# Patient Record
Sex: Male | Born: 1955 | Race: Black or African American | Hispanic: No | Smoking: Former smoker
Health system: Southern US, Community
[De-identification: ages and names within clinical notes are randomized; demographics above are authoritative.]

## PROBLEM LIST (undated history)

## (undated) DIAGNOSIS — D75839 Thrombocytosis, unspecified: Secondary | ICD-10-CM

## (undated) DIAGNOSIS — J449 Chronic obstructive pulmonary disease, unspecified: Secondary | ICD-10-CM

## (undated) DIAGNOSIS — F1911 Other psychoactive substance abuse, in remission: Secondary | ICD-10-CM

## (undated) DIAGNOSIS — I2510 Atherosclerotic heart disease of native coronary artery without angina pectoris: Secondary | ICD-10-CM

## (undated) DIAGNOSIS — K635 Polyp of colon: Secondary | ICD-10-CM

## (undated) DIAGNOSIS — M549 Dorsalgia, unspecified: Secondary | ICD-10-CM

## (undated) DIAGNOSIS — I739 Peripheral vascular disease, unspecified: Secondary | ICD-10-CM

## (undated) DIAGNOSIS — D473 Essential (hemorrhagic) thrombocythemia: Secondary | ICD-10-CM

## (undated) DIAGNOSIS — R569 Unspecified convulsions: Secondary | ICD-10-CM

## (undated) HISTORY — DX: Other psychoactive substance abuse, in remission: F19.11

## (undated) HISTORY — PX: CORONARY ANGIOPLASTY WITH STENT PLACEMENT: SHX49

## (undated) HISTORY — DX: Chronic obstructive pulmonary disease, unspecified: J44.9

## (undated) HISTORY — DX: Thrombocytosis, unspecified: D75.839

## (undated) HISTORY — DX: Essential (hemorrhagic) thrombocythemia: D47.3

## (undated) HISTORY — PX: HAND SURGERY: SHX662

## (undated) HISTORY — DX: Peripheral vascular disease, unspecified: I73.9

## (undated) HISTORY — DX: Polyp of colon: K63.5

---

## 2004-03-01 ENCOUNTER — Emergency Department (HOSPITAL_COMMUNITY): Admission: EM | Admit: 2004-03-01 | Discharge: 2004-03-01 | Payer: Self-pay | Admitting: Emergency Medicine

## 2007-03-17 ENCOUNTER — Emergency Department (HOSPITAL_COMMUNITY): Admission: EM | Admit: 2007-03-17 | Discharge: 2007-03-17 | Payer: Self-pay | Admitting: Emergency Medicine

## 2007-07-25 ENCOUNTER — Emergency Department (HOSPITAL_COMMUNITY): Admission: EM | Admit: 2007-07-25 | Discharge: 2007-07-25 | Payer: Self-pay | Admitting: Emergency Medicine

## 2008-10-08 ENCOUNTER — Emergency Department (HOSPITAL_COMMUNITY): Admission: EM | Admit: 2008-10-08 | Discharge: 2008-10-08 | Payer: Self-pay | Admitting: Emergency Medicine

## 2009-06-02 ENCOUNTER — Inpatient Hospital Stay (HOSPITAL_COMMUNITY): Admission: EM | Admit: 2009-06-02 | Discharge: 2009-06-10 | Payer: Self-pay | Admitting: Emergency Medicine

## 2010-12-26 LAB — CBC
HCT: 35.3 % — ABNORMAL LOW (ref 39.0–52.0)
HCT: 41 % (ref 39.0–52.0)
Hemoglobin: 13.5 g/dL (ref 13.0–17.0)
MCV: 90.8 fL (ref 78.0–100.0)
Platelets: 228 10*3/uL (ref 150–400)
Platelets: 238 10*3/uL (ref 150–400)
RBC: 4.51 MIL/uL (ref 4.22–5.81)
RDW: 14.4 % (ref 11.5–15.5)
WBC: 7.1 10*3/uL (ref 4.0–10.5)
WBC: 8.7 10*3/uL (ref 4.0–10.5)

## 2010-12-26 LAB — MAGNESIUM
Magnesium: 1.8 mg/dL (ref 1.5–2.5)
Magnesium: 2 mg/dL (ref 1.5–2.5)

## 2010-12-26 LAB — BASIC METABOLIC PANEL
BUN: 4 mg/dL — ABNORMAL LOW (ref 6–23)
BUN: 8 mg/dL (ref 6–23)
CO2: 24 mEq/L (ref 19–32)
Chloride: 104 mEq/L (ref 96–112)
Chloride: 100 mEq/L (ref 96–112)
GFR calc Af Amer: >60 mL/min (ref >60)
GFR calc non Af Amer: >60 mL/min (ref >60)
GFR calc non Af Amer: >60 mL/min (ref >60)
Glucose, Bld: 107 mg/dL — ABNORMAL HIGH (ref 70–99)
Potassium: 4.1 mEq/L (ref 3.5–5.1)
Potassium: 3.8 mEq/L (ref 3.5–5.1)
Potassium: 4.1 mEq/L (ref 3.5–5.1)
Sodium: 135 mEq/L (ref 135–145)
Sodium: 135 mEq/L (ref 135–145)
Sodium: 136 mEq/L (ref 135–145)

## 2010-12-26 LAB — GLUCOSE, CAPILLARY: Glucose-Capillary: 153 mg/dL — ABNORMAL HIGH (ref 70–99)

## 2010-12-26 LAB — URINALYSIS, ROUTINE W REFLEX MICROSCOPIC
Ketones, ur: NEGATIVE mg/dL
Nitrite: NEGATIVE
Protein, ur: NEGATIVE mg/dL

## 2010-12-26 LAB — COMPREHENSIVE METABOLIC PANEL
ALT: 46 U/L (ref 0–53)
ALT: 65 U/L — ABNORMAL HIGH (ref 0–53)
AST: 139 U/L — ABNORMAL HIGH (ref 0–37)
Albumin: 3.4 g/dL — ABNORMAL LOW (ref 3.5–5.2)
Alkaline Phosphatase: 77 U/L (ref 39–117)
CO2: 27 mEq/L (ref 19–32)
Chloride: 98 mEq/L (ref 96–112)
Creatinine, Ser: 1.09 mg/dL (ref 0.4–1.5)
GFR calc Af Amer: >60 mL/min (ref >60)
GFR calc Af Amer: >60 mL/min (ref >60)
GFR calc non Af Amer: >60 mL/min (ref >60)
Glucose, Bld: 146 mg/dL — ABNORMAL HIGH (ref 70–99)
Potassium: 3.4 mEq/L — ABNORMAL LOW (ref 3.5–5.1)
Sodium: 132 mEq/L — ABNORMAL LOW (ref 135–145)
Total Bilirubin: 1 mg/dL (ref 0.3–1.2)
Total Protein: 7.4 g/dL (ref 6.0–8.3)

## 2010-12-26 LAB — RPR: RPR Ser Ql: NONREACTIVE

## 2010-12-26 LAB — HIV ANTIBODY (ROUTINE TESTING W REFLEX): HIV: NONREACTIVE

## 2010-12-26 LAB — PHENYTOIN LEVEL, TOTAL: Phenytoin Lvl: 21.5 ug/mL — ABNORMAL HIGH (ref 10.0–20.0)

## 2010-12-26 LAB — DIFFERENTIAL
Basophils Absolute: 0.1 10*3/uL (ref 0.0–0.1)
Eosinophils Absolute: 0 10*3/uL (ref 0.0–0.7)
Eosinophils Relative: 0 % (ref 0–5)
Lymphocytes Relative: 9 % — ABNORMAL LOW (ref 12–46)

## 2010-12-26 LAB — TSH: TSH: 4.32 u[IU]/mL (ref 0.350–4.500)

## 2010-12-26 LAB — LIPID PANEL
LDL Cholesterol: 112 mg/dL — ABNORMAL HIGH (ref 0–99)
Total CHOL/HDL Ratio: 1.9 RATIO
VLDL: 10 mg/dL (ref 0–40)

## 2010-12-26 LAB — RAPID URINE DRUG SCREEN, HOSP PERFORMED
Amphetamines: NOT DETECTED
Barbiturates: NOT DETECTED

## 2011-01-05 LAB — PHENYTOIN LEVEL, TOTAL: Phenytoin Lvl: <2.5 ug/mL — ABNORMAL LOW (ref 10.0–20.0)

## 2011-06-30 LAB — PHENYTOIN LEVEL, TOTAL: Phenytoin Lvl: <2.5 — ABNORMAL LOW

## 2011-07-08 LAB — DIFFERENTIAL
Basophils Absolute: 0
Lymphocytes Relative: 8 — ABNORMAL LOW
Lymphs Abs: 0.6 — ABNORMAL LOW
Neutro Abs: 7.4
Neutrophils Relative %: 88 — ABNORMAL HIGH

## 2011-07-08 LAB — CBC
Platelets: 292
RDW: 16.7 — ABNORMAL HIGH
WBC: 8.3

## 2011-07-08 LAB — BASIC METABOLIC PANEL
BUN: 10
Calcium: 10.3
GFR calc non Af Amer: 48 — ABNORMAL LOW
Glucose, Bld: 101 — ABNORMAL HIGH

## 2011-07-08 LAB — ETHANOL: Alcohol, Ethyl (B): <5

## 2011-07-08 LAB — PHENYTOIN LEVEL, TOTAL: Phenytoin Lvl: <2.5 — ABNORMAL LOW

## 2012-04-22 ENCOUNTER — Emergency Department (HOSPITAL_COMMUNITY): Payer: Medicaid Other

## 2012-04-22 ENCOUNTER — Encounter (HOSPITAL_COMMUNITY): Payer: Self-pay | Admitting: Emergency Medicine

## 2012-04-22 ENCOUNTER — Emergency Department (HOSPITAL_COMMUNITY)
Admission: EM | Admit: 2012-04-22 | Discharge: 2012-04-22 | Disposition: A | Discharge status: Home / Self Care | Payer: Medicaid Other | Attending: Emergency Medicine | Admitting: Emergency Medicine

## 2012-04-22 DIAGNOSIS — S62309A Unspecified fracture of unspecified metacarpal bone, initial encounter for closed fracture: Secondary | ICD-10-CM

## 2012-04-22 DIAGNOSIS — S62319A Displaced fracture of base of unspecified metacarpal bone, initial encounter for closed fracture: Secondary | ICD-10-CM | POA: Insufficient documentation

## 2012-04-22 DIAGNOSIS — W1789XA Other fall from one level to another, initial encounter: Secondary | ICD-10-CM | POA: Insufficient documentation

## 2012-04-22 DIAGNOSIS — S62102A Fracture of unspecified carpal bone, left wrist, initial encounter for closed fracture: Secondary | ICD-10-CM

## 2012-04-22 DIAGNOSIS — S52509A Unspecified fracture of the lower end of unspecified radius, initial encounter for closed fracture: Secondary | ICD-10-CM | POA: Insufficient documentation

## 2012-04-22 DIAGNOSIS — S52609A Unspecified fracture of lower end of unspecified ulna, initial encounter for closed fracture: Secondary | ICD-10-CM | POA: Insufficient documentation

## 2012-04-22 HISTORY — DX: Dorsalgia, unspecified: M54.9

## 2012-04-22 MED ORDER — HYDROMORPHONE HCL 4 MG PO TABS: 4.0000 mg | ORAL_TABLET | ORAL | Status: AC | PRN

## 2012-04-22 MED ORDER — HYDROMORPHONE HCL PF 1 MG/ML IJ SOLN
1.0000 mg | Freq: Once | INTRAMUSCULAR | Status: AC
  Administered 2012-04-22: 1 mg via INTRAMUSCULAR
  Filled 2012-04-22: qty 1

## 2012-04-22 NOTE — Progress Notes (Signed)
Orthopedic Tech Progress Note Patient Details:  Donald Leblanc 1956/07/04 [id number]  Ortho Devices Type of Ortho Device: Arm foam sling;Sugartong splint;Ace wrap Ortho Device/Splint Location: (L) UE Ortho Device/Splint Interventions: Application   [name] 04/22/2012, 6:28 PM

## 2012-04-22 NOTE — ED Notes (Addendum)
C/o L wrist pain, swelling, and deformity since falling off porch 20 min ago. CMS intact.

## 2012-04-22 NOTE — ED Provider Notes (Signed)
History  This chart was scribed for [employer]. Rubin Payor, MD by Erskine Emery. This patient was seen in room TR06C/TR06C and the patient's care was started at 17:21.   CSN: [id number]  Arrival date & time 04/22/12  1646   First MD Initiated Contact with Patient 04/22/12 1721      Chief Complaint  Patient presents with  . Wrist Pain    (Consider location/radiation/quality/duration/timing/severity/associated sxs/prior treatment) HPI Donald Leblanc is a 56 y.o. male who presents to the Emergency Department complaining of left wrist pain, swelling, and deformity since he fell off his porch 20 minutes ago. Pt reports he hit his leg and wrist upon falling but did not hit his head. Pt denies any h/o smoking, drinking, or other injuries.  CMS is intact.   Past Medical History  Diagnosis Date  . Back pain     History reviewed. No pertinent past surgical history.  No family history on file.  History  Substance Use Topics  . Smoking status: Never Smoker   . Smokeless tobacco: Not on file  . Alcohol Use: No      Review of Systems  Constitutional: Negative for fever and chills.  Respiratory: Negative for shortness of breath.   Gastrointestinal: Negative for nausea and vomiting.  Musculoskeletal: Positive for joint swelling.       Left wrist pain, swelling, and deformity.  Neurological: Negative for weakness.    Allergies  Aspirin  Home Medications   Current Outpatient Rx  Name Route Sig Dispense Refill  . HYDROCODONE-ACETAMINOPHEN 10-325 MG PO TABS Oral Take 1 tablet by mouth every 6 (six) hours as needed. For pain    . PHENYTOIN SODIUM EXTENDED 100 MG PO CAPS Oral Take 100 mg by mouth 3 (three) times daily.    Marland Kitchen SIMVASTATIN PO Oral Take 1 tablet by mouth at bedtime.    Marland Kitchen HYDROMORPHONE HCL 4 MG PO TABS Oral Take 1 tablet (4 mg total) by mouth every 4 (four) hours as needed for pain. 20 tablet 0    BP 116/62  Pulse 87  Temp 98 F (36.7 C) (Oral)  Resp 20  SpO2  95%  Physical Exam  Nursing note and vitals reviewed. Constitutional: He is oriented to person, place, and time. He appears well-developed and well-nourished. No distress.  HENT:  Head: Normocephalic and atraumatic.  Eyes: EOM are normal.  Neck: Neck supple. No tracheal deviation present.  Cardiovascular: Normal rate and regular rhythm.   Pulmonary/Chest: Effort normal. No respiratory distress.  Musculoskeletal: Normal range of motion.       Deformity on distal radial wrist. No shoulder tenderness. Strong radial pulse.   Neurological: He is alert and oriented to person, place, and time.  Skin: Skin is warm and dry.       Abrasion to the left elbow.   Psychiatric: He has a normal mood and affect. His behavior is normal.    ED Course  Procedures (including critical care time) DIAGNOSTIC STUDIES: Oxygen Saturation is 95% on room air, adequate by my interpretation.    COORDINATION OF CARE: 17:37--I evaluated the patient and informed him of the fractures present on his x-rays. We discussed a treatment plan including discharge and possible surgery to which the pt agreed.   17:50--I consulted with Dr. Dairl Ponder, orthopaedic surgeon at the [facility], about the pt's fractures and potential surgery.   17:55-I rechecked the pt and told him I would prescribe him something for his pain before discharge. I instructed the pt to  follow up with Dr. Mina Marble at the [facility] in [place].   19:04--I rechecked the pt again and made sure he understood that he needs to follow up with Dr. Mina Marble for surgery.   Labs Reviewed - No data to display Dg Wrist Complete Left  04/22/2012  *RADIOLOGY REPORT*  Clinical Data: History of injury with wrist pain.  LEFT WRIST - COMPLETE 3+ VIEW  Comparison: None.  Findings: There is fracture of the distal metaphysis and epiphysis of the radius with extension to involve the articular surface of the distal radius at the radiocarpal joint.  There is proximal  and dorsal and radial displacement of the major distal fracture fragment.  There is dorsal angulation of the major distal fracture fragment.  There is also a comminuted fracture of the ulnar styloid with slight distal and radial displacement of the small fracture fragments.  There is also fracture of the proximal diaphysis of the third metacarpal at junction with metaphysis.  There is apposition at the fracture site with near anatomic alignment.  No scaphoid fracture is evident.  No dislocation is seen.  IMPRESSION: Fracture of the distal radius.  There is involvement of the articular surface of the radiocarpal joint.  Fracture of the ulnar styloid.  Fracture of the diaphysis of the third metacarpal.  Original Report Authenticated By: Crawford Givens, M.D.     1. Wrist fracture, left   2. Metacarpal bone fracture       MDM   Patient with a fall and left distal radius fracture with intra-articular old involvement. Also ulnar styloid fracture. Also third metacarpal fracture. Patient was immobilized in the ER and discussion with Dr. Mina Marble. Patient will see him in the office on Tuesday and will likely require surgery. Patient feels better after pain medicines and discharged home.   I personally performed the services described in this documentation, which was scribed in my presence. The recorded information has been reviewed and considered.           Juliet Rude. Rubin Payor, MD 04/22/12 [phone]

## 2012-04-22 NOTE — ED Notes (Signed)
Unable to locate pt in triage, wait

## 2012-04-28 ENCOUNTER — Encounter (HOSPITAL_COMMUNITY): Payer: Self-pay | Admitting: Emergency Medicine

## 2012-04-28 ENCOUNTER — Emergency Department (HOSPITAL_COMMUNITY)
Admission: EM | Admit: 2012-04-28 | Discharge: 2012-04-28 | Disposition: A | Discharge status: Home / Self Care | Payer: Medicaid Other | Attending: Emergency Medicine | Admitting: Emergency Medicine

## 2012-04-28 DIAGNOSIS — M25539 Pain in unspecified wrist: Secondary | ICD-10-CM

## 2012-04-28 DIAGNOSIS — Z886 Allergy status to analgesic agent status: Secondary | ICD-10-CM | POA: Insufficient documentation

## 2012-04-28 DIAGNOSIS — Z9889 Other specified postprocedural states: Secondary | ICD-10-CM

## 2012-04-28 DIAGNOSIS — Z4789 Encounter for other orthopedic aftercare: Secondary | ICD-10-CM | POA: Insufficient documentation

## 2012-04-28 MED ORDER — HYDROMORPHONE HCL PF 2 MG/ML IJ SOLN
2.0000 mg | Freq: Once | INTRAMUSCULAR | Status: AC
  Administered 2012-04-28: 2 mg via INTRAMUSCULAR
  Filled 2012-04-28: qty 1

## 2012-04-28 MED ORDER — HYDROMORPHONE HCL 4 MG PO TABS: 4.0000 mg | ORAL_TABLET | ORAL | Status: AC | PRN

## 2012-04-28 NOTE — ED Notes (Signed)
AOZ:HY86<VH> Expected date:04/28/12<BR> Expected time: 3:48 AM<BR> Means of arrival:Ambulance<BR> Comments:<BR> S/P wrist surgery uncontrolled pain

## 2012-04-28 NOTE — ED Notes (Signed)
Pt fingers w/ moderate swelling, warm to touch, capillary refilled ~ 3 seconds. Pt states has kept arm elevated since discharge from hospital yesterday afternoon, got home post-op ~ 1600.

## 2012-04-28 NOTE — ED Provider Notes (Signed)
History     CSN: [id number]  Arrival date & time 04/28/12  0401   First MD Initiated Contact with Patient 04/28/12 [phone]      Chief Complaint  Patient presents with  . Wrist Pain    ORIF left wrist 04/27/12    (Consider location/radiation/quality/duration/timing/severity/associated sxs/prior treatment) HPI Comments: The patient fell off of his front porch yesterday evening and broke wrist.  He was seen at [facility] where he had an orif of the fracture.  He is taking his percocet but is not getting any relief.  Called 911.    Patient is a 56 y.o. male presenting with wrist pain. The history is provided by the patient.  Wrist Pain This is a new problem. The problem occurs constantly. The problem has been gradually worsening. Nothing aggravates the symptoms. Nothing relieves the symptoms. Treatments tried: pain meds  The treatment provided no relief.    Past Medical History  Diagnosis Date  . Back pain     History reviewed. No pertinent past surgical history.  No family history on file.  History  Substance Use Topics  . Smoking status: Never Smoker   . Smokeless tobacco: Not on file  . Alcohol Use: No      Review of Systems  All other systems reviewed and are negative.    Allergies  Aspirin  Home Medications   Current Outpatient Rx  Name Route Sig Dispense Refill  . HYDROMORPHONE HCL 4 MG PO TABS Oral Take 1 tablet (4 mg total) by mouth every 4 (four) hours as needed for pain. 20 tablet 0  . OXYCODONE-ACETAMINOPHEN 5-325 MG PO TABS Oral Take 1 tablet by mouth every 6 (six) hours as needed. Post op pain    . PHENYTOIN SODIUM EXTENDED 100 MG PO CAPS Oral Take 100 mg by mouth 3 (three) times daily.    Marland Kitchen SIMVASTATIN PO Oral Take 1 tablet by mouth at bedtime.      BP 178/94  Pulse 103  Resp 16  SpO2 99%  Physical Exam  Nursing note and vitals reviewed. Constitutional: He is oriented to person, place, and time. He appears well-developed and well-nourished. No distress.   HENT:  Head: Normocephalic and atraumatic.  Neck: Normal range of motion. Neck supple.  Musculoskeletal:       There is a volar splint with web roll and ace on the left hand.  The splint is not circumferential and does not appear to be too tight.  Cap refill is brisk in the fingers and motor and sensation are intact.  Neurological: He is alert and oriented to person, place, and time.  Skin: Skin is warm and dry. He is not diaphoretic.    ED Course  Procedures (including critical care time)  Labs Reviewed - No data to display No results found.   No diagnosis found.    MDM  There are no signs of compartment syndrome.  The splint is not circumferential and does not appear to be too tight.  He will be given dilaudid im for pain control and discharged to home.        Geoffery Lyons, MD 04/28/12 [phone]

## 2012-04-28 NOTE — ED Notes (Signed)
Pt post op from 04/27/12, taking po pain meds at home yesterday evening. Took one oxycodone at 1830, and two at 2245. States is unable to get sufficient pain control so called EMS

## 2012-05-05 ENCOUNTER — Ambulatory Visit: Payer: Medicaid Other | Attending: Orthopedic Surgery | Admitting: Occupational Therapy

## 2012-05-05 DIAGNOSIS — IMO0001 Reserved for inherently not codable concepts without codable children: Secondary | ICD-10-CM | POA: Insufficient documentation

## 2012-05-05 DIAGNOSIS — M25539 Pain in unspecified wrist: Secondary | ICD-10-CM | POA: Insufficient documentation

## 2012-05-05 DIAGNOSIS — M25549 Pain in joints of unspecified hand: Secondary | ICD-10-CM | POA: Insufficient documentation

## 2012-05-12 ENCOUNTER — Encounter: Payer: Medicaid Other | Admitting: Occupational Therapy

## 2013-07-14 ENCOUNTER — Encounter (HOSPITAL_COMMUNITY): Payer: Self-pay | Admitting: Emergency Medicine

## 2013-07-14 ENCOUNTER — Emergency Department (HOSPITAL_COMMUNITY)
Admission: EM | Admit: 2013-07-14 | Discharge: 2013-07-14 | Disposition: A | Discharge status: Home / Self Care | Payer: Medicaid Other | Attending: Emergency Medicine | Admitting: Emergency Medicine

## 2013-07-14 DIAGNOSIS — Y838 Other surgical procedures as the cause of abnormal reaction of the patient, or of later complication, without mention of misadventure at the time of the procedure: Secondary | ICD-10-CM | POA: Insufficient documentation

## 2013-07-14 DIAGNOSIS — T148XXA Other injury of unspecified body region, initial encounter: Secondary | ICD-10-CM

## 2013-07-14 DIAGNOSIS — Z79899 Other long term (current) drug therapy: Secondary | ICD-10-CM | POA: Insufficient documentation

## 2013-07-14 DIAGNOSIS — I2510 Atherosclerotic heart disease of native coronary artery without angina pectoris: Secondary | ICD-10-CM | POA: Insufficient documentation

## 2013-07-14 DIAGNOSIS — Z9861 Coronary angioplasty status: Secondary | ICD-10-CM | POA: Insufficient documentation

## 2013-07-14 DIAGNOSIS — T792XXA Traumatic secondary and recurrent hemorrhage and seroma, initial encounter: Secondary | ICD-10-CM

## 2013-07-14 DIAGNOSIS — Z8739 Personal history of other diseases of the musculoskeletal system and connective tissue: Secondary | ICD-10-CM | POA: Insufficient documentation

## 2013-07-14 DIAGNOSIS — G40909 Epilepsy, unspecified, not intractable, without status epilepticus: Secondary | ICD-10-CM | POA: Insufficient documentation

## 2013-07-14 DIAGNOSIS — IMO0002 Reserved for concepts with insufficient information to code with codable children: Secondary | ICD-10-CM | POA: Insufficient documentation

## 2013-07-14 HISTORY — DX: Atherosclerotic heart disease of native coronary artery without angina pectoris: I25.10

## 2013-07-14 HISTORY — DX: Unspecified convulsions: R56.9

## 2013-07-14 NOTE — ED Notes (Signed)
Pt alert and mentating appropriately upon d/c. Applied gauze to groin area per MD order. Pt given d/c teaching and follow up care instructions. Pt verbalizes understanding and has no further questions upon d/c. Pt ambulatory upon d/c with NAD noted upon d/c. Pt leaving with wife and paperwork.

## 2013-07-14 NOTE — ED Notes (Signed)
Pt states had cath last Thursday and noticed bleeding from site this morning. Pt denies dizziness and lightheadedness. Pt denies fever and n/v/d. Pt denies chest pain and SOB. Pt sore to touch at site. Bleeding noted.

## 2013-07-14 NOTE — ED Notes (Signed)
Dr Loretha Stapler at bedside with ultrasound

## 2013-07-14 NOTE — ED Provider Notes (Signed)
CSN: [id number]     Arrival date & time 07/14/13  4098 History   First MD Initiated Contact with Patient 07/14/13 [phone]     Chief Complaint  Patient presents with  . bleeding from cath site    (Consider location/radiation/quality/duration/timing/severity/associated sxs/prior Treatment) HPI Comments: 57 yo male with recent right lower extremity arterial stenting from a left femoral artery approach presenting with a small amount of bleeding from his left groin which started this morning.  Surgery was 8 days ago.  Denies pain at site, purulent discharge, leg pain in either leg, fevers, or other systemic symptoms.  Nothing has made bleeding better.  Walking seems to make it worse.     Past Medical History  Diagnosis Date  . Back pain   . Seizures   . Coronary artery disease    Past Surgical History  Procedure Laterality Date  . Coronary angioplasty with stent placement     History reviewed. No pertinent family history. History  Substance Use Topics  . Smoking status: Never Smoker   . Smokeless tobacco: Not on file  . Alcohol Use: No    Review of Systems  Constitutional: Negative for fever.  HENT: Negative for congestion.   Respiratory: Negative for cough and shortness of breath.   Cardiovascular: Negative for chest pain.  Gastrointestinal: Negative for nausea, vomiting, abdominal pain and diarrhea.  All other systems reviewed and are negative.    Allergies  Aspirin  Home Medications   Current Outpatient Rx  Name  Route  Sig  Dispense  Refill  . oxyCODONE-acetaminophen (PERCOCET/ROXICET) 5-325 MG per tablet   Oral   Take 1 tablet by mouth every 6 (six) hours as needed. Post op pain         . phenytoin (DILANTIN) 100 MG ER capsule   Oral   Take 100 mg by mouth 3 (three) times daily.         Marland Kitchen SIMVASTATIN PO   Oral   Take 1 tablet by mouth at bedtime.          BP 137/70  Pulse 71  Temp(Src) 97.7 F (36.5 C) (Oral)  Resp 16  SpO2 98% Physical Exam   Nursing note and vitals reviewed. Constitutional: He is oriented to person, place, and time. He appears well-developed and well-nourished. No distress.  HENT:  Head: Normocephalic and atraumatic.  Eyes: Conjunctivae are normal. No scleral icterus.  Neck: Neck supple.  Cardiovascular: Normal rate and intact distal pulses.   Pulmonary/Chest: Effort normal. No stridor. No respiratory distress.  Abdominal: Normal appearance. He exhibits no distension.  Musculoskeletal:  Punctate wound in left inguinal crease from which red blood can be expressed.  Superior to this area is a small, mildly tender mass.  No fluctuance or surrounding erythema.  No induration.  Femoral pulses are difficult to palpate bilaterally.  2+ DP pulses bilaterally.    Neurological: He is alert and oriented to person, place, and time.  Skin: Skin is warm and dry. No rash noted.  Psychiatric: He has a normal mood and affect. His behavior is normal.    ED Course  Procedures (including critical care time)  Labs Review Labs Reviewed - No data to display Imaging Review No results found.  EKG Interpretation   None       MDM   1. Bleeding from wound, initial encounter   2. Hematoma    57 yo male with recent left femoral groin stick 1 week ago, now with small amount of bleeding  from puncture site.  He did a lot of walking yesterday, which maybe prompted bleeding.  He has what appears to be a small hematoma superior to the puncture wound.  He has no signs of infection, local or systemic.  Limited bedside ultrasound confirmed good blood flow through femoral artery.  I discussed case with Dr. Eben Burow, his Vascular Surgeron, via telephone.  He and I do not see an indication for further emergent workup, but pt will follow up for an ultrasound in a few days.  He was given return precautions including worsening bleeding, leg pain, signs of infection, etc.      Candyce Churn, MD 07/14/13 1606

## 2014-09-16 ENCOUNTER — Emergency Department (HOSPITAL_COMMUNITY)
Admission: EM | Admit: 2014-09-16 | Discharge: 2014-09-16 | Disposition: A | Discharge status: Home / Self Care | Payer: Medicaid Other | Attending: Emergency Medicine | Admitting: Emergency Medicine

## 2014-09-16 ENCOUNTER — Encounter (HOSPITAL_COMMUNITY): Payer: Self-pay | Admitting: Nurse Practitioner

## 2014-09-16 DIAGNOSIS — G40909 Epilepsy, unspecified, not intractable, without status epilepticus: Secondary | ICD-10-CM | POA: Insufficient documentation

## 2014-09-16 DIAGNOSIS — Y9389 Activity, other specified: Secondary | ICD-10-CM | POA: Diagnosis not present

## 2014-09-16 DIAGNOSIS — I2510 Atherosclerotic heart disease of native coronary artery without angina pectoris: Secondary | ICD-10-CM | POA: Insufficient documentation

## 2014-09-16 DIAGNOSIS — G8929 Other chronic pain: Secondary | ICD-10-CM | POA: Diagnosis not present

## 2014-09-16 DIAGNOSIS — S39012A Strain of muscle, fascia and tendon of lower back, initial encounter: Secondary | ICD-10-CM | POA: Diagnosis not present

## 2014-09-16 DIAGNOSIS — Z79899 Other long term (current) drug therapy: Secondary | ICD-10-CM | POA: Diagnosis not present

## 2014-09-16 DIAGNOSIS — Y9289 Other specified places as the place of occurrence of the external cause: Secondary | ICD-10-CM | POA: Insufficient documentation

## 2014-09-16 DIAGNOSIS — Y998 Other external cause status: Secondary | ICD-10-CM | POA: Insufficient documentation

## 2014-09-16 DIAGNOSIS — S3992XA Unspecified injury of lower back, initial encounter: Secondary | ICD-10-CM | POA: Diagnosis present

## 2014-09-16 DIAGNOSIS — Z9861 Coronary angioplasty status: Secondary | ICD-10-CM | POA: Diagnosis not present

## 2014-09-16 DIAGNOSIS — Z8739 Personal history of other diseases of the musculoskeletal system and connective tissue: Secondary | ICD-10-CM | POA: Insufficient documentation

## 2014-09-16 MED ORDER — METHOCARBAMOL 500 MG PO TABS: 500.0000 mg | ORAL_TABLET | Freq: Two times a day (BID) | ORAL | Status: DC
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
## 2014-09-16 NOTE — Discharge Instructions (Signed)
You have been diagnosed with having low back strain from recent injury.  Please follow instruction below for further care.    Low Back Strain with Rehab A strain is an injury in which a tendon or muscle is torn. The muscles and tendons of the lower back are vulnerable to strains. However, these muscles and tendons are very strong and require a great force to be injured. Strains are classified into three categories. Grade 1 strains cause pain, but the tendon is not lengthened. Grade 2 strains include a lengthened ligament, due to the ligament being stretched or partially ruptured. With grade 2 strains there is still function, although the function may be decreased. Grade 3 strains involve a complete tear of the tendon or muscle, and function is usually impaired. SYMPTOMS   Pain in the lower back.  Pain that affects one side more than the other.  Pain that gets worse with movement and may be felt in the hip, buttocks, or back of the thigh.  Muscle spasms of the muscles in the back.  Swelling along the muscles of the back.  Loss of strength of the back muscles.  Crackling sound (crepitation) when the muscles are touched. CAUSES  Lower back strains occur when a force is placed on the muscles or tendons that is greater than they can handle. Common causes of injury include:  Prolonged overuse of the muscle-tendon units in the lower back, usually from incorrect posture.  A single violent injury or force applied to the back. RISK INCREASES WITH:  Sports that involve twisting forces on the spine or a lot of bending at the waist (football, rugby, weightlifting, bowling, golf, tennis, speed skating, racquetball, swimming, running, gymnastics, diving).  Poor strength and flexibility.  Failure to warm up properly before activity.  Family history of lower back pain or disk disorders.  Previous back injury or surgery (especially fusion).  Poor posture with lifting, especially heavy  objects.  Prolonged sitting, especially with poor posture. PREVENTION   Learn and use proper posture when sitting or lifting (maintain proper posture when sitting, lift using the knees and legs, not at the waist).  Warm up and stretch properly before activity.  Allow for adequate recovery between workouts.  Maintain physical fitness:  Strength, flexibility, and endurance.  Cardiovascular fitness. PROGNOSIS  If treated properly, lower back strains usually heal within 6 weeks. RELATED COMPLICATIONS   Recurring symptoms, resulting in a chronic problem.  Chronic inflammation, scarring, and partial muscle-tendon tear.  Delayed healing or resolution of symptoms.  Prolonged disability. TREATMENT  Treatment first involves the use of ice and medicine, to reduce pain and inflammation. The use of strengthening and stretching exercises may help reduce pain with activity. These exercises may be performed at home or with a therapist. Severe injuries may require referral to a therapist for further evaluation and treatment, such as ultrasound. Your caregiver may advise that you wear a back brace or corset, to help reduce pain and discomfort. Often, prolonged bed rest results in greater harm then benefit. Corticosteroid injections may be recommended. However, these should be reserved for the most serious cases. It is important to avoid using your back when lifting objects. At night, sleep on your back on a firm mattress with a pillow placed under your knees. If non-surgical treatment is unsuccessful, surgery may be needed.  MEDICATION   If pain medicine is needed, nonsteroidal anti-inflammatory medicines (aspirin and ibuprofen), or other minor pain relievers (acetaminophen), are often advised.  Do not take pain  medicine for 7 days before surgery.  Prescription pain relievers may be given, if your caregiver thinks they are needed. Use only as directed and only as much as you need.  Ointments  applied to the skin may be helpful.  Corticosteroid injections may be given by your caregiver. These injections should be reserved for the most serious cases, because they may only be given a certain number of times. HEAT AND COLD  Cold treatment (icing) should be applied for 10 to 15 minutes every 2 to 3 hours for inflammation and pain, and immediately after activity that aggravates your symptoms. Use ice packs or an ice massage.  Heat treatment may be used before performing stretching and strengthening activities prescribed by your caregiver, physical therapist, or athletic trainer. Use a heat pack or a warm water soak. SEEK MEDICAL CARE IF:   Symptoms get worse or do not improve in 2 to 4 weeks, despite treatment.  You develop numbness, weakness, or loss of bowel or bladder function.  New, unexplained symptoms develop. (Drugs used in treatment may produce side effects.) EXERCISES  RANGE OF MOTION (ROM) AND STRETCHING EXERCISES - Low Back Strain Most people with lower back pain will find that their symptoms get worse with excessive bending forward (flexion) or arching at the lower back (extension). The exercises which will help resolve your symptoms will focus on the opposite motion.  Your physician, physical therapist or athletic trainer will help you determine which exercises will be most helpful to resolve your lower back pain. Do not complete any exercises without first consulting with your caregiver. Discontinue any exercises which make your symptoms worse until you speak to your caregiver.  If you have pain, numbness or tingling which travels down into your buttocks, leg or foot, the goal of the therapy is for these symptoms to move closer to your back and eventually resolve. Sometimes, these leg symptoms will get better, but your lower back pain may worsen. This is typically an indication of progress in your rehabilitation. Be very alert to any changes in your symptoms and the activities  in which you participated in the 24 hours prior to the change. Sharing this information with your caregiver will allow him/her to most efficiently treat your condition.  These exercises may help you when beginning to rehabilitate your injury. Your symptoms may resolve with or without further involvement from your physician, physical therapist or athletic trainer. While completing these exercises, remember:  Restoring tissue flexibility helps normal motion to return to the joints. This allows healthier, less painful movement and activity.  An effective stretch should be held for at least 30 seconds.  A stretch should never be painful. You should only feel a gentle lengthening or release in the stretched tissue. FLEXION RANGE OF MOTION AND STRETCHING EXERCISES: STRETCH - Flexion, Single Knee to Chest   Lie on a firm bed or floor with both legs extended in front of you.  Keeping one leg in contact with the floor, bring your opposite knee to your chest. Hold your leg in place by either grabbing behind your thigh or at your knee.  Pull until you feel a gentle stretch in your lower back. Hold __________ seconds.  Slowly release your grasp and repeat the exercise with the opposite side. Repeat __________ times. Complete this exercise __________ times per day.  STRETCH - Flexion, Double Knee to Chest   Lie on a firm bed or floor with both legs extended in front of you.  Keeping one leg  in contact with the floor, bring your opposite knee to your chest.  Tense your stomach muscles to support your back and then lift your other knee to your chest. Hold your legs in place by either grabbing behind your thighs or at your knees.  Pull both knees toward your chest until you feel a gentle stretch in your lower back. Hold __________ seconds.  Tense your stomach muscles and slowly return one leg at a time to the floor. Repeat __________ times. Complete this exercise __________ times per day.  STRETCH -  Low Trunk Rotation  Lie on a firm bed or floor. Keeping your legs in front of you, bend your knees so they are both pointed toward the ceiling and your feet are flat on the floor.  Extend your arms out to the side. This will stabilize your upper body by keeping your shoulders in contact with the floor.  Gently and slowly drop both knees together to one side until you feel a gentle stretch in your lower back. Hold for __________ seconds.  Tense your stomach muscles to support your lower back as you bring your knees back to the starting position. Repeat the exercise to the other side. Repeat __________ times. Complete this exercise __________ times per day  EXTENSION RANGE OF MOTION AND FLEXIBILITY EXERCISES: STRETCH - Extension, Prone on Elbows   Lie on your stomach on the floor, a bed will be too soft. Place your palms about shoulder width apart and at the height of your head.  Place your elbows under your shoulders. If this is too painful, stack pillows under your chest.  Allow your body to relax so that your hips drop lower and make contact more completely with the floor.  Hold this position for __________ seconds.  Slowly return to lying flat on the floor. Repeat __________ times. Complete this exercise __________ times per day.  RANGE OF MOTION - Extension, Prone Press Ups  Lie on your stomach on the floor, a bed will be too soft. Place your palms about shoulder width apart and at the height of your head.  Keeping your back as relaxed as possible, slowly straighten your elbows while keeping your hips on the floor. You may adjust the placement of your hands to maximize your comfort. As you gain motion, your hands will come more underneath your shoulders.  Hold this position __________ seconds.  Slowly return to lying flat on the floor. Repeat __________ times. Complete this exercise __________ times per day.  RANGE OF MOTION- Quadruped, Neutral Spine   Assume a hands and knees  position on a firm surface. Keep your hands under your shoulders and your knees under your hips. You may place padding under your knees for comfort.  Drop your head and point your tail bone toward the ground below you. This will round out your lower back like an angry cat. Hold this position for __________ seconds.  Slowly lift your head and release your tail bone so that your back sags into a large arch, like an old horse.  Hold this position for __________ seconds.  Repeat this until you feel limber in your lower back.  Now, find your "sweet spot." This will be the most comfortable position somewhere between the two previous positions. This is your neutral spine. Once you have found this position, tense your stomach muscles to support your lower back.  Hold this position for __________ seconds. Repeat __________ times. Complete this exercise __________ times per day.  STRENGTHENING EXERCISES -  Low Back Strain These exercises may help you when beginning to rehabilitate your injury. These exercises should be done near your "sweet spot." This is the neutral, low-back arch, somewhere between fully rounded and fully arched, that is your least painful position. When performed in this safe range of motion, these exercises can be used for people who have either a flexion or extension based injury. These exercises may resolve your symptoms with or without further involvement from your physician, physical therapist or athletic trainer. While completing these exercises, remember:   Muscles can gain both the endurance and the strength needed for everyday activities through controlled exercises.  Complete these exercises as instructed by your physician, physical therapist or athletic trainer. Increase the resistance and repetitions only as guided.  You may experience muscle soreness or fatigue, but the pain or discomfort you are trying to eliminate should never worsen during these exercises. If this pain  does worsen, stop and make certain you are following the directions exactly. If the pain is still present after adjustments, discontinue the exercise until you can discuss the trouble with your caregiver. STRENGTHENING - Deep Abdominals, Pelvic Tilt  Lie on a firm bed or floor. Keeping your legs in front of you, bend your knees so they are both pointed toward the ceiling and your feet are flat on the floor.  Tense your lower abdominal muscles to press your lower back into the floor. This motion will rotate your pelvis so that your tail bone is scooping upwards rather than pointing at your feet or into the floor.  With a gentle tension and even breathing, hold this position for __________ seconds. Repeat __________ times. Complete this exercise __________ times per day.  STRENGTHENING - Abdominals, Crunches   Lie on a firm bed or floor. Keeping your legs in front of you, bend your knees so they are both pointed toward the ceiling and your feet are flat on the floor. Cross your arms over your chest.  Slightly tip your chin down without bending your neck.  Tense your abdominals and slowly lift your trunk high enough to just clear your shoulder blades. Lifting higher can put excessive stress on the lower back and does not further strengthen your abdominal muscles.  Control your return to the starting position. Repeat __________ times. Complete this exercise __________ times per day.  STRENGTHENING - Quadruped, Opposite UE/LE Lift   Assume a hands and knees position on a firm surface. Keep your hands under your shoulders and your knees under your hips. You may place padding under your knees for comfort.  Find your neutral spine and gently tense your abdominal muscles so that you can maintain this position. Your shoulders and hips should form a rectangle that is parallel with the floor and is not twisted.  Keeping your trunk steady, lift your right hand no higher than your shoulder and then your  left leg no higher than your hip. Make sure you are not holding your breath. Hold this position __________ seconds.  Continuing to keep your abdominal muscles tense and your back steady, slowly return to your starting position. Repeat with the opposite arm and leg. Repeat __________ times. Complete this exercise __________ times per day.  STRENGTHENING - Lower Abdominals, Double Knee Lift  Lie on a firm bed or floor. Keeping your legs in front of you, bend your knees so they are both pointed toward the ceiling and your feet are flat on the floor.  Tense your abdominal muscles to brace your  lower back and slowly lift both of your knees until they come over your hips. Be certain not to hold your breath.  Hold __________ seconds. Using your abdominal muscles, return to the starting position in a slow and controlled manner. Repeat __________ times. Complete this exercise __________ times per day.  POSTURE AND BODY MECHANICS CONSIDERATIONS - Low Back Strain Keeping correct posture when sitting, standing or completing your activities will reduce the stress put on different body tissues, allowing injured tissues a chance to heal and limiting painful experiences. The following are general guidelines for improved posture. Your physician or physical therapist will provide you with any instructions specific to your needs. While reading these guidelines, remember:  The exercises prescribed by your provider will help you have the flexibility and strength to maintain correct postures.  The correct posture provides the best environment for your joints to work. All of your joints have less wear and tear when properly supported by a spine with good posture. This means you will experience a healthier, less painful body.  Correct posture must be practiced with all of your activities, especially prolonged sitting and standing. Correct posture is as important when doing repetitive low-stress activities (typing) as it  is when doing a single heavy-load activity (lifting). RESTING POSITIONS Consider which positions are most painful for you when choosing a resting position. If you have pain with flexion-based activities (sitting, bending, stooping, squatting), choose a position that allows you to rest in a less flexed posture. You would want to avoid curling into a fetal position on your side. If your pain worsens with extension-based activities (prolonged standing, working overhead), avoid resting in an extended position such as sleeping on your stomach. Most people will find more comfort when they rest with their spine in a more neutral position, neither too rounded nor too arched. Lying on a non-sagging bed on your side with a pillow between your knees, or on your back with a pillow under your knees will often provide some relief. Keep in mind, being in any one position for a prolonged period of time, no matter how correct your posture, can still lead to stiffness. PROPER SITTING POSTURE In order to minimize stress and discomfort on your spine, you must sit with correct posture. Sitting with good posture should be effortless for a healthy body. Returning to good posture is a gradual process. Many people can work toward this most comfortably by using various supports until they have the flexibility and strength to maintain this posture on their own. When sitting with proper posture, your ears will fall over your shoulders and your shoulders will fall over your hips. You should use the back of the chair to support your upper back. Your lower back will be in a neutral position, just slightly arched. You may place a small pillow or folded towel at the base of your lower back for support.  When working at a desk, create an environment that supports good, upright posture. Without extra support, muscles tire, which leads to excessive strain on joints and other tissues. Keep these recommendations in mind: CHAIR:  A chair should  be able to slide under your desk when your back makes contact with the back of the chair. This allows you to work closely.  The chair's height should allow your eyes to be level with the upper part of your monitor and your hands to be slightly lower than your elbows. BODY POSITION  Your feet should make contact with the floor. If  this is not possible, use a foot rest.  Keep your ears over your shoulders. This will reduce stress on your neck and lower back. INCORRECT SITTING POSTURES  If you are feeling tired and unable to assume a healthy sitting posture, do not slouch or slump. This puts excessive strain on your back tissues, causing more damage and pain. Healthier options include:  Using more support, like a lumbar pillow.  Switching tasks to something that requires you to be upright or walking.  Talking a brief walk.  Lying down to rest in a neutral-spine position. PROLONGED STANDING WHILE SLIGHTLY LEANING FORWARD  When completing a task that requires you to lean forward while standing in one place for a long time, place either foot up on a stationary 2-4 inch high object to help maintain the best posture. When both feet are on the ground, the lower back tends to lose its slight inward curve. If this curve flattens (or becomes too large), then the back and your other joints will experience too much stress, tire more quickly, and can cause pain. CORRECT STANDING POSTURES Proper standing posture should be assumed with all daily activities, even if they only take a few moments, like when brushing your teeth. As in sitting, your ears should fall over your shoulders and your shoulders should fall over your hips. You should keep a slight tension in your abdominal muscles to brace your spine. Your tailbone should point down to the ground, not behind your body, resulting in an over-extended swayback posture.  INCORRECT STANDING POSTURES  Common incorrect standing postures include a forward head,  locked knees and/or an excessive swayback. WALKING Walk with an upright posture. Your ears, shoulders and hips should all line-up. PROLONGED ACTIVITY IN A FLEXED POSITION When completing a task that requires you to bend forward at your waist or lean over a low surface, try to find a way to stabilize 3 out of 4 of your limbs. You can place a hand or elbow on your thigh or rest a knee on the surface you are reaching across. This will provide you more stability so that your muscles do not fatigue as quickly. By keeping your knees relaxed, or slightly bent, you will also reduce stress across your lower back. CORRECT LIFTING TECHNIQUES DO :   Assume a wide stance. This will provide you more stability and the opportunity to get as close as possible to the object which you are lifting.  Tense your abdominals to brace your spine. Bend at the knees and hips. Keeping your back locked in a neutral-spine position, lift using your leg muscles. Lift with your legs, keeping your back straight.  Test the weight of unknown objects before attempting to lift them.  Try to keep your elbows locked down at your sides in order get the best strength from your shoulders when carrying an object.  Always ask for help when lifting heavy or awkward objects. INCORRECT LIFTING TECHNIQUES DO NOT:   Lock your knees when lifting, even if it is a small object.  Bend and twist. Pivot at your feet or move your feet when needing to change directions.  Assume that you can safely pick up even a paper clip without proper posture. Document Released: 09/07/2005 Document Revised: 11/30/2011 Document Reviewed: 12/20/2008 [facility] Patient Information 2015 [place], [place]. This information is not intended to replace advice given to you by your health care provider. Make sure you discuss any questions you have with your health care provider.

## 2014-09-16 NOTE — ED Notes (Signed)
gpd at bedside to talk to pt

## 2014-12-10 ENCOUNTER — Emergency Department (HOSPITAL_COMMUNITY)
Admission: EM | Admit: 2014-12-10 | Discharge: 2014-12-10 | Disposition: A | Discharge status: Home / Self Care | Payer: Medicaid Other | Attending: Emergency Medicine | Admitting: Emergency Medicine

## 2014-12-10 ENCOUNTER — Encounter (HOSPITAL_COMMUNITY): Payer: Self-pay | Admitting: Emergency Medicine

## 2014-12-10 ENCOUNTER — Emergency Department (HOSPITAL_COMMUNITY): Payer: Medicaid Other

## 2014-12-10 DIAGNOSIS — R05 Cough: Secondary | ICD-10-CM | POA: Diagnosis present

## 2014-12-10 DIAGNOSIS — R0981 Nasal congestion: Secondary | ICD-10-CM | POA: Diagnosis not present

## 2014-12-10 DIAGNOSIS — I2510 Atherosclerotic heart disease of native coronary artery without angina pectoris: Secondary | ICD-10-CM | POA: Insufficient documentation

## 2014-12-10 DIAGNOSIS — Z87891 Personal history of nicotine dependence: Secondary | ICD-10-CM | POA: Diagnosis not present

## 2014-12-10 DIAGNOSIS — G40909 Epilepsy, unspecified, not intractable, without status epilepticus: Secondary | ICD-10-CM | POA: Insufficient documentation

## 2014-12-10 DIAGNOSIS — Z7982 Long term (current) use of aspirin: Secondary | ICD-10-CM | POA: Diagnosis not present

## 2014-12-10 DIAGNOSIS — Z79899 Other long term (current) drug therapy: Secondary | ICD-10-CM | POA: Diagnosis not present

## 2014-12-10 DIAGNOSIS — R059 Cough, unspecified: Secondary | ICD-10-CM

## 2014-12-10 DIAGNOSIS — Z7901 Long term (current) use of anticoagulants: Secondary | ICD-10-CM | POA: Diagnosis not present

## 2014-12-10 MED ORDER — BENZONATATE 100 MG PO CAPS
200.0000 mg | ORAL_CAPSULE | Freq: Once | ORAL | Status: AC
  Administered 2014-12-10: 200 mg via ORAL
  Filled 2014-12-10: qty 2

## 2014-12-10 MED ORDER — IPRATROPIUM-ALBUTEROL 0.5-2.5 (3) MG/3ML IN SOLN
3.0000 mL | RESPIRATORY_TRACT | Status: DC
  Administered 2014-12-10: 3 mL via RESPIRATORY_TRACT
  Filled 2014-12-10: qty 3

## 2014-12-10 NOTE — ED Notes (Signed)
Pt presents to ED with c/o persistent dry cough and nasal congestion for about 2 weeks. Denies fever or chills, airway intact. Alerts and oriented x4 at this time.

## 2014-12-10 NOTE — ED Provider Notes (Signed)
CSN: [id number]     Arrival date & time 12/10/14  0249 History  This chart was scribed for Everlene Balls, MD by Eustaquio Maize, ED Scribe. This patient was seen in room A09C/A09C and the patient's care was started at 3:11 AM.    Chief Complaint  Patient presents with  . Nasal Congestion  . Cough   The history is provided by the patient and a relative. No language interpreter was used.     HPI Comments: Donald Leblanc is a 59 y.o. male with PMHx COPD who presents to the Emergency Department complaining of persistent dry cough that began 2 weeks ago. Pt's sister states that the cough is worse at night. Pt also complains of shortness of breath and mild chest pain. Pt states that the chest pain is only present during coughing. He reports that these symptoms do not feel like his usual COPD. He denies fever, diaphoresis, chills, myalgias, or any other symptoms. Pt also denies recently being sick. Pt mentions that he did not receive his flu vaccine this year. Pt has also not being using his albuterol inhaler.     Past Medical History  Diagnosis Date  . Back pain   . Seizures   . Coronary artery disease    Past Surgical History  Procedure Laterality Date  . Coronary angioplasty with stent placement     History reviewed. No pertinent family history. History  Substance Use Topics  . Smoking status: Former Smoker    Types: Cigarettes  . Smokeless tobacco: Not on file  . Alcohol Use: No    Review of Systems  10 Systems reviewed and all are negative for acute change except as noted in the HPI.    Allergies  Aspirin  Home Medications   Prior to Admission medications   Medication Sig Start Date End Date Taking? Authorizing Provider  albuterol (PROVENTIL HFA;VENTOLIN HFA) 108 (90 BASE) MCG/ACT inhaler Inhale 2 puffs into the lungs every 6 (six) hours as needed for wheezing or shortness of breath.   Yes Historical Provider, MD  amLODipine (NORVASC) 10 MG tablet Take 10 mg by mouth  daily.   Yes Historical Provider, MD  aspirin EC 81 MG tablet Take 81 mg by mouth daily.   Yes Historical Provider, MD  atorvastatin (LIPITOR) 80 MG tablet Take 80 mg by mouth daily.   Yes Historical Provider, MD  clopidogrel (PLAVIX) 75 MG tablet Take 75 mg by mouth daily.   Yes Historical Provider, MD  HYDROcodone-acetaminophen (NORCO) 10-325 MG per tablet Take 1 tablet by mouth every 6 (six) hours as needed for moderate pain.   Yes Historical Provider, MD  isosorbide mononitrate (IMDUR) 60 MG 24 hr tablet Take 60 mg by mouth daily.   Yes Historical Provider, MD  metoprolol (LOPRESSOR) 50 MG tablet Take 50 mg by mouth 2 (two) times daily.   Yes Historical Provider, MD  potassium chloride SA (K-DUR,KLOR-CON) 20 MEQ tablet Take 20 mEq by mouth daily.   Yes Historical Provider, MD  methocarbamol (ROBAXIN) 500 MG tablet Take 1 tablet (500 mg total) by mouth 2 (two) times daily. Patient not taking: Reported on 12/10/2014 09/16/14   Domenic Moras, PA-C  oxyCODONE-acetaminophen (PERCOCET/ROXICET) 5-325 MG per tablet Take 1 tablet by mouth every 6 (six) hours as needed. Post op pain    Historical Provider, MD  phenytoin (DILANTIN) 100 MG ER capsule Take 100 mg by mouth 3 (three) times daily.    Historical Provider, MD  SIMVASTATIN PO Take 1 tablet by  mouth at bedtime.    Historical Provider, MD   Triage Vitals: Pulse 88  Temp(Src) 97.8 F (36.6 C) (Oral)  Resp 18  Ht 5\' 10"  (1.778 m)  Wt 175 lb (79.379 kg)  BMI 25.11 kg/m2  SpO2 96%   Physical Exam  Constitutional: He is oriented to person, place, and time. Vital signs are normal. He appears well-developed and well-nourished.  Non-toxic appearance. He does not appear ill. No distress.  HENT:  Head: Normocephalic and atraumatic.  Nose: Nose normal.  Mouth/Throat: Oropharynx is clear and moist. No oropharyngeal exudate.  Eyes: Conjunctivae and EOM are normal. Pupils are equal, round, and reactive to light. No scleral icterus.  2 mm pupils  bilaterally.   Neck: Normal range of motion. Neck supple. No tracheal deviation, no edema, no erythema and normal range of motion present. No thyroid mass and no thyromegaly present.  Cardiovascular: Normal rate, regular rhythm, S1 normal, S2 normal, normal heart sounds, intact distal pulses and normal pulses.  Exam reveals no gallop and no friction rub.   No murmur heard. Pulses:      Radial pulses are 2+ on the right side, and 2+ on the left side.       Dorsalis pedis pulses are 2+ on the right side, and 2+ on the left side.  Pulmonary/Chest: Effort normal and breath sounds normal. No respiratory distress. He has no wheezes. He has no rhonchi. He has no rales.  Frequent cough.   Abdominal: Soft. Normal appearance and bowel sounds are normal. He exhibits no distension, no ascites and no mass. There is no hepatosplenomegaly. There is no tenderness. There is no rebound, no guarding and no CVA tenderness.  Musculoskeletal: Normal range of motion. He exhibits no edema or tenderness.  Lymphadenopathy:    He has no cervical adenopathy.  Neurological: He is alert and oriented to person, place, and time. He has normal strength. No cranial nerve deficit or sensory deficit.  Skin: Skin is warm, dry and intact. No petechiae and no rash noted. He is not diaphoretic. No erythema. No pallor.  Psychiatric: He has a normal mood and affect. His behavior is normal. Judgment normal.  Nursing note and vitals reviewed.   ED Course  Procedures (including critical care time)  DIAGNOSTIC STUDIES: Oxygen Saturation is 96% on RA, normal by my interpretation.    COORDINATION OF CARE: 3:14 AM-Discussed treatment plan which includes CXR and breathing treatment with pt at bedside and pt agreed to plan.   Labs Review Labs Reviewed - No data to display  Imaging Review Dg Chest 2 View  12/10/2014   CLINICAL DATA:  Nonproductive cough for 2 weeks, worse at night. Dyspnea. Mild chest.  EXAM: CHEST  2 VIEW   COMPARISON:  06/02/2009  FINDINGS: There is stable scarring and pleural thickening in the right apex. The lungs are otherwise clear. Heart size is normal. Pulmonary vasculature is normal. There are no pleural effusions. Hilar and mediastinal contours are unremarkable and unchanged.  IMPRESSION: No active cardiopulmonary disease.   Electronically Signed   By: Andreas Newport M.D.   On: 12/10/2014 03:47     EKG Interpretation None      MDM   Final diagnoses:  Cough   patient presents emergency department for chronic cough going on for 2 weeks. Chest x-ray does not show pneumonia. Patient has history of COPD and his cough is worse at night. This is likely bronchospasm. He was told to use his inhaler at night to see if  that helps. He was given a breathing treatment in the emergency department as well as Tessalon Perles. His vital signs within his normal limits he is safe for discharge and primary care follow-up within 3 days.   I personally performed the services described in this documentation, which was scribed in my presence. The recorded information has been reviewed and is accurate.     Everlene Balls, MD 12/10/14 [phone]

## 2014-12-10 NOTE — Discharge Instructions (Signed)
Cough, Adult Donald Leblanc, your chest xray does not show pneumonia.  Follow up with a primary care physician within 3 days for continued management of your cough.  Try using your albuterol inhaler at night to help with the coughing.  If symptoms worsen come back to the ED immediately.  Thank you.  A cough is a reflex. It helps you clear your throat and airways. A cough can help heal your body. A cough can last 2 or 3 weeks (acute) or may last more than 8 weeks (chronic). Some common causes of a cough can include an infection, allergy, or a cold. HOME CARE  Only take medicine as told by your doctor.  If given, take your medicines (antibiotics) as told. Finish them even if you start to feel better.  Use a cold steam vaporizer or humidifier in your home. This can help loosen thick spit (secretions).  Sleep so you are almost sitting up (semi-upright). Use pillows to do this. This helps reduce coughing.  Rest as needed.  Stop smoking if you smoke. GET HELP RIGHT AWAY IF:  You have yellowish-white fluid (pus) in your thick spit.  Your cough gets worse.  Your medicine does not reduce coughing, and you are losing sleep.  You cough up blood.  You have trouble breathing.  Your pain gets worse and medicine does not help.  You have a fever. MAKE SURE YOU:   Understand these instructions.  Will watch your condition.  Will get help right away if you are not doing well or get worse. Document Released: 05/21/2011 Document Revised: 01/22/2014 Document Reviewed: 05/21/2011 [facility] Patient Information 2015 [place], [place]. This information is not intended to replace advice given to you by your health care provider. Make sure you discuss any questions you have with your health care provider.

## 2015-10-11 ENCOUNTER — Encounter (HOSPITAL_COMMUNITY): Payer: Self-pay | Admitting: Emergency Medicine

## 2015-10-11 ENCOUNTER — Emergency Department (HOSPITAL_COMMUNITY)
Admission: EM | Admit: 2015-10-11 | Discharge: 2015-10-11 | Disposition: A | Discharge status: Home / Self Care | Payer: Medicaid Other | Attending: Emergency Medicine | Admitting: Emergency Medicine

## 2015-10-11 ENCOUNTER — Emergency Department (HOSPITAL_COMMUNITY): Payer: Medicaid Other

## 2015-10-11 DIAGNOSIS — Z79899 Other long term (current) drug therapy: Secondary | ICD-10-CM | POA: Diagnosis not present

## 2015-10-11 DIAGNOSIS — J209 Acute bronchitis, unspecified: Secondary | ICD-10-CM | POA: Diagnosis not present

## 2015-10-11 DIAGNOSIS — J40 Bronchitis, not specified as acute or chronic: Secondary | ICD-10-CM

## 2015-10-11 DIAGNOSIS — Z7982 Long term (current) use of aspirin: Secondary | ICD-10-CM | POA: Insufficient documentation

## 2015-10-11 DIAGNOSIS — R05 Cough: Secondary | ICD-10-CM | POA: Diagnosis present

## 2015-10-11 DIAGNOSIS — I2510 Atherosclerotic heart disease of native coronary artery without angina pectoris: Secondary | ICD-10-CM | POA: Diagnosis not present

## 2015-10-11 DIAGNOSIS — Z87891 Personal history of nicotine dependence: Secondary | ICD-10-CM | POA: Diagnosis not present

## 2015-10-11 MED ORDER — AZITHROMYCIN 250 MG PO TABS: 250.0000 mg | ORAL_TABLET | Freq: Every day | ORAL | Status: DC

## 2015-10-11 MED ORDER — BENZONATATE 100 MG PO CAPS: 100.0000 mg | ORAL_CAPSULE | Freq: Three times a day (TID) | ORAL | Status: DC | PRN

## 2015-10-11 MED ORDER — BENZONATATE 100 MG PO CAPS
100.0000 mg | ORAL_CAPSULE | Freq: Once | ORAL | Status: AC
  Administered 2015-10-11: 100 mg via ORAL
  Filled 2015-10-11: qty 1

## 2015-10-11 MED ORDER — ALBUTEROL SULFATE HFA 108 (90 BASE) MCG/ACT IN AERS: 2.0000 | INHALATION_SPRAY | RESPIRATORY_TRACT | Status: DC | PRN

## 2015-10-11 NOTE — Discharge Instructions (Signed)

## 2015-10-11 NOTE — ED Notes (Signed)
Pt c/o productive cough onset x 3 days. Pt reports yellow colored phlegm.

## 2015-10-11 NOTE — ED Notes (Signed)
Patient transported to X-ray 

## 2015-10-11 NOTE — ED Provider Notes (Signed)
CSN: [id number]     Arrival date & time 10/11/15  [id number] History   First MD Initiated Contact with Patient 10/11/15 [phone]     Chief Complaint  Patient presents with  . URI  . Cough     (Consider location/radiation/quality/duration/timing/severity/associated sxs/prior Treatment) Patient is a 60 y.o. male presenting with URI and cough. The history is provided by the patient.  URI Presenting symptoms: cough   Presenting symptoms: no congestion, no ear pain, no facial pain, no fatigue, no fever, no rhinorrhea and no sore throat   Severity:  Moderate Onset quality:  Gradual Duration:  3 days Timing:  Constant Progression:  Worsening Chronicity:  New Relieved by:  Decongestant Worsened by:  Nothing tried Ineffective treatments:  None tried Associated symptoms: wheezing   Associated symptoms: no neck pain, no sneezing and no swollen glands   Risk factors: chronic respiratory disease (COPD)   Risk factors: no diabetes mellitus, no immunosuppression and no sick contacts   Cough Cough characteristics:  Productive (yellow; increased sputum production) Smoker: no (former)   Context: smoke exposure (second hand smoke)   Associated symptoms: shortness of breath and wheezing   Associated symptoms: no chest pain, no chills, no ear fullness, no ear pain, no fever, no rhinorrhea, no sinus congestion and no sore throat     Past Medical History  Diagnosis Date  . Back pain   . Seizures ([place])   . Coronary artery disease    Past Surgical History  Procedure Laterality Date  . Coronary angioplasty with stent placement     No family history on file. Social History  Substance Use Topics  . Smoking status: Former Smoker    Types: Cigarettes  . Smokeless tobacco: None  . Alcohol Use: No    Review of Systems  Constitutional: Negative for fever, chills and fatigue.  HENT: Negative for congestion, ear pain, rhinorrhea, sneezing and sore throat.   Respiratory: Positive for cough, shortness of  breath and wheezing.   Cardiovascular: Negative for chest pain.  Gastrointestinal: Negative for nausea, vomiting and diarrhea.  Musculoskeletal: Negative for neck pain and neck stiffness.  All other systems reviewed and are negative.     Allergies  Review of patient's allergies indicates no known allergies.  Home Medications   Prior to Admission medications   Medication Sig Start Date End Date Taking? Authorizing Provider  amLODipine (NORVASC) 10 MG tablet Take 10 mg by mouth daily.   Yes Historical Provider, MD  aspirin EC 81 MG tablet Take 81 mg by mouth daily.   Yes Historical Provider, MD  atorvastatin (LIPITOR) 80 MG tablet Take 80 mg by mouth daily.   Yes Historical Provider, MD  clopidogrel (PLAVIX) 75 MG tablet Take 75 mg by mouth daily.   Yes Historical Provider, MD  HYDROcodone-acetaminophen (NORCO) 10-325 MG per tablet Take 1 tablet by mouth every 6 (six) hours as needed for moderate pain.   Yes Historical Provider, MD  isosorbide mononitrate (IMDUR) 60 MG 24 hr tablet Take 60 mg by mouth daily.   Yes Historical Provider, MD  metoprolol (LOPRESSOR) 50 MG tablet Take 50 mg by mouth 2 (two) times daily.   Yes Historical Provider, MD  phenytoin (DILANTIN) 100 MG ER capsule Take 100 mg by mouth 3 (three) times daily.   Yes Historical Provider, MD  potassium chloride SA (K-DUR,KLOR-CON) 20 MEQ tablet Take 20 mEq by mouth daily.   Yes Historical Provider, MD  albuterol (PROVENTIL HFA;VENTOLIN HFA) 108 (90 Base) MCG/ACT inhaler Inhale 2  puffs into the lungs every 4 (four) hours as needed for wheezing or shortness of breath. 10/11/15   Gloriann Loan, PA-C  azithromycin (ZITHROMAX) 250 MG tablet Take 1 tablet (250 mg total) by mouth daily. Take first 2 tablets together, then 1 every day until finished. 10/11/15   Gloriann Loan, PA-C  benzonatate (TESSALON) 100 MG capsule Take 1 capsule (100 mg total) by mouth 3 (three) times daily as needed for cough. 10/11/15   Gloriann Loan, PA-C  methocarbamol  (ROBAXIN) 500 MG tablet Take 1 tablet (500 mg total) by mouth 2 (two) times daily. Patient not taking: Reported on 12/10/2014 09/16/14   Domenic Moras, PA-C  oxyCODONE-acetaminophen (PERCOCET/ROXICET) 5-325 MG per tablet Take 1 tablet by mouth every 6 (six) hours as needed. Post op pain    Historical Provider, MD  SIMVASTATIN PO Take 1 tablet by mouth at bedtime.    Historical Provider, MD   BP 132/73 mmHg  Pulse 82  Temp(Src) 97.8 F (36.6 C) (Oral)  Resp 16  Ht 5\' 10"  (1.778 m)  Wt 81.647 kg  BMI 25.83 kg/m2  SpO2 95% Physical Exam  Constitutional: He is oriented to person, place, and time. He appears well-developed and well-nourished.  Non-toxic appearance. He does not have a sickly appearance. He does not appear ill.  Pt appears non-toxic or septic appearing.   HENT:  Head: Normocephalic and atraumatic.  Mouth/Throat: Oropharynx is clear and moist.  Eyes: Conjunctivae are normal. Pupils are equal, round, and reactive to light.  Neck: Normal range of motion. Neck supple.  Cardiovascular: Normal rate, regular rhythm and normal heart sounds.   No murmur heard. Pulmonary/Chest: Effort normal. No accessory muscle usage or stridor. No respiratory distress. He has no decreased breath sounds. He has wheezes (faint) in the left middle field. He has no rhonchi. He has no rales.  No labored breathing, audible wheezing, or signs of respiratory distress.  Oxygen saturation 95% on room air.  Abdominal: Soft. Bowel sounds are normal. He exhibits no distension. There is no tenderness.  Musculoskeletal: Normal range of motion.  Lymphadenopathy:    He has no cervical adenopathy.  Neurological: He is alert and oriented to person, place, and time.  Speech clear without dysarthria.  Skin: Skin is warm and dry.  Psychiatric: He has a normal mood and affect. His behavior is normal.    ED Course  Procedures (including critical care time) Labs Review Labs Reviewed - No data to display  Imaging  Review Dg Chest 2 View  10/11/2015  CLINICAL DATA:  Shortness of breath, cough. EXAM: CHEST  2 VIEW COMPARISON:  December 10, 2014. FINDINGS: The heart size and mediastinal contours are within normal limits. No pneumothorax or pleural effusion is noted. Stable right apical scarring and pleural thickening is noted. No acute pulmonary disease is noted. The visualized skeletal structures are unremarkable. IMPRESSION: No active cardiopulmonary disease. Electronically Signed   By: Marijo Conception, M.D.   On: 10/11/2015 08:20   I have personally reviewed and evaluated these images and lab results as part of my medical decision-making.   EKG Interpretation None      MDM   Final diagnoses:  Bronchitis    Patient presents with symptoms consistent with bronchitis, likely acute exacerbation of chronic bronchitis.  No signs of respiratory distress or hypoxia. Patient's oxygen saturation 95% on room air with no labored breathing or audible wheezes. Patient appears nontoxic or septic appearing. On exam, faint wheezing heard in left middle lung field; otherwise,  clear to auscultation. Will obtain chest x-ray to rule out pneumonia. Anticipate discharge with albuterol inhaler and azithromycin given patient's increased dyspnea, increased sputum volume, and increased sputum purulence. I do not think patient warrants by mouth) prednisone at this time. Evaluation does not show pathology requiring ongoing emergent intervention or admission. Pt is hemodynamically stable and mentating appropriately. Discussed findings/results and plan with patient/guardian, who agrees with plan. All questions answered. Return precautions discussed and outpatient follow up given.      Gloriann Loan, PA-C 10/11/15 [place], MD 10/11/15 [phone]

## 2015-10-11 NOTE — ED Notes (Signed)
As this RN was going over pt medication list, Pt reports that he has not taken his seizure medication for a couple days. Pt reports that he has the medication "just haven't taken it for a couple days." Informed pt importance of taking his medications as prescribed. Fast track provider informed of conversation.

## 2015-11-30 ENCOUNTER — Encounter (HOSPITAL_COMMUNITY): Payer: Self-pay

## 2015-11-30 ENCOUNTER — Emergency Department (HOSPITAL_COMMUNITY): Payer: Medicaid Other

## 2015-11-30 ENCOUNTER — Emergency Department (HOSPITAL_COMMUNITY)
Admission: EM | Admit: 2015-11-30 | Discharge: 2015-11-30 | Disposition: A | Discharge status: Home / Self Care | Payer: Medicaid Other | Attending: Emergency Medicine | Admitting: Emergency Medicine

## 2015-11-30 DIAGNOSIS — J189 Pneumonia, unspecified organism: Secondary | ICD-10-CM

## 2015-11-30 DIAGNOSIS — Z87891 Personal history of nicotine dependence: Secondary | ICD-10-CM | POA: Diagnosis not present

## 2015-11-30 DIAGNOSIS — G40909 Epilepsy, unspecified, not intractable, without status epilepticus: Secondary | ICD-10-CM | POA: Diagnosis not present

## 2015-11-30 DIAGNOSIS — J159 Unspecified bacterial pneumonia: Secondary | ICD-10-CM | POA: Insufficient documentation

## 2015-11-30 DIAGNOSIS — Z792 Long term (current) use of antibiotics: Secondary | ICD-10-CM | POA: Diagnosis not present

## 2015-11-30 DIAGNOSIS — Z7901 Long term (current) use of anticoagulants: Secondary | ICD-10-CM | POA: Diagnosis not present

## 2015-11-30 DIAGNOSIS — Z79899 Other long term (current) drug therapy: Secondary | ICD-10-CM | POA: Insufficient documentation

## 2015-11-30 DIAGNOSIS — Z9861 Coronary angioplasty status: Secondary | ICD-10-CM | POA: Insufficient documentation

## 2015-11-30 DIAGNOSIS — J441 Chronic obstructive pulmonary disease with (acute) exacerbation: Secondary | ICD-10-CM | POA: Diagnosis not present

## 2015-11-30 DIAGNOSIS — I2510 Atherosclerotic heart disease of native coronary artery without angina pectoris: Secondary | ICD-10-CM | POA: Insufficient documentation

## 2015-11-30 DIAGNOSIS — R05 Cough: Secondary | ICD-10-CM | POA: Diagnosis present

## 2015-11-30 DIAGNOSIS — Z7982 Long term (current) use of aspirin: Secondary | ICD-10-CM | POA: Insufficient documentation

## 2015-11-30 MED ORDER — HYDROCOD POLST-CPM POLST ER 10-8 MG/5ML PO SUER
5.0000 mL | Freq: Once | ORAL | Status: AC
  Administered 2015-11-30: 5 mL via ORAL
  Filled 2015-11-30: qty 5

## 2015-11-30 MED ORDER — BENZONATATE 100 MG PO CAPS: 100.0000 mg | ORAL_CAPSULE | Freq: Three times a day (TID) | ORAL | Status: DC

## 2015-11-30 MED ORDER — LEVOFLOXACIN 750 MG PO TABS: 750.0000 mg | ORAL_TABLET | Freq: Every day | ORAL | Status: DC

## 2015-11-30 MED ORDER — PREDNISONE 20 MG PO TABS: 40.0000 mg | ORAL_TABLET | Freq: Every day | ORAL | Status: DC

## 2015-11-30 NOTE — ED Notes (Signed)
Pt. Having a cough for 5 day. Productive cough. Yellow. Sputumn.  Denies fever or body aches.  Pt. Stated, "I feel like crap"  Skin is warm and dry.

## 2015-11-30 NOTE — ED Provider Notes (Signed)
CSN: [id number]     Arrival date & time 11/30/15  [id number] History  By signing my name below, I, Nicole Kindred, attest that this documentation has been prepared under the direction and in the presence of Twylah Bennetts, PA-C.  Electronically Signed: Nicole Kindred, ED Scribe 11/30/2015 at 11:37 AM.   Chief Complaint  Patient presents with  . Cough    The history is provided by the patient. No language interpreter was used.   HPI Comments: Donald Leblanc is a 60 y.o. male with PMHx of back pain, COPD, seizures, and CAD who presents to the Emergency Department complaining of gradual onset, constant, productive cough, onset one week ago. No associated symptoms noted. Pt has taken mucinex for two days with minimal relief to symptoms. No other worsening or alleviating factors noted. Pt denies fever, rhinorrhea, sore throat, nausea, vomiting, shortness of breath or any other pertinent symptoms. Pt has albuterol inhaler that he has used at home. Pt is former smoker. Had similar symptoms 1 month ago, treated with zithromax, states improved but not completely   Past Medical History  Diagnosis Date  . Back pain   . Seizures ([place])   . Coronary artery disease    Past Surgical History  Procedure Laterality Date  . Coronary angioplasty with stent placement     No family history on file. Social History  Substance Use Topics  . Smoking status: Former Smoker    Types: Cigarettes  . Smokeless tobacco: None  . Alcohol Use: No    Review of Systems  Constitutional: Negative for fever and chills.  HENT: Negative for congestion, rhinorrhea and sore throat.   Respiratory: Positive for cough.   Cardiovascular: Negative.   Gastrointestinal: Negative.  Negative for nausea and vomiting.  Musculoskeletal: Positive for myalgias.      Allergies  Review of patient's allergies indicates no known allergies.  Home Medications   Prior to Admission medications   Medication Sig Start Date End  Date Taking? Authorizing Provider  albuterol (PROVENTIL HFA;VENTOLIN HFA) 108 (90 Base) MCG/ACT inhaler Inhale 2 puffs into the lungs every 4 (four) hours as needed for wheezing or shortness of breath. 10/11/15   Kayla Rose, PA-C  amLODipine (NORVASC) 10 MG tablet Take 10 mg by mouth daily.    Historical Provider, MD  aspirin EC 81 MG tablet Take 81 mg by mouth daily.    Historical Provider, MD  atorvastatin (LIPITOR) 80 MG tablet Take 80 mg by mouth daily.    Historical Provider, MD  azithromycin (ZITHROMAX) 250 MG tablet Take 1 tablet (250 mg total) by mouth daily. Take first 2 tablets together, then 1 every day until finished. 10/11/15   Gloriann Loan, PA-C  benzonatate (TESSALON) 100 MG capsule Take 1 capsule (100 mg total) by mouth 3 (three) times daily as needed for cough. 10/11/15   Gloriann Loan, PA-C  clopidogrel (PLAVIX) 75 MG tablet Take 75 mg by mouth daily.    Historical Provider, MD  HYDROcodone-acetaminophen (NORCO) 10-325 MG per tablet Take 1 tablet by mouth every 6 (six) hours as needed for moderate pain.    Historical Provider, MD  isosorbide mononitrate (IMDUR) 60 MG 24 hr tablet Take 60 mg by mouth daily.    Historical Provider, MD  methocarbamol (ROBAXIN) 500 MG tablet Take 1 tablet (500 mg total) by mouth 2 (two) times daily. Patient not taking: Reported on 12/10/2014 09/16/14   Domenic Moras, PA-C  metoprolol (LOPRESSOR) 50 MG tablet Take 50 mg by mouth 2 (two) times daily.  Historical Provider, MD  oxyCODONE-acetaminophen (PERCOCET/ROXICET) 5-325 MG per tablet Take 1 tablet by mouth every 6 (six) hours as needed. Post op pain    Historical Provider, MD  phenytoin (DILANTIN) 100 MG ER capsule Take 100 mg by mouth 3 (three) times daily.    Historical Provider, MD  potassium chloride SA (K-DUR,KLOR-CON) 20 MEQ tablet Take 20 mEq by mouth daily.    Historical Provider, MD  SIMVASTATIN PO Take 1 tablet by mouth at bedtime.    Historical Provider, MD   BP 134/75 mmHg  Pulse 87  Temp(Src)  98.5 F (36.9 C) (Oral)  Resp 18  Ht 5' 10.5" (1.791 m)  Wt 185 lb 1 oz (83.944 kg)  BMI 26.17 kg/m2  SpO2 95% Physical Exam  Constitutional: He is oriented to person, place, and time. He appears well-developed and well-nourished.  HENT:  Head: Normocephalic.  Right Ear: External ear normal.  Left Ear: External ear normal.  Nose: Nose normal.  Mouth/Throat: Oropharynx is clear and moist.  Eyes: EOM are normal.  Neck: Normal range of motion. Neck supple.  Cardiovascular: Normal rate, regular rhythm and normal heart sounds.   Pulmonary/Chest: Effort normal. No respiratory distress. He has wheezes. He has no rales.  Mild end expiratory wheezes bilaterally  Abdominal: He exhibits no distension.  Musculoskeletal: Normal range of motion.  Neurological: He is alert and oriented to person, place, and time.  Psychiatric: He has a normal mood and affect.  Nursing note and vitals reviewed.   ED Course  Procedures (including critical care time) DIAGNOSTIC STUDIES: Oxygen Saturation is 95% on RA, adequate by my interpretation.    COORDINATION OF CARE: 9:47 AM-Discussed treatment plan which includes CXR and tussionex with pt at bedside and pt agreed to plan.   Labs Review Labs Reviewed - No data to display  Imaging Review Dg Chest 2 View  11/30/2015  CLINICAL DATA:  Productive cough EXAM: CHEST  2 VIEW COMPARISON:  10/11/2015 FINDINGS: Scarring in right upper lobe and right mid lung. Biapical pleural-parenchymal scarring. Additional bilateral lower lobe scarring/ atelectasis. Mild increased opacity in the right lower lobe at least raises the possibility of superimposed pneumonia. No pleural effusion or pneumothorax. The heart is normal in size. Visualized osseous structures are within normal limits. IMPRESSION: Scattered areas of scarring, particularly in the right upper lobe, chronic. Mild increased opacity in the right lower lobe at least raises the possibility of superimposed pneumonia.  Electronically Signed   By: Julian Hy M.D.   On: 11/30/2015 11:33   I have personally reviewed and evaluated these images as part of my medical decision-making.   EKG Interpretation None      MDM   Final diagnoses:  CAP (community acquired pneumonia)   Pt with cough, productive sputum. VS normal, afebrile, not hypoxic. Using inhaler. Mild wheezing but no respiratory distress. Will get CXR to ro pneumonia  11:59 AM CXR suspicious for pneumonia. Recent tx with z-pack. Will give levaquin for 5 days. Prednisone burst for 5 days. Tessalon for cough. Pt did ask for pain medications for his back, explained we do not refill chronic pain medications. Home with pcp follow up. Return precautions discussed.   Filed Vitals:   11/30/15 0912 11/30/15 0913 11/30/15 1145  BP: 134/75  125/74  Pulse: 87  78  Temp: 98.5 F (36.9 C)  98.9 F (37.2 C)  TempSrc: Oral  Oral  Resp: 18  20  Height:  5' 10.5" (1.791 m)   Weight:  83.944 kg  SpO2: 95%  97%   I personally performed the services described in this documentation, which was scribed in my presence. The recorded information has been reviewed and is accurate.   Jeannett Senior, PA-C 11/30/15 1201  Merrily Pew, MD 12/01/15 [phone]

## 2015-11-30 NOTE — ED Notes (Signed)
Declined W/C at D/C and was escorted to lobby by RN. 

## 2015-11-30 NOTE — Discharge Instructions (Signed)
Take levaquin as prescribed until all gone. Prednisone until all gone. Tessalon for cough as needed. Use your inhaler 2 puffs every 4 hrs. Follow up with your primary care doctor, return if worsening.    Community-Acquired Pneumonia, Adult Pneumonia is an infection of the lungs. There are different types of pneumonia. One type can develop while a person is in a hospital. A different type, called community-acquired pneumonia, develops in people who are not, or have not recently been, in the hospital or other health care facility.  CAUSES Pneumonia may be caused by bacteria, viruses, or funguses. Community-acquired pneumonia is often caused by Streptococcus pneumonia bacteria. These bacteria are often passed from one person to another by breathing in droplets from the cough or sneeze of an infected person. RISK FACTORS The condition is more likely to develop in:  People who havechronic diseases, such as chronic obstructive pulmonary disease (COPD), asthma, congestive heart failure, cystic fibrosis, diabetes, or kidney disease.  People who haveearly-stage or late-stage HIV.  People who havesickle cell disease.  People who havehad their spleen removed (splenectomy).  People who havepoor Human resources officer.  People who havemedical conditions that increase the risk of breathing in (aspirating) secretions their own mouth and nose.   People who havea weakened immune system (immunocompromised).  People who smoke.  People whotravel to areas where pneumonia-causing germs commonly exist.  People whoare around animal habitats or animals that have pneumonia-causing germs, including birds, bats, rabbits, cats, and farm animals. SYMPTOMS Symptoms of this condition include:  Adry cough.  A wet (productive) cough.  Fever.  Sweating.  Chest pain, especially when breathing deeply or coughing.  Rapid breathing or difficulty breathing.  Shortness of breath.  Shaking  chills.  Fatigue.  Muscle aches. DIAGNOSIS Your health care provider will take a medical history and perform a physical exam. You may also have other tests, including:  Imaging studies of your chest, including X-rays.  Tests to check your blood oxygen level and other blood gases.  Other tests on blood, mucus (sputum), fluid around your lungs (pleural fluid), and urine. If your pneumonia is severe, other tests may be done to identify the specific cause of your illness. TREATMENT The type of treatment that you receive depends on many factors, such as the cause of your pneumonia, the medicines you take, and other medical conditions that you have. For most adults, treatment and recovery from pneumonia may occur at home. In some cases, treatment must happen in a hospital. Treatment may include:  Antibiotic medicines, if the pneumonia was caused by bacteria.  Antiviral medicines, if the pneumonia was caused by a virus.  Medicines that are given by mouth or through an IV tube.  Oxygen.  Respiratory therapy. Although rare, treating severe pneumonia may include:  Mechanical ventilation. This is done if you are not breathing well on your own and you cannot maintain a safe blood oxygen level.  Thoracentesis. This procedureremoves fluid around one lung or both lungs to help you breathe better. HOME CARE INSTRUCTIONS  Take over-the-counter and prescription medicines only as told by your health care provider.  Only takecough medicine if you are losing sleep. Understand that cough medicine can prevent your body's natural ability to remove mucus from your lungs.  If you were prescribed an antibiotic medicine, take it as told by your health care provider. Do not stop taking the antibiotic even if you start to feel better.  Sleep in a semi-upright position at night. Try sleeping in a reclining chair, or  place a few pillows under your head.  Do not use tobacco products, including cigarettes,  chewing tobacco, and e-cigarettes. If you need help quitting, ask your health care provider.  Drink enough water to keep your urine clear or pale yellow. This will help to thin out mucus secretions in your lungs. PREVENTION There are ways that you can decrease your risk of developing community-acquired pneumonia. Consider getting a pneumococcal vaccine if:  You are older than 60 years of age.  You are older than 60 years of age and are undergoing cancer treatment, have chronic lung disease, or have other medical conditions that affect your immune system. Ask your health care provider if this applies to you. There are different types and schedules of pneumococcal vaccines. Ask your health care provider which vaccination option is best for you. You may also prevent community-acquired pneumonia if you take these actions:  Get an influenza vaccine every year. Ask your health care provider which type of influenza vaccine is best for you.  Go to the dentist on a regular basis.  Wash your hands often. Use hand sanitizer if soap and water are not available. SEEK MEDICAL CARE IF:  You have a fever.  You are losing sleep because you cannot control your cough with cough medicine. SEEK IMMEDIATE MEDICAL CARE IF:  You have worsening shortness of breath.  You have increased chest pain.  Your sickness becomes worse, especially if you are an older adult or have a weakened immune system.  You cough up blood.   This information is not intended to replace advice given to you by your health care provider. Make sure you discuss any questions you have with your health care provider.   Document Released: 09/07/2005 Document Revised: 05/29/2015 Document Reviewed: 01/02/2015 Elsevier Interactive Patient Education [employer].

## 2015-12-14 ENCOUNTER — Emergency Department (HOSPITAL_COMMUNITY)
Admission: EM | Admit: 2015-12-14 | Discharge: 2015-12-14 | Disposition: A | Discharge status: Home / Self Care | Payer: Medicaid Other | Attending: Emergency Medicine | Admitting: Emergency Medicine

## 2015-12-14 DIAGNOSIS — Z7982 Long term (current) use of aspirin: Secondary | ICD-10-CM | POA: Insufficient documentation

## 2015-12-14 DIAGNOSIS — Z87891 Personal history of nicotine dependence: Secondary | ICD-10-CM | POA: Diagnosis not present

## 2015-12-14 DIAGNOSIS — R040 Epistaxis: Secondary | ICD-10-CM | POA: Diagnosis not present

## 2015-12-14 DIAGNOSIS — Z9861 Coronary angioplasty status: Secondary | ICD-10-CM | POA: Diagnosis not present

## 2015-12-14 DIAGNOSIS — Z79899 Other long term (current) drug therapy: Secondary | ICD-10-CM | POA: Insufficient documentation

## 2015-12-14 DIAGNOSIS — Z7952 Long term (current) use of systemic steroids: Secondary | ICD-10-CM | POA: Diagnosis not present

## 2015-12-14 DIAGNOSIS — Z7902 Long term (current) use of antithrombotics/antiplatelets: Secondary | ICD-10-CM | POA: Insufficient documentation

## 2015-12-14 DIAGNOSIS — Z792 Long term (current) use of antibiotics: Secondary | ICD-10-CM | POA: Diagnosis not present

## 2015-12-14 DIAGNOSIS — I2510 Atherosclerotic heart disease of native coronary artery without angina pectoris: Secondary | ICD-10-CM | POA: Diagnosis not present

## 2015-12-14 NOTE — Discharge Instructions (Signed)
There does not appear to be an emergent cause for your symptoms at this time. You may try rubbing Vaseline on the inside of your nose to help keep the membranes moist. He may also try using a humidifier at night. Follow up with your doctor for reevaluation. Return to ED for any new or worsening symptoms as we discussed.  Nosebleed Nosebleeds are common. A nosebleed can be caused by many things, including:  Getting hit hard in the nose.  Infections.  Dryness in your nose.  A dry climate.  Medicines.  Picking your nose.  Your home heating and cooling systems. HOME CARE   Try controlling your nosebleed by pinching your nostrils gently. Do this for at least 10 minutes.  Avoid blowing or sniffing your nose for a number of hours after having a nosebleed.  Do not put gauze inside of your nose yourself. If your nose was packed by your doctor, try to keep the pack inside of your nose until your doctor removes it.  If a gauze pack was used and it starts to fall out, gently replace it or cut off the end of it.  If a balloon catheter was used to pack your nose, do not cut or remove it unless told by your doctor.  Avoid lying down while you are having a nosebleed. Sit up and lean forward.  Use a nasal spray decongestant to help with a nosebleed as told by your doctor.  Do not use petroleum jelly or mineral oil in your nose. These can drip into your lungs.  Keep your house humid by using:  Less air conditioning.  A humidifier.  Aspirin and blood thinners make bleeding more likely. If you are prescribed these medicines and you have nosebleeds, ask your doctor if you should stop taking the medicines or adjust the dose. Do not stop medicines unless told by your doctor.  Resume your normal activities as you are able. Avoid straining, lifting, or bending at your waist for several days.  If your nosebleed was caused by dryness in your nose, use over-the-counter saline nasal spray or gel. If  you must use a lubricant:  Choose one that is water-soluble.  Use it only as needed.  Do not use it within several hours of lying down.  Keep all follow-up visits as told by your doctor. This is important. GET HELP IF:  You have a fever.  You get frequent nosebleeds.  You are getting nosebleeds more often. GET HELP RIGHT AWAY IF:  Your nosebleed lasts longer than 20 minutes.  Your nosebleed occurs after an injury to your face, and your nose looks crooked or broken.  You have unusual bleeding from other parts of your body.  You have unusual bruising on other parts of your body.  You feel light-headed or dizzy.  You become sweaty.  You throw up (vomit) blood.  You have a nosebleed after a head injury.   This information is not intended to replace advice given to you by your health care provider. Make sure you discuss any questions you have with your health care provider.   Document Released: 06/16/2008 Document Revised: 09/28/2014 Document Reviewed: 04/23/2014 Elsevier Interactive Patient Education [employer].

## 2015-12-14 NOTE — ED Provider Notes (Signed)
CSN: [id number]     Arrival date & time 12/14/15  2044 History   First MD Initiated Contact with Patient 12/14/15 2134     Chief Complaint  Patient presents with  . Epistaxis     (Consider location/radiation/quality/duration/timing/severity/associated sxs/prior Treatment) HPI Donald Leblanc is a 60 y.o. male who comes in for evaluation of nosebleed. Patient reports he had a right nostril nosebleed that started earlier today but would not stop. He reports packing it with paper towels, no bleeding emergent department. He denies any anticoagulation other than a baby aspirin "sometimes". Denies any other headache, vision changes, chest pain, shortness of breath, numbness or weakness, dizziness. Denies any easy bruising, other bleeding. No allergies that he is aware of. No other modifying factors.  Past Medical History  Diagnosis Date  . Back pain   . Seizures ([place])   . Coronary artery disease    Past Surgical History  Procedure Laterality Date  . Coronary angioplasty with stent placement     No family history on file. Social History  Substance Use Topics  . Smoking status: Former Smoker    Types: Cigarettes  . Smokeless tobacco: Not on file  . Alcohol Use: No    Review of Systems A 10 point review of systems was completed and was negative except for pertinent positives and negatives as mentioned in the history of present illness     Allergies  Review of patient's allergies indicates no known allergies.  Home Medications   Prior to Admission medications   Medication Sig Start Date End Date Taking? Authorizing Provider  albuterol (PROVENTIL HFA;VENTOLIN HFA) 108 (90 Base) MCG/ACT inhaler Inhale 2 puffs into the lungs every 4 (four) hours as needed for wheezing or shortness of breath. 10/11/15   Kayla Rose, PA-C  amLODipine (NORVASC) 10 MG tablet Take 10 mg by mouth daily.    Historical Provider, MD  aspirin EC 81 MG tablet Take 81 mg by mouth daily.    Historical Provider, MD   atorvastatin (LIPITOR) 80 MG tablet Take 80 mg by mouth daily.    Historical Provider, MD  azithromycin (ZITHROMAX) 250 MG tablet Take 1 tablet (250 mg total) by mouth daily. Take first 2 tablets together, then 1 every day until finished. 10/11/15   Gloriann Loan, PA-C  benzonatate (TESSALON) 100 MG capsule Take 1 capsule (100 mg total) by mouth 3 (three) times daily as needed for cough. 10/11/15   Gloriann Loan, PA-C  benzonatate (TESSALON) 100 MG capsule Take 1 capsule (100 mg total) by mouth every 8 (eight) hours. 11/30/15   Tatyana Kirichenko, PA-C  clopidogrel (PLAVIX) 75 MG tablet Take 75 mg by mouth daily.    Historical Provider, MD  HYDROcodone-acetaminophen (NORCO) 10-325 MG per tablet Take 1 tablet by mouth every 6 (six) hours as needed for moderate pain.    Historical Provider, MD  isosorbide mononitrate (IMDUR) 60 MG 24 hr tablet Take 60 mg by mouth daily.    Historical Provider, MD  levofloxacin (LEVAQUIN) 750 MG tablet Take 1 tablet (750 mg total) by mouth daily. 11/30/15   Tatyana Kirichenko, PA-C  methocarbamol (ROBAXIN) 500 MG tablet Take 1 tablet (500 mg total) by mouth 2 (two) times daily. Patient not taking: Reported on 12/10/2014 09/16/14   Domenic Moras, PA-C  metoprolol (LOPRESSOR) 50 MG tablet Take 50 mg by mouth 2 (two) times daily.    Historical Provider, MD  oxyCODONE-acetaminophen (PERCOCET/ROXICET) 5-325 MG per tablet Take 1 tablet by mouth every 6 (six) hours as needed.  Post op pain    Historical Provider, MD  phenytoin (DILANTIN) 100 MG ER capsule Take 100 mg by mouth 3 (three) times daily.    Historical Provider, MD  potassium chloride SA (K-DUR,KLOR-CON) 20 MEQ tablet Take 20 mEq by mouth daily.    Historical Provider, MD  predniSONE (DELTASONE) 20 MG tablet Take 2 tablets (40 mg total) by mouth daily. 11/30/15   Tatyana Kirichenko, PA-C  SIMVASTATIN PO Take 1 tablet by mouth at bedtime.    Historical Provider, MD   BP 138/79 mmHg  Pulse 80  Temp(Src) 97.6 F (36.4 C) (Oral)   Resp 18  SpO2 96% Physical Exam  Constitutional: He is oriented to person, place, and time. He appears well-developed and well-nourished.  HENT:  Head: Normocephalic and atraumatic.  Mouth/Throat: Oropharynx is clear and moist.  Blood clot removed from right nares. No active bleeding. No identifiable lesions or obstructions. Mild posterior oropharyngeal blood streaks.  Eyes: Conjunctivae are normal. Pupils are equal, round, and reactive to light. Right eye exhibits no discharge. Left eye exhibits no discharge. No scleral icterus.  Neck: Neck supple.  Cardiovascular: Normal rate, regular rhythm and normal heart sounds.   Pulmonary/Chest: Effort normal and breath sounds normal. No respiratory distress. He has no wheezes. He has no rales.  Abdominal: Soft. There is no tenderness.  Musculoskeletal: He exhibits no tenderness.  Neurological: He is alert and oriented to person, place, and time.  Cranial Nerves II-XII grossly intact  Skin: Skin is warm and dry. No rash noted.  Psychiatric: He has a normal mood and affect.  Nursing note and vitals reviewed.   ED Course  Procedures (including critical care time) Labs Review Labs Reviewed - No data to display  Imaging Review No results found. I have personally reviewed and evaluated these images and lab results as part of my medical decision-making.   EKG Interpretation None     Meds given in ED:  Medications - No data to display  Discharge Medication List as of 12/14/2015  9:59 PM     Filed Vitals:   12/14/15 2116  BP: 138/79  Pulse: 80  Temp: 97.6 F (36.4 C)  TempSrc: Oral  Resp: 18  SpO2: 96%    MDM  Donald Leblanc is a 60 y.o. male with motion of past medical history comes in for evaluation of epistaxis. Patient has spontaneous nosebleed in right nares. No anticoagulation. No other increased bleeding or bruising. No bleeding in emergency department. No concerning symptoms for anemia or other hematologic abnormality.  Encouraged Vaseline and mucous membranes, humidifier at home. OTC allergy medication. Return precautions discussed. Patient and wife the bedside verbalize understanding and agreement with this plan as well as subsequent discharge. Final diagnoses:  Epistaxis        Comer Locket, PA-C 12/14/15 2215  Carmin Muskrat, MD 12/15/15 2140

## 2015-12-14 NOTE — ED Notes (Signed)
Pt states his R nostril started bleeding earlier today and wouldn't stop. Bleeding is controlled at this time, pt has tissue placed in R nostril to control bleeding. Pt denies taking any blood thinner medication. Pt AAOX4, in NAD. Denies pain.

## 2017-03-22 ENCOUNTER — Ambulatory Visit (INDEPENDENT_AMBULATORY_CARE_PROVIDER_SITE_OTHER): Payer: Medicaid Other

## 2017-03-22 ENCOUNTER — Ambulatory Visit (HOSPITAL_COMMUNITY)
Admission: EM | Admit: 2017-03-22 | Discharge: 2017-03-22 | Disposition: A | Discharge status: Home / Self Care | Payer: Medicaid Other | Attending: Family Medicine | Admitting: Family Medicine

## 2017-03-22 ENCOUNTER — Encounter (HOSPITAL_COMMUNITY): Payer: Self-pay | Admitting: Emergency Medicine

## 2017-03-22 DIAGNOSIS — S82401A Unspecified fracture of shaft of right fibula, initial encounter for closed fracture: Secondary | ICD-10-CM | POA: Diagnosis not present

## 2017-03-22 MED ORDER — IBUPROFEN 800 MG PO TABS: 800.0000 mg | ORAL_TABLET | Freq: Three times a day (TID) | ORAL | 0 refills | Status: DC

## 2017-03-22 NOTE — ED Triage Notes (Addendum)
Fall occurred approx one hour ago.  [place] off a ladder.  Patient landed on dirt.  Patient landed on right foot and rolled right  ankle.  Patient's foot is warm to touch, pulses present

## 2017-03-22 NOTE — ED Provider Notes (Signed)
CSN: [id number]     Arrival date & time 03/22/17  1655 History   None    Chief Complaint  Patient presents with  . Fall   (Consider location/radiation/quality/duration/timing/severity/associated sxs/prior Treatment) Patient c/o right ankle pain after jumping down and then injuring right ankle.  He has swelling and pain in right lateral ankle.   The history is provided by the patient.  Fall  This is a new problem. The problem occurs constantly. The problem has not changed since onset.   Past Medical History:  Diagnosis Date  . Back pain   . Coronary artery disease   . Seizures ([place])    Past Surgical History:  Procedure Laterality Date  . CORONARY ANGIOPLASTY WITH STENT PLACEMENT     No family history on file. Social History  Substance Use Topics  . Smoking status: Former Smoker    Types: Cigarettes  . Smokeless tobacco: Not on file  . Alcohol use No    Review of Systems  Constitutional: Negative.   HENT: Negative.   Eyes: Negative.   Respiratory: Negative.   Cardiovascular: Negative.   Gastrointestinal: Negative.   Endocrine: Negative.   Genitourinary: Negative.   Musculoskeletal: Positive for arthralgias.  Allergic/Immunologic: Negative.   Neurological: Negative.   Hematological: Negative.   Psychiatric/Behavioral: Negative.     Allergies  Patient has no known allergies.  Home Medications   Prior to Admission medications   Medication Sig Start Date End Date Taking? Authorizing Provider  phenytoin (DILANTIN) 100 MG ER capsule Take 100 mg by mouth 3 (three) times daily.   Yes [provider]  albuterol (PROVENTIL HFA;VENTOLIN HFA) 108 (90 Base) MCG/ACT inhaler Inhale 2 puffs into the lungs every 4 (four) hours as needed for wheezing or shortness of breath. 10/11/15   Gloriann Loan, PA-C  amLODipine (NORVASC) 10 MG tablet Take 10 mg by mouth daily.    [provider]  aspirin EC 81 MG tablet Take 81 mg by mouth daily.    [provider]   atorvastatin (LIPITOR) 80 MG tablet Take 80 mg by mouth daily.    [provider]  azithromycin (ZITHROMAX) 250 MG tablet Take 1 tablet (250 mg total) by mouth daily. Take first 2 tablets together, then 1 every day until finished. 10/11/15   Gloriann Loan, PA-C  benzonatate (TESSALON) 100 MG capsule Take 1 capsule (100 mg total) by mouth 3 (three) times daily as needed for cough. 10/11/15   Gloriann Loan, PA-C  benzonatate (TESSALON) 100 MG capsule Take 1 capsule (100 mg total) by mouth every 8 (eight) hours. 11/30/15   Kirichenko, [place] Rocker, PA-C  clopidogrel (PLAVIX) 75 MG tablet Take 75 mg by mouth daily.    [provider]  HYDROcodone-acetaminophen (NORCO) 10-325 MG per tablet Take 1 tablet by mouth every 6 (six) hours as needed for moderate pain.    [provider]  ibuprofen (ADVIL,MOTRIN) 800 MG tablet Take 1 tablet (800 mg total) by mouth 3 (three) times daily. 03/22/17   Lysbeth Penner, FNP  isosorbide mononitrate (IMDUR) 60 MG 24 hr tablet Take 60 mg by mouth daily.    [provider]  levofloxacin (LEVAQUIN) 750 MG tablet Take 1 tablet (750 mg total) by mouth daily. 11/30/15   Kirichenko, Tatyana, PA-C  methocarbamol (ROBAXIN) 500 MG tablet Take 1 tablet (500 mg total) by mouth 2 (two) times daily. Patient not taking: Reported on 12/10/2014 09/16/14   Domenic Moras, PA-C  metoprolol (LOPRESSOR) 50 MG tablet Take 50 mg by  mouth 2 (two) times daily.    [provider]  oxyCODONE-acetaminophen (PERCOCET/ROXICET) 5-325 MG per tablet Take 1 tablet by mouth every 6 (six) hours as needed. Post op pain    [provider]  potassium chloride SA (K-DUR,KLOR-CON) 20 MEQ tablet Take 20 mEq by mouth daily.    [provider]  predniSONE (DELTASONE) 20 MG tablet Take 2 tablets (40 mg total) by mouth daily. 11/30/15   Kirichenko, Tatyana, PA-C  SIMVASTATIN PO Take 1 tablet by mouth at bedtime.    [provider]   Meds Ordered and  Administered this Visit  Medications - No data to display  BP (!) 152/79 (BP Location: Left Arm)   Pulse 85   Temp 97.7 F (36.5 C) (Oral)   Resp 20   SpO2 99%  No data found.   Physical Exam  Constitutional: He is oriented to person, place, and time. He appears well-developed and well-nourished.  HENT:  Head: Normocephalic and atraumatic.  Eyes: Conjunctivae and EOM are normal. Pupils are equal, round, and reactive to light.  Neck: Normal range of motion. Neck supple.  Cardiovascular: Normal rate, regular rhythm and normal heart sounds.   Pulmonary/Chest: Effort normal and breath sounds normal.  Abdominal: Soft. Bowel sounds are normal.  Musculoskeletal: He exhibits tenderness.  Right lateral malleolus with swelling and tenderness.  Neurological: He is alert and oriented to person, place, and time.  Nursing note and vitals reviewed.   Urgent Care Course     Procedures (including critical care time)  Labs Review Labs Reviewed - No data to display  Imaging Review Dg Ankle Complete Right  Result Date: 03/22/2017 CLINICAL DATA:  Status post fall 1 hour ago with pain lateral ankle. EXAM: RIGHT ANKLE - COMPLETE 3+ VIEW COMPARISON:  None. FINDINGS: There is fracture of distal tip of distal fibula. Surrounding soft tissue swelling is identified. There is no dislocation. IMPRESSION: Fracture of the distal tip of distal fibula with surrounding soft tissue swelling. Electronically Signed   By: Abelardo Diesel M.D.   On: 03/22/2017 17:39     Visual Acuity Review  Right Eye Distance:   Left Eye Distance:   Bilateral Distance:    Right Eye Near:   Left Eye Near:    Bilateral Near:         MDM   1. Closed fracture of shaft of right fibula, unspecified fracture morphology, initial encounter    Cam Walker Crutches Motrin 800mg  one po tid #21 Continue taking Hydrocodone from home  Call Dr. Ninfa Linden office tomorrow for an appointment in 1 week.      Lysbeth Penner,  [place] 03/22/17 1812

## 2017-03-23 ENCOUNTER — Ambulatory Visit (INDEPENDENT_AMBULATORY_CARE_PROVIDER_SITE_OTHER): Payer: Medicaid Other | Admitting: Physician Assistant

## 2017-03-23 DIAGNOSIS — S82831A Other fracture of upper and lower end of right fibula, initial encounter for closed fracture: Secondary | ICD-10-CM

## 2017-03-23 DIAGNOSIS — M79671 Pain in right foot: Secondary | ICD-10-CM | POA: Diagnosis not present

## 2017-03-23 DIAGNOSIS — S82839A Other fracture of upper and lower end of unspecified fibula, initial encounter for closed fracture: Secondary | ICD-10-CM

## 2017-03-23 NOTE — Progress Notes (Signed)
Office Visit Note   Patient: Donald Leblanc           Date of Birth: [birth year]           MRN: [id number] Visit Date: 03/23/2017              Requested by: [facility] [address] [place],  [address] PCP: [facility], [facility]: Visit Diagnoses:  1. Avulsion fracture of distal fibula     Plan: He is weightbearing as tolerated in the [employer]. Discussed with him the need to elevate the ankle above his heart with was toes often. He does not need to wear the boot to bed. Recommended ice. Start he on pain medicine due to his low back pain. He does have an avulsion off the distal tip of the fibula is more of a sprain tdo the  small fragment size. Does not require surgery.  Follow-Up Instructions: Return in about 3 weeks (around 04/13/2017).   Orders:  No orders of the defined types were placed in this encounter.  No orders of the defined types were placed in this encounter.     Procedures: No procedures performed   Clinical Data: No additional findings.   Subjective: Ankle pain  HPI  Mr. Kaltenbach is a 61 year old male who unfortunately had a fall off a ladder yesterday while cutting limbs. He injured his right ankle. He denies any loss consciousness dizziness chest pain shortness breath. Went to the ER yesterday was found to have a distal fibula avulsion-type fracture was placed in a Cam Walker boot. Seen here today for follow-up. She is in crutches to ambulate.  Review of Systems See history of present illness otherwise negative.  Objective: Vital Signs: There were no vitals taken for this visit.  Physical Exam  Constitutional: He is oriented to person, place, and time. He appears well-developed and well-nourished. No distress.  Cardiovascular: Intact distal pulses.   Pulmonary/Chest: Effort normal.  Neurological: He is alert and oriented to person, place, and time.  Skin: He is not diaphoretic.    Psychiatric: He has a normal mood and affect. His behavior is normal.    Ortho Exam  Specialty Comments:  No specialty comments available.  Imaging: Dg Ankle Complete Right  Result Date: 03/22/2017 CLINICAL DATA:  Status post fall 1 hour ago with pain lateral ankle. EXAM: RIGHT ANKLE - COMPLETE 3+ VIEW COMPARISON:  None. FINDINGS: There is fracture of distal tip of distal fibula. Surrounding soft tissue swelling is identified. There is no dislocation. IMPRESSION: Fracture of the distal tip of distal fibula with surrounding soft tissue swelling. Electronically Signed   By: Abelardo Diesel M.D.   On: 03/22/2017 17:39  Personally reviewed the right ankle films which showed just a small distal tip of the fibula avulsion-type fracture. Otherwise talus well located within the ankle mortise. No diastases no acute fractures otherwise.   PMFS History: There are no active problems to display for this patient.  Past Medical History:  Diagnosis Date  . Back pain   . Coronary artery disease   . Seizures ([place])     No family history on file.  Past Surgical History:  Procedure Laterality Date  . CORONARY ANGIOPLASTY WITH STENT PLACEMENT     Social History   Occupational History  . Not on file.   Social History Main Topics  . Smoking status: Former Smoker    Types: Cigarettes  . Smokeless tobacco: Not on file  .  Alcohol use No  . Drug use: No  . Sexual activity: Not on file    +

## 2017-04-14 ENCOUNTER — Ambulatory Visit (INDEPENDENT_AMBULATORY_CARE_PROVIDER_SITE_OTHER): Payer: Medicaid Other | Admitting: Orthopaedic Surgery

## 2017-05-04 ENCOUNTER — Ambulatory Visit (INDEPENDENT_AMBULATORY_CARE_PROVIDER_SITE_OTHER): Payer: Medicaid Other

## 2017-05-04 ENCOUNTER — Ambulatory Visit (INDEPENDENT_AMBULATORY_CARE_PROVIDER_SITE_OTHER): Payer: Medicaid Other | Admitting: Orthopaedic Surgery

## 2017-05-04 DIAGNOSIS — M25571 Pain in right ankle and joints of right foot: Secondary | ICD-10-CM | POA: Diagnosis not present

## 2017-05-04 NOTE — Progress Notes (Signed)
                             The patient is now 6-7 weeks into an ankle injury of his right ankle. He sustained a minimal fracture to the tip of the fibula. He still and it cam walking boot and using crutches and having severe pain however he is on chronic hydrocodone due to chronic back issues.  On examination his ankles ankles well located. The swelling is minimal. He has limited motion but minimal pain and no instability on exam.  3 views of the ankle obtained and show well located ankle mortise with a tiny avulsion at the tip of the fibula. Otherwise the ankle is unremarkable.  At this point we'll transition him to an ASO. He can continue weight-bear as tolerated. We can see him back for final visit in 4 weeks for repeat exam but does not need x-rays at that point. If he is not improving his ankle function we can send him to outpatient physical therapy up to go.

## 2017-06-07 ENCOUNTER — Ambulatory Visit (INDEPENDENT_AMBULATORY_CARE_PROVIDER_SITE_OTHER): Payer: Medicaid Other | Admitting: Orthopaedic Surgery

## 2017-07-27 ENCOUNTER — Ambulatory Visit (INDEPENDENT_AMBULATORY_CARE_PROVIDER_SITE_OTHER): Payer: Medicaid Other | Admitting: Orthopaedic Surgery

## 2017-07-27 ENCOUNTER — Encounter (INDEPENDENT_AMBULATORY_CARE_PROVIDER_SITE_OTHER): Payer: Self-pay | Admitting: Orthopaedic Surgery

## 2017-07-27 DIAGNOSIS — M25571 Pain in right ankle and joints of right foot: Secondary | ICD-10-CM

## 2017-07-27 NOTE — Progress Notes (Signed)
The patient continues follow-up for severe ankle sprain of his right ankle.  This occurred in  July of this year.  He had a small avulsion of the tip of the fibula.  But he has severe sprain as well.  He is 61 years old and is concerned that he has not gotten over this yet.  He does not smoke and is not a diabetic.  On exam he does have a stiff ankle in general but is not ligamentously unstable at all.  I talked to her about the possibility of an injection in his ankle or physical therapy for both knees.  Given reassurance he should get better with time but if he has any issues and would like to try therapy or an injection is noted.  All other is follow-up as needed.

## 2017-08-23 ENCOUNTER — Telehealth: Payer: Self-pay | Admitting: Internal Medicine

## 2017-09-03 ENCOUNTER — Encounter: Payer: Self-pay | Admitting: Internal Medicine

## 2017-09-03 NOTE — Telephone Encounter (Signed)
Dr. Henrene Pastor reviewed records and has accepted patient. Ok to schedule office visit. Appointment scheduled

## 2017-10-27 ENCOUNTER — Encounter (INDEPENDENT_AMBULATORY_CARE_PROVIDER_SITE_OTHER): Payer: Self-pay

## 2017-10-27 ENCOUNTER — Encounter: Payer: Self-pay | Admitting: Internal Medicine

## 2017-10-27 ENCOUNTER — Ambulatory Visit (INDEPENDENT_AMBULATORY_CARE_PROVIDER_SITE_OTHER): Payer: Non-veteran care | Admitting: Internal Medicine

## 2017-10-27 VITALS — BP 122/78 | HR 74 | Ht 68.5 in | Wt 188.2 lb

## 2017-10-27 DIAGNOSIS — Z86010 Personal history of colonic polyps: Secondary | ICD-10-CM

## 2017-10-27 DIAGNOSIS — Z7902 Long term (current) use of antithrombotics/antiplatelets: Secondary | ICD-10-CM

## 2017-10-27 NOTE — Patient Instructions (Signed)

## 2017-10-28 ENCOUNTER — Encounter: Payer: Self-pay | Admitting: Internal Medicine

## 2017-10-28 NOTE — Progress Notes (Signed)
HISTORY OF PRESENT ILLNESS:  Donald Leblanc is a 62 y.o. male with multiple medical problems including COPD, coronary artery disease status post coronary artery stent placement on aspirin and Plavix, chronic pain syndrome on chronic narcotics, and seizure disorder. He is referred to this clinic by Dr. Sherral Leblanc at the [facility] in [place] regarding colonoscopy. Multiple outside records have been reviewed. Patient underwent colonoscopy in 2012 and was found to have adenomatous colon polyp. Follow-up in 5 years recommended. Repeat colonoscopy was performed November 2017. The examination was said to be compromised by poor prep and follow-up in 1 year recommended. He is sent for that purpose. The patient's GI review of systems is negative. No GI bleeding. No change in bowel habits or abdominal pain. His weight is stable. He denies a family history of colon cancer. He is accompanied by his wife. They were not certain why they were here today until I informed them.  REVIEW OF SYSTEMS:  All non-GI ROS negative except for  Past Medical History:  Diagnosis Date  . Back pain   . Colon polyps   . COPD (chronic obstructive pulmonary disease) ([place])   . Coronary artery disease   . Hx of substance abuse   . PVD (peripheral vascular disease) ([place])   . Seizures ([place])   . Thrombocytosis ([place])     Past Surgical History:  Procedure Laterality Date  . CORONARY ANGIOPLASTY WITH STENT PLACEMENT    . HAND SURGERY      Social History Donald Leblanc  reports that he has quit smoking. His smoking use included cigarettes. he has never used smokeless tobacco. He reports that he does not drink alcohol or use drugs.  family history includes Cancer in his mother.  No Known Allergies     PHYSICAL EXAMINATION: Vital signs: BP 122/78   Pulse 74   Ht 5' 8.5" (1.74 m)   Wt 188 lb 4 oz (85.4 kg)   BMI 28.21 kg/m   Constitutional: generally well-appearing, no acute distress Psychiatric: alert and oriented  x3, cooperative Eyes: extraocular movements intact, anicteric, conjunctiva pink Mouth: oral pharynx moist, no lesions Neck: supple no lymphadenopathy Cardiovascular: heart regular rate and rhythm, no murmur Lungs: clear to auscultation bilaterally Abdomen: soft, nontender, nondistended, no obvious ascites, no peritoneal signs, normal bowel sounds, no organomegaly Rectal: Deferred until colonoscopy Extremities: no lower extremity edema bilaterally Skin: no clubbing, cyanosis, or lesions on visible extremities Neuro: No focal deficits. Cranial nerves intact  ASSESSMENT:  #1. History of adenomatous colon polyp 2012. Follow-up examination 2017 compromised by poor preparation #2. Multiple medical problems including coronary artery disease with prior coronary artery stent and chronic Plavix/aspirin therapy and chronic pain syndrome requiring chronic narcotics   PLAN:  #1. Surveillance colonoscopy. The patient is high-risk given his comorbidities and his need for aspirin and Plavix therapy. We will perform the examination ON Plavix and aspirin with potential benefits, risks and limitations discussed today.The nature of the procedure, as well as the risks, benefits, and alternatives were carefully and thoroughly reviewed with the patient. Ample time for discussion and questions allowed. The patient understood, was satisfied, and agreed to proceed.  A copy of this consultation note has been sent to Dr. Windy Leblanc at the [facility]

## 2017-11-08 ENCOUNTER — Encounter: Payer: Self-pay | Admitting: Internal Medicine

## 2017-11-08 ENCOUNTER — Ambulatory Visit (AMBULATORY_SURGERY_CENTER): Payer: No Typology Code available for payment source | Admitting: Internal Medicine

## 2017-11-08 VITALS — BP 109/70 | HR 68 | Temp 96.6°F | Resp 19 | Ht 68.0 in | Wt 188.0 lb

## 2017-11-08 DIAGNOSIS — Z86010 Personal history of colonic polyps: Secondary | ICD-10-CM

## 2017-11-08 DIAGNOSIS — D122 Benign neoplasm of ascending colon: Secondary | ICD-10-CM

## 2017-11-08 MED ORDER — SODIUM CHLORIDE 0.9 % IV SOLN: 500.0000 mL | Freq: Once | INTRAVENOUS | Status: DC

## 2017-11-08 NOTE — Progress Notes (Signed)
Report to PACU, RN, vss, BBS= Clear.  

## 2017-11-08 NOTE — Patient Instructions (Addendum)
HANDOUTS : POLYPS AND HEMORRHOIDS.  RESTART PLAVIX TODAY.    YOU HAD AN ENDOSCOPIC PROCEDURE TODAY AT [facility]:   Refer to the procedure report that was given to you for any specific questions about what was found during the examination.  If the procedure report does not answer your questions, please call your gastroenterologist to clarify.  If you requested that your care partner not be given the details of your procedure findings, then the procedure report has been included in a sealed envelope for you to review at your convenience later.  YOU SHOULD EXPECT: Some feelings of bloating in the abdomen. Passage of more gas than usual.  Walking can help get rid of the air that was put into your GI tract during the procedure and reduce the bloating. If you had a lower endoscopy (such as a colonoscopy or flexible sigmoidoscopy) you may notice spotting of blood in your stool or on the toilet paper. If you underwent a bowel prep for your procedure, you may not have a normal bowel movement for a few days.  Please Note:  You might notice some irritation and congestion in your nose or some drainage.  This is from the oxygen used during your procedure.  There is no need for concern and it should clear up in a day or so.  SYMPTOMS TO REPORT IMMEDIATELY:   Following lower endoscopy (colonoscopy or flexible sigmoidoscopy):  Excessive amounts of blood in the stool  Significant tenderness or worsening of abdominal pains  Swelling of the abdomen that is new, acute  Fever of 100F or higher   For urgent or emergent issues, a gastroenterologist can be reached at any hour by calling [phone].   DIET:  We do recommend a small meal at first, but then you may proceed to your regular diet.  Drink plenty of fluids but you should avoid alcoholic beverages for 24 hours.  ACTIVITY:  You should plan to take it easy for the rest of today and you should NOT DRIVE or use heavy machinery until  tomorrow (because of the sedation medicines used during the test).    FOLLOW UP: Our staff will call the number listed on your records the next business day following your procedure to check on you and address any questions or concerns that you may have regarding the information given to you following your procedure. If we do not reach you, we will leave a message.  However, if you are feeling well and you are not experiencing any problems, there is no need to return our call.  We will assume that you have returned to your regular daily activities without incident.  If any biopsies were taken you will be contacted by phone or by letter within the next 1-3 weeks.  Please call us at [phone] if you have not heard about the biopsies in 3 weeks.    SIGNATURES/CONFIDENTIALITY: You and/or your care partner have signed paperwork which will be entered into your electronic medical record.  These signatures attest to the fact that that the information above on your After Visit Summary has been reviewed and is understood.  Full responsibility of the confidentiality of this discharge information lies with you and/or your care-partner.

## 2017-11-08 NOTE — Progress Notes (Signed)
Pt's states no medical or surgical changes since previsit or office visit. 

## 2017-11-08 NOTE — Op Note (Signed)
[facility] Patient Name: Donald Leblanc Procedure Date: 11/08/2017 11:10 AM MRN: [id number] Endoscopist: Docia Chuck. Henrene Pastor , MD Age: 62 Referring MD:  Date of Birth: Jan 15, 1956 Gender: Male Account #: [id number] Procedure:                Colonoscopy, with cold snare polypectomy x 1 Indications:              High risk colon cancer surveillance: Personal                            history of non-advanced adenoma. Prior examinations                            at the [facility]. Tubular adenoma in 2012.                            Follow-up in 2017 with poor prep. Thus, following                            up at this time. Medicines:                Monitored Anesthesia Care Procedure:                Pre-Anesthesia Assessment:                           - Prior to the procedure, a History and Physical                            was performed, and patient medications and                            allergies were reviewed. The patient's tolerance of                            previous anesthesia was also reviewed. The risks                            and benefits of the procedure and the sedation                            options and risks were discussed with the patient.                            All questions were answered, and informed consent                            was obtained. Prior Anticoagulants: The patient has                            taken Plavix (clopidogrel), last dose was day of                            procedure. ASA Grade Assessment: III - A patient  with severe systemic disease. After reviewing the                            risks and benefits, the patient was deemed in                            satisfactory condition to undergo the procedure.                           After obtaining informed consent, the colonoscope                            was passed under direct vision. Throughout the                            procedure,  the patient's blood pressure, pulse, and                            oxygen saturations were monitored continuously. The                            Model CF-HQ190L [id number]) scope was introduced                            through the anus and advanced to the the cecum,                            identified by appendiceal orifice and ileocecal                            valve. The ileocecal valve, appendiceal orifice,                            and rectum were photographed. The quality of the                            bowel preparation was excellent. The colonoscopy                            was performed without difficulty. The patient                            tolerated the procedure well. The bowel preparation                            used was SUPREP. Scope In: 11:16:16 AM Scope Out: 11:25:38 AM Scope Withdrawal Time: 0 hours 7 minutes 55 seconds  Total Procedure Duration: 0 hours 9 minutes 22 seconds  Findings:                 A 1 mm polyp was found in the proximal ascending                            colon. The polyp was removed with a cold snare.  Resection and retrieval were complete.                           Internal hemorrhoids were found during                            retroflexion. The hemorrhoids were small.                           The exam was otherwise without abnormality on                            direct and retroflexion views. Complications:            No immediate complications. Estimated blood loss:                            None. Estimated Blood Loss:     Estimated blood loss: none. Impression:               - One 1 mm polyp in the proximal ascending colon,                            removed with a cold snare. Resected and retrieved.                           - Internal hemorrhoids.                           - The examination was otherwise normal on direct                            and retroflexion views. Recommendation:            - Repeat colonoscopy in 5-10 years for surveillance.                           - Patient has a contact number available for                            emergencies. The signs and symptoms of potential                            delayed complications were discussed with the                            patient. Return to normal activities tomorrow.                            Written discharge instructions were provided to the                            patient.                           - Resume previous diet.                           -  Continue present medications.                           - Await pathology results.                           - Return to care Dr. Sherral Hammers at the [facility]. Henrene Pastor, MD 11/08/2017 11:31:02 AM This report has been signed electronically.

## 2017-11-08 NOTE — Progress Notes (Signed)
Called to room to assist during endoscopic procedure.  Patient ID and intended procedure confirmed with present staff. Received instructions for my participation in the procedure from the performing physician.  

## 2017-11-09 ENCOUNTER — Telehealth: Payer: Self-pay | Admitting: *Deleted

## 2017-11-09 ENCOUNTER — Telehealth: Payer: Self-pay

## 2017-11-09 NOTE — Telephone Encounter (Signed)
  Follow up Call-  Call back number 11/08/2017  Post procedure Call Back phone  # [phone]  Permission to leave phone message Yes  Some recent data might be hidden     Left message

## 2017-11-09 NOTE — Telephone Encounter (Signed)
  Follow up Call-  Call back number 11/08/2017  Post procedure Call Back phone  # [phone]  Permission to leave phone message Yes  Some recent data might be hidden     Patient questions:  Do you have a fever, pain , or abdominal swelling? No. Pain Score  0 *  Have you tolerated food without any problems? Yes.    Have you been able to return to your normal activities? Yes.    Do you have any questions about your discharge instructions: Diet   No. Medications  No. Follow up visit  No.  Do you have questions or concerns about your Care? No.  Actions: * If pain score is 4 or above: No action needed, pain <4.

## 2017-11-12 ENCOUNTER — Encounter: Payer: Self-pay | Admitting: Internal Medicine

## 2017-11-29 ENCOUNTER — Ambulatory Visit (INDEPENDENT_AMBULATORY_CARE_PROVIDER_SITE_OTHER): Payer: Medicaid Other | Admitting: Physician Assistant

## 2017-12-07 ENCOUNTER — Ambulatory Visit (INDEPENDENT_AMBULATORY_CARE_PROVIDER_SITE_OTHER): Payer: Medicaid Other | Admitting: Physician Assistant

## 2017-12-07 ENCOUNTER — Encounter (INDEPENDENT_AMBULATORY_CARE_PROVIDER_SITE_OTHER): Payer: Self-pay | Admitting: Physician Assistant

## 2017-12-07 DIAGNOSIS — M25571 Pain in right ankle and joints of right foot: Secondary | ICD-10-CM | POA: Diagnosis not present

## 2017-12-07 MED ORDER — LIDOCAINE HCL 1 % IJ SOLN
2.0000 mL | INTRAMUSCULAR | Status: AC | PRN
  Administered 2017-12-07: 2 mL

## 2017-12-07 MED ORDER — METHYLPREDNISOLONE ACETATE 40 MG/ML IJ SUSP
40.0000 mg | INTRAMUSCULAR | Status: AC | PRN
  Administered 2017-12-07: 40 mg via INTRA_ARTICULAR

## 2017-12-07 NOTE — Progress Notes (Signed)
   Procedure Note HPI: Donald Leblanc returns today follow-up of his right ankle.  Again he injured his ankle in July 2018 when he fell off a ladder.  He had an avulsion off the lateral malleolus.  He was treated conservatively with a Cam walker boot and then progressed to an ASO.  Despite time and conservative measures he continues to have 6 out of 10 pain at worst.  Most of his pain however is the medial aspect of the ankle.  He notes decreased range of motion the ankle.  Pain is worse whenever he is standing.  He denies any mechanical symptoms. Review of systems: Please see HPI otherwise negative  Physical exam: General well-developed well-nourished male no acute distress mood affect appropriate Bilateral ankles: Has slight global swelling of the right ankle compared to left.  There is no impending ulcers, ecchymosis erythema or rashes.  He has diminished dorsiflexion plantarflexion of the right ankle compared to left.  Decreased range of motion through the Chopart's joint of the right ankle.  Tender over the right anterior medial ankle and over the distal portion of the medial malleolus.  No tenderness over the lateral malleolus.  No tenderness over the peroneal tendons posterior tibial tendons bilaterally.  Achilles tendons are nontender bilaterally.  Calf supple nontender bilaterally  Patient: Donald Leblanc             Date of Birth: 03/14/1956           MRN: [id number]             Visit Date: 12/07/2017  Procedures: Visit Diagnoses: Pain in right ankle and joints of right foot  Medium Joint Inj on 12/07/2017 10:45 AM Medications: 2 mL lidocaine 1 %; 40 mg methylPREDNISolone acetate 40 MG/ML Consent was given by the patient. Patient was prepped and draped in the usual sterile fashion.     Plan: We will see how he does with the injection.  We will also send him to physical therapy to work on range of motion strengthening ankle wean him out of the ASO his proprioception and strength improved.   Follow-up in 1 month check his progress lack of.  If he continues to have pain may consider MRI to rule out osteochondral lesion of the ankle however patient does state he does not want any type of surgery.

## 2017-12-08 ENCOUNTER — Other Ambulatory Visit (INDEPENDENT_AMBULATORY_CARE_PROVIDER_SITE_OTHER): Payer: Self-pay

## 2017-12-08 DIAGNOSIS — G8929 Other chronic pain: Secondary | ICD-10-CM

## 2017-12-08 DIAGNOSIS — M25571 Pain in right ankle and joints of right foot: Principal | ICD-10-CM

## 2017-12-15 ENCOUNTER — Encounter (HOSPITAL_COMMUNITY): Payer: Self-pay

## 2017-12-15 ENCOUNTER — Emergency Department (HOSPITAL_COMMUNITY)
Admission: EM | Admit: 2017-12-15 | Discharge: 2017-12-15 | Disposition: A | Discharge status: Home / Self Care | Payer: Medicaid Other | Attending: Emergency Medicine | Admitting: Emergency Medicine

## 2017-12-15 ENCOUNTER — Other Ambulatory Visit: Payer: Self-pay

## 2017-12-15 ENCOUNTER — Emergency Department (HOSPITAL_COMMUNITY): Payer: Medicaid Other

## 2017-12-15 DIAGNOSIS — R05 Cough: Secondary | ICD-10-CM | POA: Diagnosis present

## 2017-12-15 DIAGNOSIS — Z79899 Other long term (current) drug therapy: Secondary | ICD-10-CM | POA: Insufficient documentation

## 2017-12-15 DIAGNOSIS — Z7982 Long term (current) use of aspirin: Secondary | ICD-10-CM | POA: Diagnosis not present

## 2017-12-15 DIAGNOSIS — Z7902 Long term (current) use of antithrombotics/antiplatelets: Secondary | ICD-10-CM | POA: Diagnosis not present

## 2017-12-15 DIAGNOSIS — Z87891 Personal history of nicotine dependence: Secondary | ICD-10-CM | POA: Diagnosis not present

## 2017-12-15 DIAGNOSIS — I2510 Atherosclerotic heart disease of native coronary artery without angina pectoris: Secondary | ICD-10-CM | POA: Diagnosis not present

## 2017-12-15 DIAGNOSIS — Z955 Presence of coronary angioplasty implant and graft: Secondary | ICD-10-CM | POA: Diagnosis not present

## 2017-12-15 DIAGNOSIS — J441 Chronic obstructive pulmonary disease with (acute) exacerbation: Secondary | ICD-10-CM | POA: Insufficient documentation

## 2017-12-15 MED ORDER — PREDNISONE 50 MG PO TABS: ORAL_TABLET | ORAL | 0 refills | Status: DC

## 2017-12-15 MED ORDER — ALBUTEROL SULFATE HFA 108 (90 BASE) MCG/ACT IN AERS: 1.0000 | INHALATION_SPRAY | Freq: Four times a day (QID) | RESPIRATORY_TRACT | 0 refills | Status: DC | PRN

## 2017-12-15 MED ORDER — ALBUTEROL SULFATE HFA 108 (90 BASE) MCG/ACT IN AERS
2.0000 | INHALATION_SPRAY | Freq: Once | RESPIRATORY_TRACT | Status: AC
  Administered 2017-12-15: 2 via RESPIRATORY_TRACT
  Filled 2017-12-15: qty 6.7

## 2017-12-15 MED ORDER — HYDROCODONE-ACETAMINOPHEN 5-325 MG PO TABS: ORAL_TABLET | ORAL | 0 refills | Status: DC

## 2017-12-15 MED ORDER — PREDNISONE 20 MG PO TABS
60.0000 mg | ORAL_TABLET | Freq: Once | ORAL | Status: AC
  Administered 2017-12-15: 60 mg via ORAL
  Filled 2017-12-15: qty 3

## 2017-12-15 NOTE — ED Triage Notes (Signed)
Patient complains of cough x 2 days, worse at night, no relief with inhaler

## 2017-12-15 NOTE — Discharge Instructions (Addendum)
Take vicodin for breakthrough pain, do not drink alcohol, drive, care for children or do other critical tasks while taking vicodin. ° °Please follow with your primary care doctor in the next 2 days for a check-up. They must obtain records for further management.  ° °Do not hesitate to return to the Emergency Department for any new, worsening or concerning symptoms.  ° °

## 2017-12-15 NOTE — ED Provider Notes (Signed)
[place] EMERGENCY DEPARTMENT Provider Note   CSN: [id number] Arrival date & time: 12/15/17  0716     History   Chief Complaint CC: cough  HPI   Blood pressure 102/66, pulse 86, temperature 98.8 F (37.1 C), temperature source Oral, resp. rate 16, SpO2 96 %.  Donald Leblanc is a 62 y.o. male with past medical history significant for COPD, CAD, PVD and seizure disorder complaining of cough onset yesterday with no associated fever, chills, chest pain, rhinorrhea, increasing peripheral edema.  He states that the cough is sometimes intermittently productive, he has noticed a streak of blood in the sputum.  Denies any history of DVT/PE, recent immobilizations, calf pain, leg swelling, palpitations, syncope.  He notes that he has a 1 block claudication in the bilateral legs, he has had this for several months.  He has been meaning to follow-up with vascular surgeon (patient states he has stents and bypasses in the lower extremity).  No recent acute changes in his symptoms.  He states that the cough kept him from sleep last night and that is why he is seeking help.  He takes Symbicort prescribed by his PCP, he does not see a pulmonologist.  Past Medical History:  Diagnosis Date  . Back pain   . Colon polyps   . COPD (chronic obstructive pulmonary disease) ([place])   . Coronary artery disease   . Hx of substance abuse   . PVD (peripheral vascular disease) ([place])   . Seizures ([place])   . Thrombocytosis [facility]     Patient Active Problem List   Diagnosis Date Noted  . Pain in right ankle and joints of right foot 05/04/2017    Past Surgical History:  Procedure Laterality Date  . CORONARY ANGIOPLASTY WITH STENT PLACEMENT    . HAND SURGERY          Home Medications    Prior to Admission medications   Medication Sig Start Date End Date Taking? Authorizing Provider  albuterol (PROVENTIL HFA;VENTOLIN HFA) 108 (90 Base) MCG/ACT inhaler Inhale 1-2 puffs into the lungs  every 6 (six) hours as needed for wheezing or shortness of breath. 12/15/17   Shmuel Girgis, Elmyra Ricks, PA-C  amLODipine (NORVASC) 10 MG tablet Take 10 mg by mouth daily.    [provider]  aspirin EC 81 MG tablet Take 81 mg by mouth daily.    [provider]  benzonatate (TESSALON) 100 MG capsule Take 1 capsule (100 mg total) by mouth every 8 (eight) hours. 11/30/15   Kirichenko, Lahoma Rocker, PA-C  clopidogrel (PLAVIX) 75 MG tablet Take 75 mg by mouth daily.    [provider]  HYDROcodone-acetaminophen (NORCO/VICODIN) 5-325 MG tablet Take 1-2 tablets by mouth every 6 hours as needed for cough. 12/15/17   Allycia Pitz, Elmyra Ricks, PA-C  isosorbide mononitrate (IMDUR) 60 MG 24 hr tablet Take 60 mg by mouth daily.    [provider]  metoprolol (LOPRESSOR) 50 MG tablet Take 50 mg by mouth 2 (two) times daily.    [provider]  oxyCODONE-acetaminophen (PERCOCET/ROXICET) 5-325 MG per tablet Take 1 tablet by mouth every 6 (six) hours as needed. Post op pain    [provider]  phenytoin (DILANTIN) 100 MG ER capsule Take 100 mg by mouth 3 (three) times daily.    [provider]  predniSONE (DELTASONE) 50 MG tablet Take 1 tablet daily with breakfast 12/15/17   Maebel Marasco, Elmyra Ricks, PA-C    Family History Family History  Problem Relation Age of Onset  .  Cancer Mother        Unknown  . Colon cancer Neg Hx   . Pancreatic cancer Neg Hx     Social History Social History   Tobacco Use  . Smoking status: Former Smoker    Types: Cigarettes  . Smokeless tobacco: Never Used  Substance Use Topics  . Alcohol use: No  . Drug use: No     Allergies   Patient has no known allergies.   Review of Systems Review of Systems  A complete review of systems was obtained and all systems are negative except as noted in the HPI and PMH.   Physical Exam Updated Vital Signs BP 102/66 (BP Location: Right Arm)   Pulse 86   Temp 98.8 F (37.1 C) (Oral)   Resp 16    SpO2 96%   Physical Exam  Constitutional: He is oriented to person, place, and time. He appears well-developed and well-nourished. No distress.  HENT:  Head: Normocephalic.  Right Ear: External ear normal.  Left Ear: External ear normal.  Mouth/Throat: Oropharynx is clear and moist. No oropharyngeal exudate.  No drooling or stridor. Posterior pharynx mildly erythematous no significant tonsillar hypertrophy. No exudate. Soft palate rises symmetrically. No TTP or induration under tongue.   No tenderness to palpation of frontal or bilateral maxillary sinuses.  Mild mucosal edema in the nares with scant rhinorrhea.  Bilateral tympanic membranes with normal architecture and good light reflex.    Eyes: Pupils are equal, round, and reactive to light. Conjunctivae and EOM are normal.  Neck: Normal range of motion. Neck supple. No JVD present. No tracheal deviation present.  Cardiovascular: Normal rate, regular rhythm and intact distal pulses.  Radial pulse equal bilaterally  Pulmonary/Chest: Effort normal and breath sounds normal. No stridor. No respiratory distress. He has no wheezes. He has no rales. He exhibits no tenderness.  Abdominal: Soft. He exhibits no distension and no mass. There is no tenderness. There is no rebound and no guarding.  Musculoskeletal: Normal range of motion. He exhibits no edema or tenderness.  No calf asymmetry, superficial collaterals, palpable cords, edema, Homans sign negative bilaterally.    Neurological: He is alert and oriented to person, place, and time.  Skin: Skin is warm. He is not diaphoretic.  Psychiatric: He has a normal mood and affect.  Nursing note and vitals reviewed.    ED Treatments / Results  Labs (all labs ordered are listed, but only abnormal results are displayed) Labs Reviewed - No data to display  EKG None  Radiology Dg Chest 2 View  Result Date: 12/15/2017 CLINICAL DATA:  Cough for 2 days.  History of COPD, ex-smoker. EXAM:  CHEST - 2 VIEW COMPARISON:  Chest x-rays dated 11/30/2015 and 10/11/2015. FINDINGS: Heart size is within normal limits, stable. Overall cardiomediastinal silhouette is stable in size and configuration. Chronic scarring/fibrosis again noted within the right upper lobe, with stable pleuroparenchymal scarring at the right lung apex. Additional coarse lung markings at the lung bases are stable, presumably interstitial thickening related to chronic interstitial lung disease. No new lung findings. No confluent opacity to suggest a developing pneumonia. No pleural effusion or pneumothorax seen. No acute or suspicious osseous finding. IMPRESSION: 1. No active cardiopulmonary disease. No evidence of pneumonia or pulmonary edema. 2. Stable chronic scarring/fibrosis within the right upper lobe. Probable chronic interstitial lung disease at the lung bases, stable. Electronically Signed   By: Franki Cabot M.D.   On: 12/15/2017 08:33    Procedures Procedures (including critical  care time)  Medications Ordered in ED Medications  predniSONE (DELTASONE) tablet 60 mg (has no administration in time range)  albuterol (PROVENTIL HFA;VENTOLIN HFA) 108 (90 Base) MCG/ACT inhaler 2 puff (has no administration in time range)     Initial Impression / Assessment and Plan / ED Course  I have reviewed the triage vital signs and the nursing notes.  Pertinent labs & imaging results that were available during my care of the patient were reviewed by me and considered in my medical decision making (see chart for details).     Vitals:   12/15/17 0733  BP: 102/66  Pulse: 86  Resp: 16  Temp: 98.8 F (37.1 C)  TempSrc: Oral  SpO2: 96%    Medications  predniSONE (DELTASONE) tablet 60 mg (has no administration in time range)  albuterol (PROVENTIL HFA;VENTOLIN HFA) 108 (90 Base) MCG/ACT inhaler 2 puff (has no administration in time range)    Donald Leblanc is 62 y.o. male presenting with productive cough onset  yesterday.  Lung sounds clear with excellent air movement, no wheezing, no peripheral edema.  I doubt that this is PE given his lack of signs and symptoms consistent with DVT, lack of hypoxia, tachycardia, tachypnea.  Likely COPD exacerbation, will start on short course of prednisone, will add on albuterol.  We had an extensive discussion over what is a rescue inhaler versus a preventative inhaler.  I asked this patient to follow closely with primary care, given his scarring on x-ray he may benefit from a pulmonology evaluation.  Evaluation does not show pathology that would require ongoing emergent intervention or inpatient treatment. Pt is hemodynamically stable and mentating appropriately. Discussed findings and plan with patient/guardian, who agrees with care plan. All questions answered. Return precautions discussed and outpatient follow up given.      Final Clinical Impressions(s) / ED Diagnoses   Final diagnoses:  COPD exacerbation [facility])    ED Discharge Orders        Ordered    predniSONE (DELTASONE) 50 MG tablet     12/15/17 0937    albuterol (PROVENTIL HFA;VENTOLIN HFA) 108 (90 Base) MCG/ACT inhaler  Every 6 hours PRN     12/15/17 0937    HYDROcodone-acetaminophen (NORCO/VICODIN) 5-325 MG tablet     12/15/17 0937       Marlie Kuennen, Charna Elizabeth 12/15/17 0940    Deno Etienne, DO 12/15/17 1004

## 2017-12-16 ENCOUNTER — Telehealth: Payer: Self-pay | Admitting: Internal Medicine

## 2017-12-16 NOTE — Telephone Encounter (Signed)
Called and spoke with patient and pt's wife Rona Ravens. Patient was seen in the ED and chest xray showed chronic changes. Both patient and wife very anxious to be seen. We were able to schedule patient with Dr. Melvyn Novas on 12/23/2017 at 1:30 pm. Patient and patient's wife verbalized understanding. Nothing further is needed at this time.

## 2017-12-22 ENCOUNTER — Ambulatory Visit: Payer: Medicaid Other | Admitting: Physical Therapy

## 2017-12-23 ENCOUNTER — Ambulatory Visit (INDEPENDENT_AMBULATORY_CARE_PROVIDER_SITE_OTHER): Payer: No Typology Code available for payment source | Admitting: Internal Medicine

## 2017-12-23 ENCOUNTER — Encounter: Payer: Self-pay | Admitting: Internal Medicine

## 2017-12-23 VITALS — BP 118/68 | HR 87 | Ht 70.5 in | Wt 183.8 lb

## 2017-12-23 DIAGNOSIS — J479 Bronchiectasis, uncomplicated: Secondary | ICD-10-CM | POA: Insufficient documentation

## 2017-12-23 DIAGNOSIS — J449 Chronic obstructive pulmonary disease, unspecified: Secondary | ICD-10-CM | POA: Diagnosis not present

## 2017-12-23 MED ORDER — AMOXICILLIN-POT CLAVULANATE 875-125 MG PO TABS: 1.0000 | ORAL_TABLET | Freq: Two times a day (BID) | ORAL | 0 refills | Status: DC

## 2017-12-23 MED ORDER — BUDESONIDE-FORMOTEROL FUMARATE 80-4.5 MCG/ACT IN AERO: 2.0000 | INHALATION_SPRAY | Freq: Two times a day (BID) | RESPIRATORY_TRACT | 12 refills | Status: DC

## 2017-12-23 MED ORDER — BUDESONIDE-FORMOTEROL FUMARATE 80-4.5 MCG/ACT IN AERO: 2.0000 | INHALATION_SPRAY | Freq: Two times a day (BID) | RESPIRATORY_TRACT | 0 refills | Status: DC

## 2017-12-23 NOTE — Assessment & Plan Note (Addendum)
Quit smoking 1999 - Spirometry 12/23/2017  FEV1 1.70 (54%)  Ratio 81 no significant curvature  - 12/23/2017  After extensive coaching inhaler device  effectiveness =    75% from a baseline near 0 so rx AB component with symb 80 2bid      DDX of  difficult airways management almost all start with A and  include Adherence, Ace Inhibitors, Acid Reflux, Active Sinus Disease, Alpha 1 Antitripsin deficiency, Anxiety masquerading as Airways dz,  ABPA,  Allergy(esp in young), Aspiration (esp in elderly), Adverse effects of meds,  Active smokers, A bunch of PE's (a small clot burden can't cause this syndrome unless there is already severe underlying pulm or vascular dz with poor reserve) plus two Bs  = Bronchiectasis and Beta blocker use..and one C= CHF   Adherence is always the initial "prime suspect" and is a multilayered concern that requires a "trust but verify" approach in every patient - starting with knowing how to use medications, especially inhalers, correctly, keeping up with refills and understanding the fundamental difference between maintenance and prns vs those medications only taken for a very short course and then stopped and not refilled.  - see hfa teaching - return with all meds in hand using a trust but verify approach to confirm accurate Medication  Reconciliation The principal here is that until we are certain that the  patients are doing what we've asked, it makes no sense to ask them to do more.    ? Allergy/asthma > doubt as no resp to prednisone so rx only low dose ics with symbicort 80 for now and return for full pfts before refills   ? Active/ occult sinus dz > augmentin should cover, sinus CT next   ? Acid (or non-acid) GERD > always difficult to exclude as up to 75% of pts in some series report no assoc GI/ Heartburn symptoms> rec consider next adding empirical max (24h)  acid suppression and diet restrictions   ? ABPA   >  cxr not with typical abpa bronchiectasis   ?  Bronchiectasis >>  more likely due to TB treated decades ago   Total time devoted to counseling  > 50 % of initial 60 min office visit:  review case with pt/wife discussion of options/alternatives/ personally creating written customized instructions  in presence of pt  then going over those specific  Instructions directly with the pt including how to use all of the meds but in particular covering each new medication in detail and the difference between the maintenance= "automatic" meds and the prns using an action plan format for the latter (If this problem/symptom => do that organization reading Left to right).  Please see AVS from this visit for a full list of these instructions which I personally wrote for this pt and  are unique to this visit.

## 2017-12-23 NOTE — Assessment & Plan Note (Signed)
-   rx per HD for TB ? When completed > req records 12/23/2017  - CT chest 06/03/09 Findings in the right upper lobe compatible with chronic post infectious or inflammatory bronchiectasis and volume loss.  Likely this more bronchiectasis / tracheobronchitis than the typical "copd" flare and note did not respond to abx or abuterol so next step is augmentin x 10 days and if not better consider HRCT and sinus ct next

## 2017-12-23 NOTE — Progress Notes (Signed)
Subjective:     Patient ID: Donald Leblanc, male   DOB: 13-Sep-1956,    MRN: [id number]  HPI  2 yobm quit smoking 1999 with a cough that seemed to improved then breathing got worse around 2005 some better on albuterol referred to pulmonary clinic 12/23/2017 by Dr Sherral Hammers       12/23/2017 [facility] office visit/ [name]   Chief Complaint  Patient presents with  . Consult    Pt seen in the ED 12/15/17 and from a cxr, chronic scarring was shown. Pt has c/o cough, SOB all the time, and CP.  doe = MMRC3 = can't walk 100 yards even at a slow pace at a flat grade s stopping due to sob   Mailbox back to house  Last used inhaler = saba  around 4 h prior to OV and "helps some"    Much worse  Cough starting around 12/14/17 much worse cough prod yellow mucus blood while on symbicort with nothing mucus > rec   use alb / prednisone  > he misunderstood and stopped albuterol, continues to have severe cough worse at hs    No obvious day to day or daytime variability or assocmucus plugs or  ongoing hemoptysis or cp or chest tightness, subjective wheeze or overt sinus or hb symptoms. No unusual exposure hx or h/o childhood pna/ asthma or knowledge of premature birth.    Also denies any obvious fluctuation of symptoms with weather or environmental changes or other aggravating or alleviating factors except as outlined above   Current Allergies, Complete Past Medical History, Past Surgical History, Family History, and Social History were reviewed in [employer] record.  ROS  The following are not active complaints unless bolded Hoarseness, sore throat, dysphagia, dental problems, itching, sneezing,  nasal congestion or discharge of excess mucus or purulent secretions, ear ache,   fever, chills, sweats, unintended wt loss or wt gain, classically pleuritic or exertional cp,  orthopnea pnd or leg swelling, presyncope, palpitations, abdominal pain, anorexia, nausea, vomiting, diarrhea  or  change in bowel habits or change in bladder habits, change in stools or change in urine, dysuria, hematuria,  rash, arthralgias, visual complaints, headache, numbness, weakness or ataxia or problems with walking or coordination,  change in mood/affect or memory.        Current Meds  Medication Sig  . albuterol (PROVENTIL HFA;VENTOLIN HFA) 108 (90 Base) MCG/ACT inhaler Inhale 1-2 puffs into the lungs every 6 (six) hours as needed for wheezing or shortness of breath.  Marland Kitchen amLODipine (NORVASC) 10 MG tablet Take 10 mg by mouth daily.  Marland Kitchen aspirin EC 81 MG tablet Take 81 mg by mouth daily.  . benzonatate (TESSALON) 100 MG capsule Take 1 capsule (100 mg total) by mouth every 8 (eight) hours.  . clopidogrel (PLAVIX) 75 MG tablet Take 75 mg by mouth daily.  Marland Kitchen HYDROcodone-acetaminophen (NORCO/VICODIN) 5-325 MG tablet Take 1-2 tablets by mouth every 6 hours as needed for cough.  . isosorbide mononitrate (IMDUR) 60 MG 24 hr tablet Take 60 mg by mouth daily.  . metoprolol (LOPRESSOR) 50 MG tablet Take 50 mg by mouth 2 (two) times daily.  . phenytoin (DILANTIN) 100 MG ER capsule Take 100 mg by mouth 3 (three) times daily.      Review of Systems     Objective:   Physical Exam   Obese stoic hoarse amb  bm nad    Wt Readings from Last 3 Encounters:  12/23/17 183 lb 12.8  oz (83.4 kg)  11/08/17 188 lb (85.3 kg)  10/27/17 188 lb 4 oz (85.4 kg)     Vital signs reviewed - Note on arrival 02 sats  93% on RA     HEENT: nl   oropharynx. Nl external ear canals without cough reflex - moderate bilateral non-specific turbinate edema  / edentulous    NECK :  without JVD/Nodes/TM/ nl carotid upstrokes bilaterally   LUNGS: no acc muscle use,  Mod barrel  contour chest wall with bilateral  Distant bs  With scattered insp and exp rhonchi R L> L  and  without cough on insp or exp maneuver and mod   Hyperresonant  to  percussion bilaterally     CV:  RRR  no s3 or murmur or increase in P2, and no edema   ABD:  obese/ soft and nontender with pos late insp Hoover's  in the supine position. No bruits or organomegaly appreciated, bowel sounds nl  MS:   Nl gait/  ext warm without deformities, calf tenderness, cyanosis or clubbing No obvious joint restrictions   SKIN: warm and dry without lesions    NEURO:  alert, approp, nl sensorium with  no motor or cerebellar deficits apparent.       I personally reviewed images and agree with radiology impression as follows:  CXR:   12/15/17  1. No active cardiopulmonary disease. No evidence of pneumonia or pulmonary edema. 2. Stable chronic scarring/fibrosis within the right upper lobe. Probable chronic interstitial lung disease at the lung bases, stable.           Assessment:

## 2017-12-23 NOTE — Patient Instructions (Addendum)
We will need to obtain your TB records from [facility]      Plan A = Automatic = Symbicort 80 Take 2 puffs first thing in am and then another 2 puffs about 12 hours later.   Work on inhaler technique:  relax and gently blow all the way out then take a nice smooth deep breath back in, triggering the inhaler at same time you start breathing in.  Hold for up to 5 seconds if you can. Blow out thru nose. Rinse and gargle with water when done      Plan B = Backup Only use your albuterol (Proair) as a rescue medication to be used if you can't catch your breath by resting or doing a relaxed purse lip breathing pattern.  - The less you use it, the better it will work when you need it. - Ok to use the inhaler up to 2 puffs  every 4 hours if you must but call for appointment if use goes up over your usual need - Don't leave home without it !!  (think of it like the spare tire for your car)    Augmentin 875 mg take one pill twice daily  X 10 days - take at breakfast and supper with large glass of water.  It would help reduce the usual side effects (diarrhea and yeast infections) if you ate cultured yogurt at lunch.    Please schedule a follow up office visit in 4 weeks, sooner if needed  with all medications /inhalers/ solutions in hand so we can verify exactly what you are taking. This includes all medications from all doctors and over the counters  - pfts on return

## 2017-12-24 ENCOUNTER — Telehealth: Payer: Self-pay | Admitting: Internal Medicine

## 2017-12-24 NOTE — Telephone Encounter (Signed)
Rec'd from [facility] Dept No Medical Info forward to Dr. Christinia Gully

## 2017-12-29 ENCOUNTER — Encounter (HOSPITAL_COMMUNITY): Payer: Self-pay | Admitting: Emergency Medicine

## 2017-12-29 ENCOUNTER — Other Ambulatory Visit: Payer: Self-pay

## 2017-12-29 ENCOUNTER — Emergency Department (HOSPITAL_COMMUNITY)
Admission: EM | Admit: 2017-12-29 | Discharge: 2017-12-29 | Disposition: A | Discharge status: Home / Self Care | Payer: Non-veteran care | Attending: Emergency Medicine | Admitting: Emergency Medicine

## 2017-12-29 ENCOUNTER — Inpatient Hospital Stay (HOSPITAL_COMMUNITY)
Admission: EM | Admit: 2017-12-29 | Discharge: 2018-01-01 | DRG: 378 | Disposition: A | Discharge status: Home / Self Care | Payer: Medicaid Other | Attending: Family Medicine | Admitting: Family Medicine

## 2017-12-29 ENCOUNTER — Encounter (HOSPITAL_COMMUNITY): Payer: Self-pay | Admitting: Family Medicine

## 2017-12-29 DIAGNOSIS — J449 Chronic obstructive pulmonary disease, unspecified: Secondary | ICD-10-CM | POA: Diagnosis present

## 2017-12-29 DIAGNOSIS — R040 Epistaxis: Secondary | ICD-10-CM | POA: Diagnosis present

## 2017-12-29 DIAGNOSIS — Z7902 Long term (current) use of antithrombotics/antiplatelets: Secondary | ICD-10-CM | POA: Diagnosis not present

## 2017-12-29 DIAGNOSIS — Z86010 Personal history of colonic polyps: Secondary | ICD-10-CM | POA: Diagnosis not present

## 2017-12-29 DIAGNOSIS — Z7982 Long term (current) use of aspirin: Secondary | ICD-10-CM

## 2017-12-29 DIAGNOSIS — F1120 Opioid dependence, uncomplicated: Secondary | ICD-10-CM | POA: Diagnosis present

## 2017-12-29 DIAGNOSIS — K648 Other hemorrhoids: Secondary | ICD-10-CM | POA: Diagnosis present

## 2017-12-29 DIAGNOSIS — K922 Gastrointestinal hemorrhage, unspecified: Secondary | ICD-10-CM | POA: Diagnosis not present

## 2017-12-29 DIAGNOSIS — R05 Cough: Secondary | ICD-10-CM

## 2017-12-29 DIAGNOSIS — Z809 Family history of malignant neoplasm, unspecified: Secondary | ICD-10-CM | POA: Diagnosis not present

## 2017-12-29 DIAGNOSIS — Z87891 Personal history of nicotine dependence: Secondary | ICD-10-CM | POA: Diagnosis not present

## 2017-12-29 DIAGNOSIS — Z95820 Peripheral vascular angioplasty status with implants and grafts: Secondary | ICD-10-CM

## 2017-12-29 DIAGNOSIS — Z79899 Other long term (current) drug therapy: Secondary | ICD-10-CM | POA: Insufficient documentation

## 2017-12-29 DIAGNOSIS — Z7901 Long term (current) use of anticoagulants: Secondary | ICD-10-CM | POA: Diagnosis not present

## 2017-12-29 DIAGNOSIS — G40909 Epilepsy, unspecified, not intractable, without status epilepticus: Secondary | ICD-10-CM | POA: Diagnosis present

## 2017-12-29 DIAGNOSIS — D62 Acute posthemorrhagic anemia: Secondary | ICD-10-CM | POA: Diagnosis present

## 2017-12-29 DIAGNOSIS — I739 Peripheral vascular disease, unspecified: Secondary | ICD-10-CM | POA: Diagnosis present

## 2017-12-29 DIAGNOSIS — Z955 Presence of coronary angioplasty implant and graft: Secondary | ICD-10-CM | POA: Insufficient documentation

## 2017-12-29 DIAGNOSIS — K317 Polyp of stomach and duodenum: Secondary | ICD-10-CM | POA: Diagnosis present

## 2017-12-29 DIAGNOSIS — I2510 Atherosclerotic heart disease of native coronary artery without angina pectoris: Secondary | ICD-10-CM

## 2017-12-29 DIAGNOSIS — G894 Chronic pain syndrome: Secondary | ICD-10-CM | POA: Diagnosis present

## 2017-12-29 DIAGNOSIS — R059 Cough, unspecified: Secondary | ICD-10-CM

## 2017-12-29 DIAGNOSIS — K921 Melena: Secondary | ICD-10-CM | POA: Diagnosis present

## 2017-12-29 DIAGNOSIS — R197 Diarrhea, unspecified: Secondary | ICD-10-CM | POA: Diagnosis not present

## 2017-12-29 LAB — COMPREHENSIVE METABOLIC PANEL
ALT: 29 U/L (ref 17–63)
AST: 20 U/L (ref 15–41)
Albumin: 3.4 g/dL — ABNORMAL LOW (ref 3.5–5.0)
Alkaline Phosphatase: 93 U/L (ref 38–126)
Anion gap: 9 (ref 5–15)
BUN: 27 mg/dL — AB (ref 6–20)
CHLORIDE: 107 mmol/L (ref 101–111)
CO2: 22 mmol/L (ref 22–32)
CREATININE: 1.32 mg/dL — AB (ref 0.61–1.24)
Calcium: 9.2 mg/dL (ref 8.9–10.3)
GFR, EST NON AFRICAN AMERICAN: 57 mL/min — AB (ref >60)
Glucose, Bld: 107 mg/dL — ABNORMAL HIGH (ref 65–99)
Potassium: 4 mmol/L (ref 3.5–5.1)
Sodium: 138 mmol/L (ref 135–145)
Total Bilirubin: 0.7 mg/dL (ref 0.3–1.2)
Total Protein: 7.8 g/dL (ref 6.5–8.1)

## 2017-12-29 LAB — POC OCCULT BLOOD, ED: FECAL OCCULT BLD: POSITIVE — AB

## 2017-12-29 LAB — TYPE AND SCREEN
ABO/RH(D): O NEG
Antibody Screen: NEGATIVE

## 2017-12-29 LAB — CBC
HCT: 36.4 % — ABNORMAL LOW (ref 39.0–52.0)
Hemoglobin: 11.2 g/dL — ABNORMAL LOW (ref 13.0–17.0)
MCH: 25.1 pg — AB (ref 26.0–34.0)
MCHC: 30.8 g/dL (ref 30.0–36.0)
MCV: 81.6 fL (ref 78.0–100.0)
PLATELETS: 478 10*3/uL — AB (ref 150–400)
RBC: 4.46 MIL/uL (ref 4.22–5.81)
RDW: 15.3 % (ref 11.5–15.5)
WBC: 13.3 10*3/uL — AB (ref 4.0–10.5)

## 2017-12-29 LAB — PROTIME-INR
INR: 1.04
Prothrombin Time: 13.5 seconds (ref 11.4–15.2)

## 2017-12-29 LAB — ABO/RH: ABO/RH(D): O NEG

## 2017-12-29 MED ORDER — PANTOPRAZOLE SODIUM 40 MG IV SOLR
40.0000 mg | Freq: Two times a day (BID) | INTRAVENOUS | Status: DC
  Administered 2017-12-30 (×2): 40 mg via INTRAVENOUS
  Filled 2017-12-29 (×2): qty 40

## 2017-12-29 MED ORDER — SODIUM CHLORIDE 0.9 % IV SOLN
INTRAVENOUS | Status: DC
  Administered 2017-12-30 (×2): via INTRAVENOUS

## 2017-12-29 MED ORDER — ONDANSETRON HCL 4 MG PO TABS: 4.0000 mg | ORAL_TABLET | Freq: Four times a day (QID) | ORAL | Status: DC | PRN

## 2017-12-29 MED ORDER — ISOSORBIDE MONONITRATE ER 60 MG PO TB24
60.0000 mg | ORAL_TABLET | Freq: Every day | ORAL | Status: DC
  Administered 2017-12-30 – 2018-01-01 (×3): 60 mg via ORAL
  Filled 2017-12-29 (×3): qty 1

## 2017-12-29 MED ORDER — ONDANSETRON HCL 4 MG/2ML IJ SOLN: 4.0000 mg | Freq: Four times a day (QID) | INTRAMUSCULAR | Status: DC | PRN

## 2017-12-29 MED ORDER — BENZONATATE 100 MG PO CAPS
100.0000 mg | ORAL_CAPSULE | Freq: Three times a day (TID) | ORAL | Status: DC
  Administered 2017-12-30 – 2018-01-01 (×8): 100 mg via ORAL
  Filled 2017-12-29 (×7): qty 1

## 2017-12-29 MED ORDER — OXYMETAZOLINE HCL 0.05 % NA SOLN
1.0000 | Freq: Once | NASAL | Status: AC
  Administered 2017-12-29: 1 via NASAL
  Filled 2017-12-29: qty 15

## 2017-12-29 MED ORDER — ACETAMINOPHEN 650 MG RE SUPP: 650.0000 mg | Freq: Four times a day (QID) | RECTAL | Status: DC | PRN

## 2017-12-29 MED ORDER — SILVER NITRATE-POT NITRATE 75-25 % EX MISC
1.0000 | Freq: Once | CUTANEOUS | Status: DC
  Filled 2017-12-29: qty 1

## 2017-12-29 MED ORDER — HYDROCODONE-ACETAMINOPHEN 5-325 MG PO TABS
1.0000 | ORAL_TABLET | ORAL | Status: DC | PRN
  Administered 2017-12-31: 1 via ORAL
  Administered 2017-12-31 – 2018-01-01 (×2): 2 via ORAL
  Filled 2017-12-29: qty 1
  Filled 2017-12-29 (×2): qty 2

## 2017-12-29 MED ORDER — MOMETASONE FURO-FORMOTEROL FUM 100-5 MCG/ACT IN AERO
2.0000 | INHALATION_SPRAY | Freq: Two times a day (BID) | RESPIRATORY_TRACT | Status: DC
  Administered 2017-12-30 – 2018-01-01 (×5): 2 via RESPIRATORY_TRACT
  Filled 2017-12-29: qty 8.8

## 2017-12-29 MED ORDER — ZOLPIDEM TARTRATE 5 MG PO TABS: 5.0000 mg | ORAL_TABLET | Freq: Every evening | ORAL | Status: DC | PRN

## 2017-12-29 MED ORDER — HYDROCODONE-ACETAMINOPHEN 5-325 MG PO TABS: 1.0000 | ORAL_TABLET | Freq: Four times a day (QID) | ORAL | Status: DC | PRN

## 2017-12-29 MED ORDER — ALBUTEROL SULFATE HFA 108 (90 BASE) MCG/ACT IN AERS: 1.0000 | INHALATION_SPRAY | Freq: Four times a day (QID) | RESPIRATORY_TRACT | Status: DC | PRN

## 2017-12-29 MED ORDER — ACETAMINOPHEN 325 MG PO TABS
650.0000 mg | ORAL_TABLET | Freq: Four times a day (QID) | ORAL | Status: DC | PRN
  Filled 2017-12-29: qty 2

## 2017-12-29 MED ORDER — AMLODIPINE BESYLATE 10 MG PO TABS
10.0000 mg | ORAL_TABLET | Freq: Every day | ORAL | Status: DC
  Administered 2017-12-30 – 2018-01-01 (×3): 10 mg via ORAL
  Filled 2017-12-29 (×3): qty 1

## 2017-12-29 MED ORDER — METOPROLOL TARTRATE 50 MG PO TABS
50.0000 mg | ORAL_TABLET | Freq: Two times a day (BID) | ORAL | Status: DC
  Administered 2017-12-30 – 2018-01-01 (×6): 50 mg via ORAL
  Filled 2017-12-29 (×6): qty 1

## 2017-12-29 NOTE — ED Provider Notes (Signed)
[place] DEPT Provider Note: [name], MD, FACEP  CSN: [id number] MRN: [id number] ARRIVAL: 12/29/17 at [place]: [place]  Epistaxis   HISTORY OF PRESENT ILLNESS  12/29/17 5:14 AM Jahrell Hamor is a 62 y.o. male    Past Medical History:  Diagnosis Date  . Back pain   . Colon polyps   . COPD (chronic obstructive pulmonary disease) ([place])   . Coronary artery disease   . Hx of substance abuse   . PVD (peripheral vascular disease) ([place])   . Seizures ([place])   . Thrombocytosis ([place])     Past Surgical History:  Procedure Laterality Date  . CORONARY ANGIOPLASTY WITH STENT PLACEMENT    . HAND SURGERY      Family History  Problem Relation Age of Onset  . Cancer Mother        Unknown  . Colon cancer Neg Hx   . Pancreatic cancer Neg Hx     Social History   Tobacco Use  . Smoking status: Former Smoker    Packs/day: 1.50    Years: 20.00    Pack years: 30.00    Types: Cigarettes    Last attempt to quit: 12/23/1997    Years since quitting: 20.0  . Smokeless tobacco: Never Used  Substance Use Topics  . Alcohol use: No  . Drug use: No    Prior to Admission medications   Medication Sig Start Date End Date Taking? Authorizing Provider  albuterol (PROVENTIL HFA;VENTOLIN HFA) 108 (90 Base) MCG/ACT inhaler Inhale 1-2 puffs into the lungs every 6 (six) hours as needed for wheezing or shortness of breath. 12/15/17  Yes Pisciotta, Elmyra Ricks, PA-C  amLODipine (NORVASC) 10 MG tablet Take 10 mg by mouth daily.   Yes [provider]  amoxicillin-clavulanate (AUGMENTIN) 875-125 MG tablet Take 1 tablet by mouth 2 (two) times daily for 10 days. 12/23/17 01/02/18 Yes Tanda Rockers, MD  aspirin EC 81 MG tablet Take 81 mg by mouth daily.   Yes [provider]  benzonatate (TESSALON) 100 MG capsule Take 1 capsule (100 mg total) by mouth every 8 (eight) hours. Patient taking differently: Take 100 mg by mouth 3 (three) times daily as needed for  cough.  11/30/15  Yes Kirichenko, Tatyana, PA-C  budesonide-formoterol (SYMBICORT) 80-4.5 MCG/ACT inhaler Inhale 2 puffs into the lungs 2 (two) times daily. 12/23/17  Yes Tanda Rockers, MD  clopidogrel (PLAVIX) 75 MG tablet Take 75 mg by mouth daily.   Yes [provider]  HYDROcodone-acetaminophen (NORCO/VICODIN) 5-325 MG tablet Take 1-2 tablets by mouth every 6 hours as needed for cough. 12/15/17  Yes Pisciotta, Elmyra Ricks, PA-C  isosorbide mononitrate (IMDUR) 60 MG 24 hr tablet Take 60 mg by mouth daily.   Yes [provider]  metoprolol (LOPRESSOR) 50 MG tablet Take 50 mg by mouth 2 (two) times daily.   Yes [provider]    Allergies Patient has no known allergies.   REVIEW OF SYSTEMS  Negative except as noted here or in the History of Present Illness.   PHYSICAL EXAMINATION  Initial Vital Signs Blood pressure 119/76, pulse (!) 127, resp. rate 20, SpO2 95 %.  Examination General: Well-developed, well-nourished male in no acute distress; appearance consistent with age of record HENT: normocephalic; atraumatic Eyes: pupils equal, round and reactive to light; extraocular muscles intact Neck: supple Heart: regular rate and rhythm; no murmurs, rubs or gallops Lungs: clear to auscultation bilaterally Abdomen: soft; nondistended; nontender; no masses or hepatosplenomegaly;  bowel sounds present Extremities: No deformity; full range of motion; pulses normal Neurologic: Awake, alert and oriented; motor function intact in all extremities and symmetric; no facial droop Skin: Warm and dry Psychiatric: Normal mood and affect   RESULTS  Summary of this visit's results, reviewed by myself:   EKG Interpretation  Date/Time:    Ventricular Rate:    PR Interval:    QRS Duration:   QT Interval:    QTC Calculation:   R Axis:     Text Interpretation:        Laboratory Studies: No results found for this or any previous visit (from the past 24 hour(s)). Imaging  Studies: No results found.  ED COURSE  Nursing notes and initial vitals signs, including pulse oximetry, reviewed.  Vitals:   12/29/17 0504  BP: 119/76  Pulse: (!) 127  Resp: 20  SpO2: 95%   6:32 AM Patient's nose remains hemostatic after instillation of oxymetazoline.  He was given the remainder of the oxymetazoline and instructed in how to drip it into his nose.  He was advised to avoid sniffing or spraying as these may dislodge a clot.  His tachycardia has improved and he is not hypotensive or subjectively orthostatic.  PROCEDURES    ED DIAGNOSES     ICD-10-CM   1. Right-sided epistaxis R04.0        Annlee Glandon, Jenny Reichmann, MD 12/29/17 [phone]

## 2017-12-29 NOTE — ED Provider Notes (Signed)
[place] DEPT Provider Note: [name], MD, FACEP  CSN: [id number] MRN: [id number] ARRIVAL: 12/29/17 at [place]: [facility]  Epistaxis   HISTORY OF PRESENT ILLNESS  12/29/17 5:15 AM Donald Leblanc is a 62 y.o. male who complains of a nosebleed since yesterday evening it 11 PM.  The bleeding has been from his right naris.  The bleeding is been intermittent.  It is improved with pressure but restarts when he coughs.  Bleeding has been moderate and there is a large clot in his right naris.  He denies trauma to his nose.  He has had bleeding from the right naris in the past.  He is having a headache.  He is on Plavix.  The patient passed the large clot from his naris soon after arrival and states the bleeding has since stopped.  Past Medical History:  Diagnosis Date  . Back pain   . Colon polyps   . COPD (chronic obstructive pulmonary disease) ([place])   . Coronary artery disease   . Hx of substance abuse   . PVD (peripheral vascular disease) ([place])   . Seizures ([place])   . Thrombocytosis ([place])     Past Surgical History:  Procedure Laterality Date  . CORONARY ANGIOPLASTY WITH STENT PLACEMENT    . HAND SURGERY      Family History  Problem Relation Age of Onset  . Cancer Mother        Unknown  . Colon cancer Neg Hx   . Pancreatic cancer Neg Hx     Social History   Tobacco Use  . Smoking status: Former Smoker    Packs/day: 1.50    Years: 20.00    Pack years: 30.00    Types: Cigarettes    Last attempt to quit: 12/23/1997    Years since quitting: 20.0  . Smokeless tobacco: Never Used  Substance Use Topics  . Alcohol use: No  . Drug use: No    Prior to Admission medications   Medication Sig Start Date End Date Taking? Authorizing Provider  albuterol (PROVENTIL HFA;VENTOLIN HFA) 108 (90 Base) MCG/ACT inhaler Inhale 1-2 puffs into the lungs every 6 (six) hours as needed for wheezing or shortness of breath. 12/15/17  Yes Pisciotta, Elmyra Ricks, PA-C    amLODipine (NORVASC) 10 MG tablet Take 10 mg by mouth daily.   Yes [provider]  amoxicillin-clavulanate (AUGMENTIN) 875-125 MG tablet Take 1 tablet by mouth 2 (two) times daily for 10 days. 12/23/17 01/02/18 Yes Tanda Rockers, MD  aspirin EC 81 MG tablet Take 81 mg by mouth daily.   Yes [provider]  benzonatate (TESSALON) 100 MG capsule Take 1 capsule (100 mg total) by mouth every 8 (eight) hours. Patient taking differently: Take 100 mg by mouth 3 (three) times daily as needed for cough.  11/30/15  Yes Kirichenko, Tatyana, PA-C  budesonide-formoterol (SYMBICORT) 80-4.5 MCG/ACT inhaler Inhale 2 puffs into the lungs 2 (two) times daily. 12/23/17  Yes Tanda Rockers, MD  clopidogrel (PLAVIX) 75 MG tablet Take 75 mg by mouth daily.   Yes [provider]  HYDROcodone-acetaminophen (NORCO/VICODIN) 5-325 MG tablet Take 1-2 tablets by mouth every 6 hours as needed for cough. 12/15/17  Yes Pisciotta, Elmyra Ricks, PA-C  isosorbide mononitrate (IMDUR) 60 MG 24 hr tablet Take 60 mg by mouth daily.   Yes [provider]  metoprolol (LOPRESSOR) 50 MG tablet Take 50 mg by mouth 2 (two) times daily.   Yes [provider]  Allergies Patient has no known allergies.   REVIEW OF SYSTEMS  Negative except as noted here or in the History of Present Illness.   PHYSICAL EXAMINATION  Initial Vital Signs Blood pressure 119/76, pulse (!) 127, resp. rate 20, SpO2 95 %.  Examination General: Well-developed, well-nourished male in no acute distress; appearance consistent with age of record HENT: normocephalic; atraumatic; dry blood on right nasal septum; no blood in oropharynx Eyes: pupils equal, round and reactive to light; extraocular muscles intact Neck: supple Heart: regular rate and rhythm; tachycardia Lungs: clear to auscultation bilaterally Abdomen: soft; nondistended; nontender; bowel sounds present Extremities: No deformity; full range of motion; pulses  normal Neurologic: Awake, alert and oriented; motor function intact in all extremities and symmetric; no facial droop Skin: Warm and dry Psychiatric: Normal mood and affect   RESULTS  Summary of this visit's results, reviewed by myself:   EKG Interpretation  Date/Time:    Ventricular Rate:    PR Interval:    QRS Duration:   QT Interval:    QTC Calculation:   R Axis:     Text Interpretation:        Laboratory Studies: No results found for this or any previous visit (from the past 24 hour(s)). Imaging Studies: No results found.  ED COURSE  Nursing notes and initial vitals signs, including pulse oximetry, reviewed.  Vitals:   12/29/17 0504  BP: 119/76  Pulse: (!) 127  Resp: 20  SpO2: 95%   6:22 AM The patient remains hemostatic after installation of oxymetazoline.  He was given the remainder of the oxymetazoline and instructed in its use.  He was advised to drip it into his nose and not spray or sniff as these may dislodge a clot.  Tachycardia has improved.  PROCEDURES    ED DIAGNOSES     ICD-10-CM   1. Right-sided epistaxis R04.0        Donald Leblanc, Jenny Reichmann, MD 12/29/17 [phone]

## 2017-12-29 NOTE — ED Provider Notes (Signed)
[place] DEPT Provider Note   CSN: [id number] Arrival date & time: 12/29/17  1812     History   Chief Complaint Chief Complaint  Patient presents with  . Rectal Bleeding    HPI Donald Leblanc is a 62 y.o. male.  HPI Patient presents to the emergency room for evaluation of dark stools.  Patient states he takes Plavix.  He actually was in the emergency room earlier today because of a nosebleed.  When he went home he noticed that he was having loose jet black stools.  He denied any bright red blood.  No nausea or vomiting.  Patient denies any fatigue or lightheadedness.  He denies any prior history of GI bleeding. Past Medical History:  Diagnosis Date  . Back pain   . Colon polyps   . COPD (chronic obstructive pulmonary disease) ([place])   . Coronary artery disease   . Hx of substance abuse   . PVD (peripheral vascular disease) ([place])   . Seizures ([place])   . Thrombocytosis [facility])     Patient Active Problem List   Diagnosis Date Noted  . COPD GOLD 0 12/23/2017  . Bronchiectasis without complication ([place]) 19/41/7408  . Pain in right ankle and joints of right foot 05/04/2017    Past Surgical History:  Procedure Laterality Date  . CORONARY ANGIOPLASTY WITH STENT PLACEMENT    . HAND SURGERY          Home Medications    Prior to Admission medications   Medication Sig Start Date End Date Taking? Authorizing Provider  albuterol (PROVENTIL HFA;VENTOLIN HFA) 108 (90 Base) MCG/ACT inhaler Inhale 1-2 puffs into the lungs every 6 (six) hours as needed for wheezing or shortness of breath. 12/15/17  Yes Pisciotta, Elmyra Ricks, PA-C  amLODipine (NORVASC) 10 MG tablet Take 10 mg by mouth daily.   Yes [provider]  amoxicillin-clavulanate (AUGMENTIN) 875-125 MG tablet Take 1 tablet by mouth 2 (two) times daily for 10 days. 12/23/17 01/02/18 Yes Tanda Rockers, MD  aspirin EC 81 MG tablet Take 81 mg by mouth daily.   Yes [provider]    benzonatate (TESSALON) 100 MG capsule Take 1 capsule (100 mg total) by mouth every 8 (eight) hours. Patient taking differently: Take 100 mg by mouth 3 (three) times daily as needed for cough.  11/30/15  Yes Kirichenko, Tatyana, PA-C  budesonide-formoterol (SYMBICORT) 80-4.5 MCG/ACT inhaler Inhale 2 puffs into the lungs 2 (two) times daily. 12/23/17  Yes Tanda Rockers, MD  clopidogrel (PLAVIX) 75 MG tablet Take 75 mg by mouth daily.   Yes [provider]  HYDROcodone-acetaminophen (NORCO/VICODIN) 5-325 MG tablet Take 1-2 tablets by mouth every 6 hours as needed for cough. 12/15/17  Yes Pisciotta, Elmyra Ricks, PA-C  isosorbide mononitrate (IMDUR) 60 MG 24 hr tablet Take 60 mg by mouth daily.   Yes [provider]  metoprolol (LOPRESSOR) 50 MG tablet Take 50 mg by mouth 2 (two) times daily.   Yes [provider]    Family History Family History  Problem Relation Age of Onset  . Cancer Mother        Unknown  . Colon cancer Neg Hx   . Pancreatic cancer Neg Hx     Social History Social History   Tobacco Use  . Smoking status: Former Smoker    Packs/day: 1.50    Years: 20.00    Pack years: 30.00    Types: Cigarettes    Last attempt to quit: 12/23/1997  Years since quitting: 20.0  . Smokeless tobacco: Never Used  Substance Use Topics  . Alcohol use: No  . Drug use: No     Allergies   Patient has no known allergies.   Review of Systems Review of Systems  All other systems reviewed and are negative.    Physical Exam Updated Vital Signs BP 121/80   Pulse (!) 105   Temp 98.5 F (36.9 C) (Oral)   Resp 20   Ht 1.791 m (5' 10.5")   Wt 83 kg (183 lb)   SpO2 98%   BMI 25.89 kg/m   Physical Exam  Constitutional: He appears well-developed and well-nourished. No distress.  HENT:  Head: Normocephalic and atraumatic.  Right Ear: External ear normal.  Left Ear: External ear normal.  Eyes: Conjunctivae are normal. Right eye exhibits no discharge. Left  eye exhibits no discharge. No scleral icterus.  Neck: Neck supple. No tracheal deviation present.  Cardiovascular: Regular rhythm and intact distal pulses. Tachycardia present.  Pulmonary/Chest: Effort normal and breath sounds normal. No stridor. No respiratory distress. He has no wheezes. He has no rales.  Abdominal: Soft. Bowel sounds are normal. He exhibits no distension. There is no tenderness. There is no rebound and no guarding.  Genitourinary: Rectal exam shows guaiac positive stool.  Genitourinary Comments: Melena stool  Musculoskeletal: He exhibits no edema or tenderness.  Neurological: He is alert. He has normal strength. No cranial nerve deficit (no facial droop, extraocular movements intact, no slurred speech) or sensory deficit. He exhibits normal muscle tone. He displays no seizure activity. Coordination normal.  Skin: Skin is warm and dry. No rash noted.  Psychiatric: He has a normal mood and affect.  Nursing note and vitals reviewed.    ED Treatments / Results  Labs (all labs ordered are listed, but only abnormal results are displayed) Labs Reviewed  COMPREHENSIVE METABOLIC PANEL - Abnormal; Notable for the following components:      Result Value   Glucose, Bld 107 (*)    BUN 27 (*)    Creatinine, Ser 1.32 (*)    Albumin 3.4 (*)    GFR calc non Af Amer 57 (*)    All other components within normal limits  CBC - Abnormal; Notable for the following components:   WBC 13.3 (*)    Hemoglobin 11.2 (*)    HCT 36.4 (*)    MCH 25.1 (*)    Platelets 478 (*)    All other components within normal limits  POC OCCULT BLOOD, ED - Abnormal; Notable for the following components:   Fecal Occult Bld POSITIVE (*)    All other components within normal limits  PROTIME-INR  TYPE AND SCREEN     Procedures Procedures (including critical care time)  Medications Ordered in ED Medications - No data to display   Initial Impression / Assessment and Plan / ED Course  I have reviewed  the triage vital signs and the nursing notes.  Pertinent labs & imaging results that were available during my care of the patient were reviewed by me and considered in my medical decision making (see chart for details).  Clinical Course as of Dec 29 2116  Wed Dec 29, 2017  2118 Anemia is stable.  Increased bun correlates with ugi bleed.  Heme pos stool   [JK]    Clinical Course User Index [JK] Dorie Rank, MD  Patient presented to the emergency room with complaints of dark black stools.  Patient was in the hospital recently  for epistaxis.  I guess it would be possible that this could be related to swallowed blood however I am concerned with his degree of melena.  He is tachycardic and is on Plavix.  I will consult with gastroenterology and the medical service for admission for observation and serial evaluation for possible UGI bleed  Final Clinical Impressions(s) / ED Diagnoses   Final diagnoses:  Upper GI bleeding      Dorie Rank, MD 12/29/17 2118

## 2017-12-29 NOTE — ED Triage Notes (Signed)
Per EMS , pt. From home with complaint of epistaxis which started at 11pm last evening. Pt. Denied fever,denied headache nor HTN. Pt. Also denied of any blood thinner but has Plavix listed as his daily med. Received 2 doses of afrin spray to right nare via EMS, no relief.

## 2017-12-29 NOTE — H&P (Signed)
[facility]                                                                                    Patient Demographics  Caysen Whang, is a 62 y.o. male  CSN: [id number]  MRN: [id number]  DOB - [birth year]  Admit Date - 12/29/2017  Outpatient Primary MD for the patient is Windy Fast, MD   With History of -  Past Medical History:  Diagnosis Date  . Back pain   . Colon polyps   . COPD (chronic obstructive pulmonary disease) ([place])   . Coronary artery disease   . Hx of substance abuse   . PVD (peripheral vascular disease) ([place])   . Seizures ([place])   . Thrombocytosis ([place])       Past Surgical History:  Procedure Laterality Date  . CORONARY ANGIOPLASTY WITH STENT PLACEMENT    . HAND SURGERY      in for   Chief Complaint  Patient presents with  . Rectal Bleeding     HPI  Raheel Kunkle  is a 62 y.o. male, with past medical history significant for coronary artery disease status post stent placement on Plavix, peripheral vascular disease and COPD presenting for evaluation of dark stools that was noted 2 days ago.  The patient was seen in the emergency room earlier for epistaxis status post oxymetazolin.  The patient was seen by gastroenterology and had a colonoscopy around 2 months ago which was negative.  Patient was admitted for evaluation for GI bleed, hemoglobin is stable.    Review of Systems    In addition to the HPI above,  No Fever-chills, No Headache, No changes with Vision or hearing, No problems swallowing food or Liquids, No Chest pain, or Shortness of Breath, No Abdominal pain, No Nausea or Vommitting, Bowel movements are regular, No Blood in stool or Urine, No dysuria, No new skin rashes or bruises, No new joints pains-aches,  No new weakness, tingling, numbness in any extremity, No recent weight gain or loss, No polyuria, polydypsia or polyphagia, No significant Mental Stressors.  A full 10 point Review of Systems was done,  except as stated above, all other Review of Systems were negative.   Social History Social History   Tobacco Use  . Smoking status: Former Smoker    Packs/day: 1.50    Years: 20.00    Pack years: 30.00    Types: Cigarettes    Last attempt to quit: 12/23/1997    Years since quitting: 20.0  . Smokeless tobacco: Never Used  Substance Use Topics  . Alcohol use: No     Family History Family History  Problem Relation Age of Onset  . Cancer Mother        Unknown  . Colon cancer Neg Hx   . Pancreatic cancer Neg Hx      Prior to Admission medications   Medication Sig Start Date End Date Taking? Authorizing Provider  albuterol (PROVENTIL HFA;VENTOLIN HFA) 108 (90 Base) MCG/ACT inhaler Inhale 1-2 puffs into the lungs every 6 (six) hours as needed for wheezing or shortness of breath. 12/15/17  Yes Pisciotta, Elmyra Ricks, PA-C  amLODipine (NORVASC) 10 MG tablet  Take 10 mg by mouth daily.   Yes [provider]  amoxicillin-clavulanate (AUGMENTIN) 875-125 MG tablet Take 1 tablet by mouth 2 (two) times daily for 10 days. 12/23/17 01/02/18 Yes Tanda Rockers, MD  aspirin EC 81 MG tablet Take 81 mg by mouth daily.   Yes [provider]  benzonatate (TESSALON) 100 MG capsule Take 1 capsule (100 mg total) by mouth every 8 (eight) hours. Patient taking differently: Take 100 mg by mouth 3 (three) times daily as needed for cough.  11/30/15  Yes Kirichenko, Tatyana, PA-C  budesonide-formoterol (SYMBICORT) 80-4.5 MCG/ACT inhaler Inhale 2 puffs into the lungs 2 (two) times daily. 12/23/17  Yes Tanda Rockers, MD  clopidogrel (PLAVIX) 75 MG tablet Take 75 mg by mouth daily.   Yes [provider]  HYDROcodone-acetaminophen (NORCO/VICODIN) 5-325 MG tablet Take 1-2 tablets by mouth every 6 hours as needed for cough. 12/15/17  Yes Pisciotta, Elmyra Ricks, PA-C  isosorbide mononitrate (IMDUR) 60 MG 24 hr tablet Take 60 mg by mouth daily.   Yes [provider]  metoprolol (LOPRESSOR) 50 MG  tablet Take 50 mg by mouth 2 (two) times daily.   Yes [provider]    No Known Allergies  Physical Exam  Vitals  Blood pressure 130/77, pulse (!) 102, temperature 98.5 F (36.9 C), temperature source Oral, resp. rate 17, height 5' 10.5" (1.791 m), weight 83 kg (183 lb), SpO2 96 %.   1. General well-developed, well-nourished gentleman, very pleasant in no acute distress  2. Normal affect and insight, Not Suicidal or Homicidal, Awake Alert, Oriented X 3.  3. No F.N deficits, patient moving all extremities.  4. Ears and Eyes appear Normal, Conjunctivae clear, PERRLA. Moist Oral Mucosa.  5. Supple Neck, No JVD, No cervical lymphadenopathy appriciated, No Carotid Bruits.  6. Symmetrical Chest wall movement, Good air movement bilaterally, CTAB.  7. RRR, No Gallops, Rubs or Murmurs, No Parasternal Heave.  8. Positive Bowel Sounds, Abdomen Soft, Non tender, No organomegaly appriciated,No rebound -guarding or rigidity.  9.  No Cyanosis, Normal Skin Turgor, No Skin Rash or Bruise.  10. Good muscle tone,  joints appear normal , no effusions, Normal ROM.    Data Review  CBC Recent Labs  Lab 12/29/17 1900  WBC 13.3*  HGB 11.2*  HCT 36.4*  PLT 478*  MCV 81.6  MCH 25.1*  MCHC 30.8  RDW 15.3   ------------------------------------------------------------------------------------------------------------------  Chemistries  Recent Labs  Lab 12/29/17 1900  NA 138  K 4.0  CL 107  CO2 22  GLUCOSE 107*  BUN 27*  CREATININE 1.32*  CALCIUM 9.2  AST 20  ALT 29  ALKPHOS 93  BILITOT 0.7   ------------------------------------------------------------------------------------------------------------------ estimated creatinine clearance is 61.7 mL/min (A) (by C-G formula based on SCr of 1.32 mg/dL (H)). ------------------------------------------------------------------------------------------------------------------ No results for input(s): TSH, T4TOTAL, T3FREE,  THYROIDAB in the last 72 hours.  Invalid input(s): FREET3   Coagulation profile Recent Labs  Lab 12/29/17 1900  INR 1.04   ------------------------------------------------------------------------------------------------------------------- No results for input(s): DDIMER in the last 72 hours. -------------------------------------------------------------------------------------------------------------------  Cardiac Enzymes No results for input(s): CKMB, TROPONINI, MYOGLOBIN in the last 168 hours.  Invalid input(s): CK ------------------------------------------------------------------------------------------------------------------ Invalid input(s): POCBNP   ---------------------------------------------------------------------------------------------------------------  Urinalysis    Component Value Date/Time   COLORURINE YELLOW 06/03/2009 0412   APPEARANCEUR CLEAR 06/03/2009 0412   LABSPEC 1.012 06/03/2009 0412   PHURINE 7.0 06/03/2009 0412   GLUCOSEU 100 (A) 06/03/2009 0412   HGBUR NEGATIVE 06/03/2009 0412   BILIRUBINUR NEGATIVE  06/03/2009 0412   KETONESUR NEGATIVE 06/03/2009 0412   PROTEINUR NEGATIVE 06/03/2009 0412   UROBILINOGEN 1.0 06/03/2009 0412   NITRITE NEGATIVE 06/03/2009 0412   LEUKOCYTESUR  06/03/2009 0412    NEGATIVE MICROSCOPIC NOT DONE ON URINES WITH NEGATIVE PROTEIN, BLOOD, LEUKOCYTES, NITRITE, OR GLUCOSE <1000 mg/dL.    ----------------------------------------------------------------------------------------------------------------   Imaging results:   Dg Chest 2 View  Result Date: 12/15/2017 CLINICAL DATA:  Cough for 2 days.  History of COPD, ex-smoker. EXAM: CHEST - 2 VIEW COMPARISON:  Chest x-rays dated 11/30/2015 and 10/11/2015. FINDINGS: Heart size is within normal limits, stable. Overall cardiomediastinal silhouette is stable in size and configuration. Chronic scarring/fibrosis again noted within the right upper lobe, with stable pleuroparenchymal  scarring at the right lung apex. Additional coarse lung markings at the lung bases are stable, presumably interstitial thickening related to chronic interstitial lung disease. No new lung findings. No confluent opacity to suggest a developing pneumonia. No pleural effusion or pneumothorax seen. No acute or suspicious osseous finding. IMPRESSION: 1. No active cardiopulmonary disease. No evidence of pneumonia or pulmonary edema. 2. Stable chronic scarring/fibrosis within the right upper lobe. Probable chronic interstitial lung disease at the lung bases, stable. Electronically Signed   By: Franki Cabot M.D.   On: 12/15/2017 08:33      Assessment & Plan  1.  Heme positive stools , patient had epistaxis today      Status post colonoscopy 2 months ago, may need endoscopy 2.  History of coronary artery disease status post stent placement on Plavix 3.  Cough with history of COPD,, continue with Tessalon   Plan  Admit the patient to telemetry Follow hemoglobin IV fluids Consult GI in a.m., patient follows with Maryanna Shape , contacted by emergency room physician Follow chest x-ray ordered now   DVT Prophylaxis SCDs  AM Labs Ordered, also please review Full Orders    Code Status full  Disposition Plan: Home  Time spent in minutes : 38 minutes  Condition fair   @SIGNATURE @

## 2017-12-29 NOTE — ED Notes (Signed)
ED TO INPATIENT HANDOFF REPORT  Name/Age/Gender Donald Leblanc 62 y.o. male  Code Status   Home/SNF/Other Home  Chief Complaint Blood in stool; coughing up blood  Level of Care/Admitting Diagnosis ED Disposition    ED Disposition Condition Comment   Admit  Hospital Area: [place] [[id number]]  Level of Care: Telemetry [5]  Admit to tele based on following criteria: Other see comments  Comments: GI bleed  Diagnosis: GI bleed [[id number]]  Admitting Physician: Merton Border [[id number]]  Attending Physician: Laren Everts, [place]  Estimated length of stay: past midnight tomorrow  Certification:: I certify this patient will need inpatient services for at least 2 midnights  PT Class (Do Not Modify): Inpatient [101]  PT Acc Code (Do Not Modify): Private [1]       Medical History Past Medical History:  Diagnosis Date  . Back pain   . Colon polyps   . COPD (chronic obstructive pulmonary disease) ([place])   . Coronary artery disease   . Hx of substance abuse   . PVD (peripheral vascular disease) ([place])   . Seizures ([place])   . Thrombocytosis ([place])     Allergies No Known Allergies  IV Location/Drains/Wounds Patient Lines/Drains/Airways Status   Active Line/Drains/Airways    Name:   Placement date:   Placement time:   Site:   Days:   Peripheral IV 12/29/17 Right Antecubital   12/29/17    2226    Antecubital   less than 1          Labs/Imaging Results for orders placed or performed during the hospital encounter of 12/29/17 (from the past 48 hour(s))  Comprehensive metabolic panel     Status: Abnormal   Collection Time: 12/29/17  7:00 PM  Result Value Ref Range   Sodium 138 135 - 145 mmol/L   Potassium 4.0 3.5 - 5.1 mmol/L   Chloride 107 101 - 111 mmol/L   CO2 22 22 - 32 mmol/L   Glucose, Bld 107 (H) 65 - 99 mg/dL   BUN 27 (H) 6 - 20 mg/dL   Creatinine, Ser 1.32 (H) 0.61 - 1.24 mg/dL   Calcium 9.2 8.9 - 10.3 mg/dL   Total Protein 7.8 6.5 - 8.1 g/dL   Albumin 3.4 (L) 3.5 - 5.0 g/dL   AST 20 15 - 41 U/L   ALT 29 17 - 63 U/L   Alkaline Phosphatase 93 38 - 126 U/L   Total Bilirubin 0.7 0.3 - 1.2 mg/dL   GFR calc non Af Amer 57 (L) >60 mL/min   GFR calc Af Amer >60 >60 mL/min    Comment: (NOTE) The eGFR has been calculated using the CKD EPI equation. This calculation has not been validated in all clinical situations. eGFR's persistently <60 mL/min signify possible Chronic Kidney Disease.    Anion gap 9 5 - 15    Comment: Performed at [facility], [place] [address], [place], [place] [address]  CBC     Status: Abnormal   Collection Time: 12/29/17  7:00 PM  Result Value Ref Range   WBC 13.3 (H) 4.0 - 10.5 K/uL   RBC 4.46 4.22 - 5.81 MIL/uL   Hemoglobin 11.2 (L) 13.0 - 17.0 g/dL   HCT 36.4 (L) 39.0 - 52.0 %   MCV 81.6 78.0 - 100.0 fL   MCH 25.1 (L) 26.0 - 34.0 pg   MCHC 30.8 30.0 - 36.0 g/dL   RDW 15.3 11.5 - 15.5 %   Platelets 478 (H) 150 -  400 K/uL    Comment: Performed at [facility], [place] [address] [place], [place] [address]  Protime-INR - (order if Patient is taking Coumadin / Warfarin)     Status: None   Collection Time: 12/29/17  7:00 PM  Result Value Ref Range   Prothrombin Time 13.5 11.4 - 15.2 seconds   INR 1.04     Comment: Performed at [facility], [place] [address], [place], [place] [address]  Type and screen [place]     Status: None   Collection Time: 12/29/17  7:03 PM  Result Value Ref Range   ABO/RH(D) O NEG    Antibody Screen NEG    Sample Expiration      01/01/2018 Performed at [facility], [place] [address], [place], [place] [address]   ABO/Rh     Status: None   Collection Time: 12/29/17  7:03 PM  Result Value Ref Range   ABO/RH(D)      Jenetta Downer NEG Performed at [place] [address], [place], [place] [address]   POC occult blood, ED     Status: Abnormal   Collection Time: 12/29/17  7:07 PM   Result Value Ref Range   Fecal Occult Bld POSITIVE (A) NEGATIVE   No results found.  Pending Labs [employer] (From admission, onward)   Start     Ordered   Signed and Held  HIV antibody (Routine Testing)  Once,   R     Signed and Held   Signed and Held  Hemoglobin  Now then every 8 hours,   R     Signed and Held   Visual merchandiser and Held  Basic metabolic panel  Tomorrow morning,   R     Signed and Held   Signed and Held  CBC  Tomorrow morning,   R     Signed and Held      Vitals/Pain Today's Vitals   12/29/17 1915 12/29/17 2000 12/29/17 2130 12/29/17 2215  BP: 122/76 121/80 121/76 130/77  Pulse: (!) 103 (!) 105 65 (!) 102  Resp: 17 20 (!) 24 17  Temp:      TempSrc:      SpO2: 98% 98% 95% 96%  Weight:      Height:      PainSc:        Isolation Precautions No active isolations  Medications Medications - No data to display  Mobility walks

## 2017-12-29 NOTE — ED Triage Notes (Addendum)
Patient reports he is experiencing rectal bleeding. Patient is on Plavix. Patient was seen earlier this morning at [facility] ED for a right nare bleed. Patient denies feeling lightheaded or dizzy. However, reports he feels fatigued.

## 2017-12-30 ENCOUNTER — Encounter (HOSPITAL_COMMUNITY): Payer: Self-pay | Admitting: *Deleted

## 2017-12-30 ENCOUNTER — Inpatient Hospital Stay (HOSPITAL_COMMUNITY): Payer: Medicaid Other

## 2017-12-30 ENCOUNTER — Encounter (HOSPITAL_COMMUNITY)
Admission: EM | Disposition: A | Discharge status: Home / Self Care | Payer: Self-pay | Source: Home / Self Care | Attending: Family Medicine

## 2017-12-30 ENCOUNTER — Inpatient Hospital Stay (HOSPITAL_COMMUNITY): Payer: Medicaid Other | Admitting: Anesthesiology

## 2017-12-30 DIAGNOSIS — K317 Polyp of stomach and duodenum: Secondary | ICD-10-CM

## 2017-12-30 DIAGNOSIS — K922 Gastrointestinal hemorrhage, unspecified: Secondary | ICD-10-CM

## 2017-12-30 HISTORY — PX: ESOPHAGOGASTRODUODENOSCOPY (EGD) WITH PROPOFOL: SHX5813

## 2017-12-30 LAB — CBC
HCT: 33.2 % — ABNORMAL LOW (ref 39.0–52.0)
HEMOGLOBIN: 10.3 g/dL — AB (ref 13.0–17.0)
MCH: 25.2 pg — AB (ref 26.0–34.0)
MCHC: 31 g/dL (ref 30.0–36.0)
MCV: 81.4 fL (ref 78.0–100.0)
Platelets: 443 10*3/uL — ABNORMAL HIGH (ref 150–400)
RBC: 4.08 MIL/uL — ABNORMAL LOW (ref 4.22–5.81)
RDW: 15.4 % (ref 11.5–15.5)
WBC: 10 10*3/uL (ref 4.0–10.5)

## 2017-12-30 LAB — BASIC METABOLIC PANEL
ANION GAP: 9 (ref 5–15)
BUN: 19 mg/dL (ref 6–20)
CALCIUM: 8.9 mg/dL (ref 8.9–10.3)
CO2: 21 mmol/L — AB (ref 22–32)
CREATININE: 1.02 mg/dL (ref 0.61–1.24)
Chloride: 108 mmol/L (ref 101–111)
GFR calc Af Amer: >60 mL/min (ref >60)
GFR calc non Af Amer: >60 mL/min (ref >60)
Glucose, Bld: 93 mg/dL (ref 65–99)
Potassium: 3.9 mmol/L (ref 3.5–5.1)
Sodium: 138 mmol/L (ref 135–145)

## 2017-12-30 LAB — HIV ANTIBODY (ROUTINE TESTING W REFLEX): HIV Screen 4th Generation wRfx: NONREACTIVE

## 2017-12-30 LAB — HEMOGLOBIN: HEMOGLOBIN: 11 g/dL — AB (ref 13.0–17.0)

## 2017-12-30 SURGERY — ESOPHAGOGASTRODUODENOSCOPY (EGD) WITH PROPOFOL
Anesthesia: Monitor Anesthesia Care

## 2017-12-30 MED ORDER — ALBUTEROL SULFATE (2.5 MG/3ML) 0.083% IN NEBU: 2.5000 mg | INHALATION_SOLUTION | Freq: Four times a day (QID) | RESPIRATORY_TRACT | Status: DC | PRN

## 2017-12-30 MED ORDER — ASPIRIN EC 81 MG PO TBEC
81.0000 mg | DELAYED_RELEASE_TABLET | Freq: Every day | ORAL | Status: DC
  Administered 2017-12-30 – 2018-01-01 (×3): 81 mg via ORAL
  Filled 2017-12-30 (×3): qty 1

## 2017-12-30 MED ORDER — PROPOFOL 10 MG/ML IV BOLUS
INTRAVENOUS | Status: AC
  Filled 2017-12-30: qty 40

## 2017-12-30 MED ORDER — PANTOPRAZOLE SODIUM 40 MG PO TBEC
40.0000 mg | DELAYED_RELEASE_TABLET | Freq: Every day | ORAL | Status: DC
  Administered 2017-12-30 – 2018-01-01 (×3): 40 mg via ORAL
  Filled 2017-12-30 (×3): qty 1

## 2017-12-30 MED ORDER — ATORVASTATIN CALCIUM 40 MG PO TABS
40.0000 mg | ORAL_TABLET | Freq: Every day | ORAL | Status: DC
  Administered 2017-12-30 – 2017-12-31 (×2): 40 mg via ORAL
  Filled 2017-12-30 (×2): qty 1

## 2017-12-30 MED ORDER — GUAIFENESIN-DM 100-10 MG/5ML PO SYRP
5.0000 mL | ORAL_SOLUTION | ORAL | Status: DC | PRN
  Administered 2017-12-30: 5 mL via ORAL
  Filled 2017-12-30: qty 10

## 2017-12-30 MED ORDER — PROPOFOL 10 MG/ML IV BOLUS
INTRAVENOUS | Status: DC | PRN
  Administered 2017-12-30 (×4): 50 mg via INTRAVENOUS

## 2017-12-30 MED ORDER — ORAL CARE MOUTH RINSE: 15.0000 mL | Freq: Two times a day (BID) | OROMUCOSAL | Status: DC

## 2017-12-30 MED ORDER — LIDOCAINE 2% (20 MG/ML) 5 ML SYRINGE
INTRAMUSCULAR | Status: DC | PRN
  Administered 2017-12-30: 60 mg via INTRAVENOUS

## 2017-12-30 MED ORDER — CHLORHEXIDINE GLUCONATE 0.12 % MT SOLN
15.0000 mL | Freq: Two times a day (BID) | OROMUCOSAL | Status: DC
  Administered 2017-12-30 – 2018-01-01 (×3): 15 mL via OROMUCOSAL
  Filled 2017-12-30 (×5): qty 15

## 2017-12-30 SURGICAL SUPPLY — 15 items

## 2017-12-30 NOTE — Consult Note (Addendum)
Consultation  Referring Provider:[facility] hospitalist/ [place] Primary Care Physician:  Windy Fast, MD Primary Gastroenterologist:  Dr.Perry  Reason for Consultation:  Melena, diarrhea x 2 weeks  HPI: Donald Leblanc is a 62 y.o. male known to Dr. Henrene Pastor from recent colonoscopy, who was admitted through the emergency room last evening after he presented with complaints of black stools. Actually the patient had been in the ER earlier in the day secondary to a nosebleed which he and his wife say was severe.  He was brought in by EMS.  Bleeding was stopped with nasal spray and he was sent back home.  They came back last evening because of increased frequency of black stool..  It was unclear whether patient was having black stool from epistaxis or additional GI bleed.  Because he is maintained on chronic aspirin and Plavix he was admitted. He was hemodynamically stable in the emergency room and presenting hemoglobin was 11.2 hematocrit of 36.4.  This a.m. hemoglobin down to 10.3, hematocrit of 33.2 .  Patient has not had any nausea vomiting or hematemesis. He has had some mild abdominal cramping over the past couple of weeks.  On further questioning patient says he has been having dark black tarry appearing stools over the past 2 weeks.  He is also developed diarrhea with postprandial urgency and mucus in the stool.  He is having at least 5-6 bowel movements daily all diarrheal.  He says now every time he coughs or urinates he also passes some incontinent black mucoid stool. No fever or chills.  Appetite has been okay no nausea or vomiting as above. He has not been on any additional NSAIDs.  No prior history of upper GI issues. No prior EGD. Patient did have an upper respiratory infection a few weeks ago and took a course of either Augmentin or amoxicillin and says the diarrhea started after that. Last dose of Plavix and aspirin was yesterday   Past Medical History:  Diagnosis Date  . Back pain    . Colon polyps   . COPD (chronic obstructive pulmonary disease) ([place])   . Coronary artery disease   . Hx of substance abuse   . PVD (peripheral vascular disease) ([place])   . Seizures ([place])   . Thrombocytosis ([place])     Past Surgical History:  Procedure Laterality Date  . CORONARY ANGIOPLASTY WITH STENT PLACEMENT    . HAND SURGERY      Prior to Admission medications   Medication Sig Start Date End Date Taking? Authorizing Provider  albuterol (PROVENTIL HFA;VENTOLIN HFA) 108 (90 Base) MCG/ACT inhaler Inhale 1-2 puffs into the lungs every 6 (six) hours as needed for wheezing or shortness of breath. 12/15/17  Yes Pisciotta, Elmyra Ricks, PA-C  amLODipine (NORVASC) 10 MG tablet Take 10 mg by mouth daily.   Yes [provider]  amoxicillin-clavulanate (AUGMENTIN) 875-125 MG tablet Take 1 tablet by mouth 2 (two) times daily for 10 days. 12/23/17 01/02/18 Yes Tanda Rockers, MD  aspirin EC 81 MG tablet Take 81 mg by mouth daily.   Yes [provider]  benzonatate (TESSALON) 100 MG capsule Take 1 capsule (100 mg total) by mouth every 8 (eight) hours. Patient taking differently: Take 100 mg by mouth 3 (three) times daily as needed for cough.  11/30/15  Yes Kirichenko, Tatyana, PA-C  budesonide-formoterol (SYMBICORT) 80-4.5 MCG/ACT inhaler Inhale 2 puffs into the lungs 2 (two) times daily. 12/23/17  Yes Tanda Rockers, MD  clopidogrel (PLAVIX) 75 MG tablet Take 75  mg by mouth daily.   Yes [provider]  HYDROcodone-acetaminophen (NORCO/VICODIN) 5-325 MG tablet Take 1-2 tablets by mouth every 6 hours as needed for cough. 12/15/17  Yes Pisciotta, Elmyra Ricks, PA-C  isosorbide mononitrate (IMDUR) 60 MG 24 hr tablet Take 60 mg by mouth daily.   Yes [provider]  metoprolol (LOPRESSOR) 50 MG tablet Take 50 mg by mouth 2 (two) times daily.   Yes [provider]    Current Facility-Administered Medications  Medication Dose Route Frequency Provider Last Rate Last Dose  .  0.9 %  sodium chloride infusion   Intravenous Continuous Merton Border, MD 50 mL/hr at 12/30/17 0008    . acetaminophen (TYLENOL) tablet 650 mg  650 mg Oral Q6H PRN Merton Border, MD       Or  . acetaminophen (TYLENOL) suppository 650 mg  650 mg Rectal Q6H PRN Merton Border, MD      . albuterol (PROVENTIL) (2.5 MG/3ML) 0.083% nebulizer solution 2.5 mg  2.5 mg Nebulization Q6H PRN Merton Border, MD      . amLODipine (NORVASC) tablet 10 mg  10 mg Oral Daily Merton Border, MD   10 mg at 12/30/17 0935  . benzonatate (TESSALON) capsule 100 mg  100 mg Oral Q8H Merton Border, MD   100 mg at 12/30/17 0934  . chlorhexidine (PERIDEX) 0.12 % solution 15 mL  15 mL Mouth Rinse BID Merton Border, MD   15 mL at 12/30/17 0935  . guaiFENesin-dextromethorphan (ROBITUSSIN DM) 100-10 MG/5ML syrup 5 mL  5 mL Oral Q4H PRN Merton Border, MD   5 mL at 12/30/17 0507  . HYDROcodone-acetaminophen (NORCO/VICODIN) 5-325 MG per tablet 1-2 tablet  1-2 tablet Oral Q4H PRN Merton Border, MD      . isosorbide mononitrate (IMDUR) 24 hr tablet 60 mg  60 mg Oral Daily Merton Border, MD   60 mg at 12/30/17 0935  . MEDLINE mouth rinse  15 mL Mouth Rinse q12n4p Merton Border, MD      . metoprolol tartrate (LOPRESSOR) tablet 50 mg  50 mg Oral BID Merton Border, MD   50 mg at 12/30/17 0935  . mometasone-formoterol (DULERA) 100-5 MCG/ACT inhaler 2 puff  2 puff Inhalation BID Merton Border, MD   2 puff at 12/30/17 0856  . ondansetron (ZOFRAN) tablet 4 mg  4 mg Oral Q6H PRN Merton Border, MD       Or  . ondansetron (ZOFRAN) injection 4 mg  4 mg Intravenous Q6H PRN Merton Border, MD      . pantoprazole (PROTONIX) injection 40 mg  40 mg Intravenous Q12H Merton Border, MD   40 mg at 12/30/17 0935  . zolpidem (AMBIEN) tablet 5 mg  5 mg Oral QHS PRN,MR X 1 Merton Border, MD        Allergies as of 12/29/2017  . (No Known Allergies)    Family History  Problem Relation Age of Onset  . Cancer Mother        Unknown  . Colon cancer Neg Hx   . Pancreatic cancer Neg Hx      Social History   Socioeconomic History  . Marital status: Married    Spouse name: Not on file  . Number of children: Not on file  . Years of education: Not on file  . Highest education level: Not on file  Occupational History  . Not on file  Social Needs  . Financial resource strain: Not on file  . Food insecurity:    Worry:  Not on file    Inability: Not on file  . Transportation needs:    Medical: Not on file    Non-medical: Not on file  Tobacco Use  . Smoking status: Former Smoker    Packs/day: 1.50    Years: 20.00    Pack years: 30.00    Types: Cigarettes    Last attempt to quit: 12/23/1997    Years since quitting: 20.0  . Smokeless tobacco: Never Used  Substance and Sexual Activity  . Alcohol use: No  . Drug use: No  . Sexual activity: Not on file  Lifestyle  . Physical activity:    Days per week: Not on file    Minutes per session: Not on file  . Stress: Not on file  Relationships  . Social connections:    Talks on phone: Not on file    Gets together: Not on file    Attends religious service: Not on file    Active member of club or organization: Not on file    Attends meetings of clubs or organizations: Not on file    Relationship status: Not on file  . Intimate partner violence:    Fear of current or ex partner: Not on file    Emotionally abused: Not on file    Physically abused: Not on file    Forced sexual activity: Not on file  Other Topics Concern  . Not on file  Social History Narrative  . Not on file    Review of Systems: Pertinent positive and negative review of systems were noted in the above HPI section.  All other review of systems was otherwise negative.  Physical Exam: Vital signs in last 24 hours: Temp:  [98.2 F (36.8 C)-98.5 F (36.9 C)] 98.3 F (36.8 C) (04/11 0547) Pulse Rate:  [65-108] 86 (04/11 0547) Resp:  [16-24] 16 (04/11 0547) BP: (106-138)/(69-86) 116/77 (04/11 0547) SpO2:  [92 %-100 %] 92 % (04/11 0547) Weight:   [175 lb 8 oz (79.6 kg)-183 lb (83 kg)] 175 lb 8 oz (79.6 kg) (04/10 2333) Last BM Date: 12/30/17 General:   Alert,  Well-developed, well-nourished, African-American male pleasant and cooperative in NAD-wife at bedside Head:  Normocephalic and atraumatic. Scar on scalp Eyes:  Sclera clear, no icterus.   Conjunctiva pink. Ears:  Normal auditory acuity. Nose:  No deformity, discharge,  or lesions. Mouth:  No deformity or lesions.   Neck:  Supple; no masses or thyromegaly. Lungs:  Clear throughout to auscultation.   No wheezes, crackles, or rhonchi. Heart:  Regular rate and rhythm; no murmurs, clicks, rubs,  or gallops. Abdomen:  Soft,nontender, BS active,nonpalp mass or hsm.   Rectal:  Deferred  Msk:  Symmetrical without gross deformities. . Pulses:  Normal pulses noted. Extremities:  Without clubbing or edema. Neurologic:  Alert and  oriented x4;  grossly normal neurologically. Skin:  Intact without significant lesions or rashes.. Psych:  Alert and cooperative. Normal mood and affect.  Intake/Output from previous day: 04/10 0701 - 04/11 0700 In: 293.3 [I.V.:293.3] Out: -  Intake/Output this shift: No intake/output data recorded.  Lab Results: Recent Labs    12/29/17 1900 12/30/17 0007 12/30/17 0508  WBC 13.3*  --  10.0  HGB 11.2* 11.0* 10.3*  HCT 36.4*  --  33.2*  PLT 478*  --  443*   BMET Recent Labs    12/29/17 1900 12/30/17 0508  NA 138 138  K 4.0 3.9  CL 107 108  CO2 22 21*  GLUCOSE  107* 93  BUN 27* 19  CREATININE 1.32* 1.02  CALCIUM 9.2 8.9   LFT Recent Labs    12/29/17 1900  PROT 7.8  ALBUMIN 3.4*  AST 20  ALT 29  ALKPHOS 93  BILITOT 0.7   PT/INR Recent Labs    12/29/17 1900  LABPROT 13.5  INR 1.04    IMPRESSION:  #35 62 year old African-American male admitted last evening with melena in setting of chronic aspirin and Plavix.  Initially unclear whether melena was secondary to swallowed blood from significant nosebleed earlier in the day or  separate issue with GI bleeding. He has been hemodynamically stable but has had drop in hemoglobin. On further discussion with patient he is actually been having black diarrhea over the past 2 weeks with 4-5 bowel movements at least daily of mucoid appearing black stool and significant urgency for bowel movement.  This started after he had taken a course of antibiotics for an upper respiratory infection about 3 weeks ago.  I am concerned he may have C. difficile colitis Will rule out upper GI bleed -though feel less likely  #2 mild anemia normocytic secondary to above #3 recent colonoscopy February 2018-1 very small polyp removed/tubular adenoma, noted internal hemorrhoids no diverticulosis  #4 coronary artery disease status post stent #5.  Peripheral vascular disease status post stents #6 chronic pain syndrome-narcotic dependent #7 seizure disorder #8 COPD  PLAN: #1 keep n.p.o. this a.m. #2 have scheduled for diagnostic only upper endoscopy with Dr. Lyndel Safe this afternoon.  Procedure was discussed in detail with the patient and his wife including indications risks and benefits. #3 serial hemoglobins and transfuse as indicated #4 hold aspirin and Plavix today #5 have ordered stool for C. Difficile #6 continue IV PPI for now, will discontinue if EGD negative  Thank you we will follow with you    Amy Esterwood  12/30/2017, 10:39 AM   Attending physician's note   I have taken an interval history, reviewed the chart and examined the patient. I agree with the Advanced Practitioner's note, impression and recommendations.   62 year old with melena, on chronic aspirin and Plavix History of nosebleed. History of diarrhea, has been on antibiotics. Plan EGD today, rule out C. Difficile. Previous colonoscopy February 2018 negative except for small tubular adenoma. Trend CBC  Carmell Austria, MD

## 2017-12-30 NOTE — Progress Notes (Signed)
Patient Demographics:    Donald Leblanc, is a 62 y.o. male, DOB - [birth year], HOZ:[id number]  Admit date - 12/29/2017   Admitting Physician Merton Border, MD  Outpatient Primary MD for the patient is Windy Fast, MD  LOS - 1   Chief Complaint  Patient presents with  . Rectal Bleeding        Subjective:    Donald Leblanc today has no fevers, no emesis,  No chest pain, hungry and wants to eat, wife at bedside, no emesis, no abdominal pain, having dark stools  Assessment  & Plan :    Active Problems:   Upper GI bleeding  Brief summary 62 y.o. male, with past medical history significant for coronary artery disease status post stent placement on Plavix, peripheral vascular disease and COPD presenting for evaluation of dark stools that was noted 2 days ago.  Hemoccult positive    1) Hemoccult positive stools- ????  Secondary to epistaxis versus frank GI bleed GI consult requested for EGD patient had a colonoscopy a couple weeks ago without significant abnormal findings.  Hemoglobin is down to 10.3 monitor closely and transfuse as needed give PPI  2)H/o CAD/PVD-last stents in the legs and had several years ago however patient had angioplasty without stent placement in the lower extremities just over a year ago, will restart aspirin and Plavix if H&H is stable especially if EGD fails to demonstrate any significant abnormalities, give Lipitor, isosorbide and metoprolol   Code Status : full   Disposition Plan  : home   Consults  :  Gi   DVT Prophylaxis  : Heparin -    Lab Results  Component Value Date   PLT 443 (H) 12/30/2017    Inpatient Medications  Scheduled Meds: . amLODipine  10 mg Oral Daily  . benzonatate  100 mg Oral Q8H  . chlorhexidine  15 mL Mouth Rinse BID  . isosorbide mononitrate  60 mg Oral Daily  . mouth rinse  15 mL Mouth Rinse q12n4p  . metoprolol tartrate  50 mg Oral  BID  . mometasone-formoterol  2 puff Inhalation BID  . pantoprazole (PROTONIX) IV  40 mg Intravenous Q12H   Continuous Infusions: . sodium chloride 50 mL/hr at 12/30/17 0008   PRN Meds:.acetaminophen **OR** acetaminophen, albuterol, guaiFENesin-dextromethorphan, HYDROcodone-acetaminophen, ondansetron **OR** ondansetron (ZOFRAN) IV, zolpidem    Anti-infectives (From admission, onward)   None        Objective:   Vitals:   12/30/17 1318 12/30/17 1320 12/30/17 1325 12/30/17 1412  BP: 103/63 103/63 106/84 121/76  Pulse: 87 88 83 86  Resp: 11 17 (!) 25 (!) 22  Temp: 98 F (36.7 C)   98.2 F (36.8 C)  TempSrc: Oral   Oral  SpO2: 99% 99% 95% 100%  Weight:      Height:        Wt Readings from Last 3 Encounters:  12/30/17 79.4 kg (175 lb)  12/23/17 83.4 kg (183 lb 12.8 oz)  11/08/17 85.3 kg (188 lb)     Intake/Output Summary (Last 24 hours) at 12/30/2017 1442 Last data filed at 12/30/2017 1327 Gross per 24 hour  Intake 713.33 ml  Output 0 ml  Net 713.33 ml     Physical Exam   Gen:-  Awake Alert,  In no apparent distress  HEENT:- [place].AT, No sclera icterus Neck-Supple Neck,No JVD,.  Lungs-  CTAB good air movement CV- S1, S2 normal Abd-  +ve B.Sounds, Abd Soft, No tenderness,    Extremity/Skin:- No  edema,   good pulses Psych-affect is appropriate, oriented x3 Neuro-no new focal deficits, no tremors   Data Review:   Micro Results No results found for this or any previous visit (from the past 240 hour(s)).  Radiology Reports Dg Chest 1 View  Result Date: 12/30/2017 CLINICAL DATA:  Cough for 3 weeks. EXAM: CHEST  1 VIEW COMPARISON:  Radiograph 12/15/2017, 11/30/2015 FINDINGS: Right lung scarring is unchanged from prior exam. No consolidation. Unchanged heart size and mediastinal contours. No pulmonary edema, pleural effusion or pneumothorax. No acute osseous abnormalities are seen. IMPRESSION: 1. No acute abnormality. 2. Right lung scarring, unchanged. Electronically  Signed   By: Jeb Levering M.D.   On: 12/30/2017 00:54   Dg Chest 2 View  Result Date: 12/15/2017 CLINICAL DATA:  Cough for 2 days.  History of COPD, ex-smoker. EXAM: CHEST - 2 VIEW COMPARISON:  Chest x-rays dated 11/30/2015 and 10/11/2015. FINDINGS: Heart size is within normal limits, stable. Overall cardiomediastinal silhouette is stable in size and configuration. Chronic scarring/fibrosis again noted within the right upper lobe, with stable pleuroparenchymal scarring at the right lung apex. Additional coarse lung markings at the lung bases are stable, presumably interstitial thickening related to chronic interstitial lung disease. No new lung findings. No confluent opacity to suggest a developing pneumonia. No pleural effusion or pneumothorax seen. No acute or suspicious osseous finding. IMPRESSION: 1. No active cardiopulmonary disease. No evidence of pneumonia or pulmonary edema. 2. Stable chronic scarring/fibrosis within the right upper lobe. Probable chronic interstitial lung disease at the lung bases, stable. Electronically Signed   By: Franki Cabot M.D.   On: 12/15/2017 08:33     CBC Recent Labs  Lab 12/29/17 1900 12/30/17 0007 12/30/17 0508  WBC 13.3*  --  10.0  HGB 11.2* 11.0* 10.3*  HCT 36.4*  --  33.2*  PLT 478*  --  443*  MCV 81.6  --  81.4  MCH 25.1*  --  25.2*  MCHC 30.8  --  31.0  RDW 15.3  --  15.4    Chemistries  Recent Labs  Lab 12/29/17 1900 12/30/17 0508  NA 138 138  K 4.0 3.9  CL 107 108  CO2 22 21*  GLUCOSE 107* 93  BUN 27* 19  CREATININE 1.32* 1.02  CALCIUM 9.2 8.9  AST 20  --   ALT 29  --   ALKPHOS 93  --   BILITOT 0.7  --    ------------------------------------------------------------------------------------------------------------------ No results for input(s): CHOL, HDL, LDLCALC, TRIG, CHOLHDL, LDLDIRECT in the last 72 hours.  No results found for:  HGBA1C ------------------------------------------------------------------------------------------------------------------ No results for input(s): TSH, T4TOTAL, T3FREE, THYROIDAB in the last 72 hours.  Invalid input(s): FREET3 ------------------------------------------------------------------------------------------------------------------ No results for input(s): VITAMINB12, FOLATE, FERRITIN, TIBC, IRON, RETICCTPCT in the last 72 hours.  Coagulation profile Recent Labs  Lab 12/29/17 1900  INR 1.04    No results for input(s): DDIMER in the last 72 hours.  Cardiac Enzymes No results for input(s): CKMB, TROPONINI, MYOGLOBIN in the last 168 hours.  Invalid input(s): CK ------------------------------------------------------------------------------------------------------------------ No results found for: BNP   Roxan Hockey M.D on 12/30/2017 at 2:42 PM  Between 7am to 7pm - Pager - [phone]  After 7pm go to [email or web address] - password [email or web address]  [facility] -  Office  [phone]   Voice Recognition Viviann Spare dictation system was used to create this note, attempts have been made to correct errors. Please contact the author with questions and/or clarifications.

## 2017-12-30 NOTE — Anesthesia Preprocedure Evaluation (Addendum)
Anesthesia Evaluation  Patient identified by MRN, date of birth, ID band Patient awake    Reviewed: Allergy & Precautions, H&P , Patient's Chart, lab work & pertinent test results, reviewed documented beta blocker date and time   Airway Mallampati: II  TM Distance: >3 FB Neck ROM: full    Dental no notable dental hx. (+) Dental Advisory Given, Edentulous Upper, Edentulous Lower   Pulmonary COPD, former smoker,    Pulmonary exam normal breath sounds clear to auscultation       Cardiovascular + CAD   Rhythm:regular Rate:Normal     Neuro/Psych    GI/Hepatic   Endo/Other    Renal/GU      Musculoskeletal   Abdominal   Peds  Hematology   Anesthesia Other Findings   Reproductive/Obstetrics                            Anesthesia Physical Anesthesia Plan  ASA: III  Anesthesia Plan: MAC   Post-op Pain Management:    Induction: Intravenous  PONV Risk Score and Plan: Treatment may vary due to age or medical condition  Airway Management Planned: Mask and Natural Airway  Additional Equipment:   Intra-op Plan:   Post-operative Plan:   Informed Consent: I have reviewed the patients History and Physical, chart, labs and discussed the procedure including the risks, benefits and alternatives for the proposed anesthesia with the patient or authorized representative who has indicated his/her understanding and acceptance.   Dental Advisory Given  Plan Discussed with: CRNA and Surgeon  Anesthesia Plan Comments:         Anesthesia Quick Evaluation

## 2017-12-30 NOTE — Transfer of Care (Signed)
Immediate Anesthesia Transfer of Care Note  Patient: Donald Leblanc  Procedure(s) Performed: ESOPHAGOGASTRODUODENOSCOPY (EGD) WITH PROPOFOL (N/A )  Patient Location: PACU and Endoscopy Unit  Anesthesia Type:MAC  Level of Consciousness: awake and alert   Airway & Oxygen Therapy: Patient Spontanous Breathing and Patient connected to nasal cannula oxygen  Post-op Assessment: Report given to RN and Post -op Vital signs reviewed and stable  Post vital signs: Reviewed and stable  Last Vitals:  Vitals Value Taken Time  BP 106/84 12/30/2017  1:25 PM  Temp 36.7 C 12/30/2017  1:18 PM  Pulse 86 12/30/2017  1:26 PM  Resp 25 12/30/2017  1:26 PM  SpO2 97 % 12/30/2017  1:26 PM  Vitals shown include unvalidated device data.  Last Pain:  Vitals:   12/30/17 1325  TempSrc:   PainSc: 0-No pain         Complications: No apparent anesthesia complications

## 2017-12-30 NOTE — Interval H&P Note (Signed)
History and Physical Interval Note:  12/30/2017 12:50 PM  Donald Leblanc  has presented today for surgery, with the diagnosis of GI bleed  The various methods of treatment have been discussed with the patient and family. After consideration of risks, benefits and other options for treatment, the patient has consented to  Procedure(s): ESOPHAGOGASTRODUODENOSCOPY (EGD) WITH PROPOFOL (N/A) as a surgical intervention .  The patient's history has been reviewed, patient examined, no change in status, stable for surgery.  I have reviewed the patient's chart and labs.  Questions were answered to the patient's satisfaction.     Donald Leblanc

## 2017-12-30 NOTE — Anesthesia Procedure Notes (Signed)
Procedure Name: MAC Date/Time: 12/30/2017 12:56 PM Performed by: Cynda Familia, CRNA Pre-anesthesia Checklist: Patient identified, Emergency Drugs available, Suction available, Patient being monitored and Timeout performed Patient Re-evaluated:Patient Re-evaluated prior to induction Oxygen Delivery Method: Nasal cannula Placement Confirmation: positive ETCO2 and breath sounds checked- equal and bilateral Dental Injury: Teeth and Oropharynx as per pre-operative assessment

## 2017-12-30 NOTE — Op Note (Signed)
[facility] Patient Name: Donald Leblanc Procedure Date: 12/30/2017 MRN: [id number] Attending MD: Jackquline Denmark MD, MD Date of Birth: 05/27/1956 CSN: [id number] Age: 62 Admit Type: Inpatient Procedure:                Upper GI endoscopy Indications:              Recent gastrointestinal bleeding Providers:                Jackquline Denmark MD, MD, Elmer Ramp. Hinson, RN, Janie                            Billups, Technician, Glenis Smoker, CRNA Referring MD:             [facility] Medicines:                Monitored Anesthesia Care Complications:            No immediate complications. Estimated Blood Loss:     Estimated blood loss was minimal. Procedure:                Pre-Anesthesia Assessment:                           - Prior to the procedure, a History and Physical                            was performed, and patient medications and                            allergies were reviewed. The patient is competent.                            The risks and benefits of the procedure and the                            sedation options and risks were discussed with the                            patient. All questions were answered and informed                            consent was obtained. Patient identification and                            proposed procedure were verified by the physician                            in the pre-procedure area in the procedure room.                            Mental Status Examination: alert and oriented.                            Prophylactic Antibiotics: The patient does not  require prophylactic antibiotics. Prior                            Anticoagulants: The patient has taken Plavix                            (clopidogrel), last dose was 1 day prior to                            procedure. ASA Grade Assessment: III - A patient                            with severe systemic disease. After reviewing the                         risks and benefits, the patient was deemed in                            satisfactory condition to undergo the procedure.                            The anesthesia plan was to use monitored anesthesia                            care (MAC). Immediately prior to administration of                            medications, the patient was re-assessed for                            adequacy to receive sedatives. The heart rate,                            respiratory rate, oxygen saturations, blood                            pressure, adequacy of pulmonary ventilation, and                            response to care were monitored throughout the                            procedure. The physical status of the patient was                            re-assessed after the procedure.                           After obtaining informed consent, the endoscope was                            passed under direct vision. Throughout the  procedure, the patient's blood pressure, pulse, and                            oxygen saturations were monitored continuously. The                            EG-[id number] ([id number]) scope was introduced through the                            mouth, and advanced to the second part of duodenum.                            The upper GI endoscopy was accomplished without                            difficulty. The patient tolerated the procedure                            well. Scope In: Scope Out: Findings:      The in the esophagus was normal.      The in the stomach was normal.      A single 4 mm sessile polyp with no bleeding was found in the first       portion of the duodenum. The polyp was removed with a cold biopsy       forceps. Resection and retrieval were complete. Estimated blood loss was       minimal. Impression:               - Small duodenal polyp. Resected and retrieved.                           - Otherwise normal EGD.  No UGI bleeding. Moderate Sedation:      N/A- Per Anesthesia Care Recommendation:           - Return patient to hospital ward for ongoing care.                           - Resume previous diet.                           - Continue present medications.                           - Await pathology results.                           - The findings and recommendations were discussed                            with the patient and pt's wife. Procedure Code(s):        --- Professional ---                           [phone], Esophagogastroduodenoscopy, flexible,  transoral; with biopsy, single or multiple Diagnosis Code(s):        --- Professional ---                           K31.7, Polyp of stomach and duodenum                           K92.2, Gastrointestinal hemorrhage, unspecified CPT copyright 2017 American Medical Association. All rights reserved. The codes documented in this report are preliminary and upon coder review may  be revised to meet current compliance requirements. Jackquline Denmark MD, MD 12/30/2017 1:22:18 PM This report has been signed electronically. Number of Addenda: 0

## 2017-12-30 NOTE — H&P (View-Only) (Signed)
Consultation  Referring Provider:[facility]/ [place] Primary Care Physician:  Windy Fast, MD Primary Gastroenterologist:  Dr.Perry  Reason for Consultation:  Melena, diarrhea x 2 weeks  HPI: Donald Leblanc is a 62 y.o. male known to Dr. Henrene Pastor from recent colonoscopy, who was admitted through the emergency room last evening after he presented with complaints of black stools. Actually the patient had been in the ER earlier in the day secondary to a nosebleed which he and his wife say was severe.  He was brought in by EMS.  Bleeding was stopped with nasal spray and he was sent back home.  They came back last evening because of increased frequency of black stool..  It was unclear whether patient was having black stool from epistaxis or additional GI bleed.  Because he is maintained on chronic aspirin and Plavix he was admitted. He was hemodynamically stable in the emergency room and presenting hemoglobin was 11.2 hematocrit of 36.4.  This a.m. hemoglobin down to 10.3, hematocrit of 33.2 .  Patient has not had any nausea vomiting or hematemesis. He has had some mild abdominal cramping over the past couple of weeks.  On further questioning patient says he has been having dark black tarry appearing stools over the past 2 weeks.  He is also developed diarrhea with postprandial urgency and mucus in the stool.  He is having at least 5-6 bowel movements daily all diarrheal.  He says now every time he coughs or urinates he also passes some incontinent black mucoid stool. No fever or chills.  Appetite has been okay no nausea or vomiting as above. He has not been on any additional NSAIDs.  No prior history of upper GI issues. No prior EGD. Patient did have an upper respiratory infection a few weeks ago and took a course of either Augmentin or amoxicillin and says the diarrhea started after that. Last dose of Plavix and aspirin was yesterday   Past Medical History:  Diagnosis Date  . Back pain    . Colon polyps   . COPD (chronic obstructive pulmonary disease) ([place])   . Coronary artery disease   . Hx of substance abuse   . PVD (peripheral vascular disease) ([place])   . Seizures ([place])   . Thrombocytosis ([place])     Past Surgical History:  Procedure Laterality Date  . CORONARY ANGIOPLASTY WITH STENT PLACEMENT    . HAND SURGERY      Prior to Admission medications   Medication Sig Start Date End Date Taking? Authorizing Provider  albuterol (PROVENTIL HFA;VENTOLIN HFA) 108 (90 Base) MCG/ACT inhaler Inhale 1-2 puffs into the lungs every 6 (six) hours as needed for wheezing or shortness of breath. 12/15/17  Yes Pisciotta, Elmyra Ricks, PA-C  amLODipine (NORVASC) 10 MG tablet Take 10 mg by mouth daily.   Yes [provider]  amoxicillin-clavulanate (AUGMENTIN) 875-125 MG tablet Take 1 tablet by mouth 2 (two) times daily for 10 days. 12/23/17 01/02/18 Yes Tanda Rockers, MD  aspirin EC 81 MG tablet Take 81 mg by mouth daily.   Yes [provider]  benzonatate (TESSALON) 100 MG capsule Take 1 capsule (100 mg total) by mouth every 8 (eight) hours. Patient taking differently: Take 100 mg by mouth 3 (three) times daily as needed for cough.  11/30/15  Yes Kirichenko, Tatyana, PA-C  budesonide-formoterol (SYMBICORT) 80-4.5 MCG/ACT inhaler Inhale 2 puffs into the lungs 2 (two) times daily. 12/23/17  Yes Tanda Rockers, MD  clopidogrel (PLAVIX) 75 MG tablet Take 75  mg by mouth daily.   Yes [provider]  HYDROcodone-acetaminophen (NORCO/VICODIN) 5-325 MG tablet Take 1-2 tablets by mouth every 6 hours as needed for cough. 12/15/17  Yes Pisciotta, Elmyra Ricks, PA-C  isosorbide mononitrate (IMDUR) 60 MG 24 hr tablet Take 60 mg by mouth daily.   Yes [provider]  metoprolol (LOPRESSOR) 50 MG tablet Take 50 mg by mouth 2 (two) times daily.   Yes [provider]    Current Facility-Administered Medications  Medication Dose Route Frequency Provider Last Rate Last Dose  .  0.9 %  sodium chloride infusion   Intravenous Continuous Merton Border, MD 50 mL/hr at 12/30/17 0008    . acetaminophen (TYLENOL) tablet 650 mg  650 mg Oral Q6H PRN Merton Border, MD       Or  . acetaminophen (TYLENOL) suppository 650 mg  650 mg Rectal Q6H PRN Merton Border, MD      . albuterol (PROVENTIL) (2.5 MG/3ML) 0.083% nebulizer solution 2.5 mg  2.5 mg Nebulization Q6H PRN Merton Border, MD      . amLODipine (NORVASC) tablet 10 mg  10 mg Oral Daily Merton Border, MD   10 mg at 12/30/17 0935  . benzonatate (TESSALON) capsule 100 mg  100 mg Oral Q8H Merton Border, MD   100 mg at 12/30/17 0934  . chlorhexidine (PERIDEX) 0.12 % solution 15 mL  15 mL Mouth Rinse BID Merton Border, MD   15 mL at 12/30/17 0935  . guaiFENesin-dextromethorphan (ROBITUSSIN DM) 100-10 MG/5ML syrup 5 mL  5 mL Oral Q4H PRN Merton Border, MD   5 mL at 12/30/17 0507  . HYDROcodone-acetaminophen (NORCO/VICODIN) 5-325 MG per tablet 1-2 tablet  1-2 tablet Oral Q4H PRN Merton Border, MD      . isosorbide mononitrate (IMDUR) 24 hr tablet 60 mg  60 mg Oral Daily Merton Border, MD   60 mg at 12/30/17 0935  . MEDLINE mouth rinse  15 mL Mouth Rinse q12n4p Merton Border, MD      . metoprolol tartrate (LOPRESSOR) tablet 50 mg  50 mg Oral BID Merton Border, MD   50 mg at 12/30/17 0935  . mometasone-formoterol (DULERA) 100-5 MCG/ACT inhaler 2 puff  2 puff Inhalation BID Merton Border, MD   2 puff at 12/30/17 0856  . ondansetron (ZOFRAN) tablet 4 mg  4 mg Oral Q6H PRN Merton Border, MD       Or  . ondansetron (ZOFRAN) injection 4 mg  4 mg Intravenous Q6H PRN Merton Border, MD      . pantoprazole (PROTONIX) injection 40 mg  40 mg Intravenous Q12H Merton Border, MD   40 mg at 12/30/17 0935  . zolpidem (AMBIEN) tablet 5 mg  5 mg Oral QHS PRN,MR X 1 Merton Border, MD        Allergies as of 12/29/2017  . (No Known Allergies)    Family History  Problem Relation Age of Onset  . Cancer Mother        Unknown  . Colon cancer Neg Hx   . Pancreatic cancer Neg Hx      Social History   Socioeconomic History  . Marital status: Married    Spouse name: Not on file  . Number of children: Not on file  . Years of education: Not on file  . Highest education level: Not on file  Occupational History  . Not on file  Social Needs  . Financial resource strain: Not on file  . Food insecurity:    Worry:  Not on file    Inability: Not on file  . Transportation needs:    Medical: Not on file    Non-medical: Not on file  Tobacco Use  . Smoking status: Former Smoker    Packs/day: 1.50    Years: 20.00    Pack years: 30.00    Types: Cigarettes    Last attempt to quit: 12/23/1997    Years since quitting: 20.0  . Smokeless tobacco: Never Used  Substance and Sexual Activity  . Alcohol use: No  . Drug use: No  . Sexual activity: Not on file  Lifestyle  . Physical activity:    Days per week: Not on file    Minutes per session: Not on file  . Stress: Not on file  Relationships  . Social connections:    Talks on phone: Not on file    Gets together: Not on file    Attends religious service: Not on file    Active member of club or organization: Not on file    Attends meetings of clubs or organizations: Not on file    Relationship status: Not on file  . Intimate partner violence:    Fear of current or ex partner: Not on file    Emotionally abused: Not on file    Physically abused: Not on file    Forced sexual activity: Not on file  Other Topics Concern  . Not on file  Social History Narrative  . Not on file    Review of Systems: Pertinent positive and negative review of systems were noted in the above HPI section.  All other review of systems was otherwise negative.  Physical Exam: Vital signs in last 24 hours: Temp:  [98.2 F (36.8 C)-98.5 F (36.9 C)] 98.3 F (36.8 C) (04/11 0547) Pulse Rate:  [65-108] 86 (04/11 0547) Resp:  [16-24] 16 (04/11 0547) BP: (106-138)/(69-86) 116/77 (04/11 0547) SpO2:  [92 %-100 %] 92 % (04/11 0547) Weight:   [175 lb 8 oz (79.6 kg)-183 lb (83 kg)] 175 lb 8 oz (79.6 kg) (04/10 2333) Last BM Date: 12/30/17 General:   Alert,  Well-developed, well-nourished, African-American male pleasant and cooperative in NAD-wife at bedside Head:  Normocephalic and atraumatic. Scar on scalp Eyes:  Sclera clear, no icterus.   Conjunctiva pink. Ears:  Normal auditory acuity. Nose:  No deformity, discharge,  or lesions. Mouth:  No deformity or lesions.   Neck:  Supple; no masses or thyromegaly. Lungs:  Clear throughout to auscultation.   No wheezes, crackles, or rhonchi. Heart:  Regular rate and rhythm; no murmurs, clicks, rubs,  or gallops. Abdomen:  Soft,nontender, BS active,nonpalp mass or hsm.   Rectal:  Deferred  Msk:  Symmetrical without gross deformities. . Pulses:  Normal pulses noted. Extremities:  Without clubbing or edema. Neurologic:  Alert and  oriented x4;  grossly normal neurologically. Skin:  Intact without significant lesions or rashes.. Psych:  Alert and cooperative. Normal mood and affect.  Intake/Output from previous day: 04/10 0701 - 04/11 0700 In: 293.3 [I.V.:293.3] Out: -  Intake/Output this shift: No intake/output data recorded.  Lab Results: Recent Labs    12/29/17 1900 12/30/17 0007 12/30/17 0508  WBC 13.3*  --  10.0  HGB 11.2* 11.0* 10.3*  HCT 36.4*  --  33.2*  PLT 478*  --  443*   BMET Recent Labs    12/29/17 1900 12/30/17 0508  NA 138 138  K 4.0 3.9  CL 107 108  CO2 22 21*  GLUCOSE  107* 93  BUN 27* 19  CREATININE 1.32* 1.02  CALCIUM 9.2 8.9   LFT Recent Labs    12/29/17 1900  PROT 7.8  ALBUMIN 3.4*  AST 20  ALT 29  ALKPHOS 93  BILITOT 0.7   PT/INR Recent Labs    12/29/17 1900  LABPROT 13.5  INR 1.04    IMPRESSION:  #33 62 year old African-American male admitted last evening with melena in setting of chronic aspirin and Plavix.  Initially unclear whether melena was secondary to swallowed blood from significant nosebleed earlier in the day or  separate issue with GI bleeding. He has been hemodynamically stable but has had drop in hemoglobin. On further discussion with patient he is actually been having black diarrhea over the past 2 weeks with 4-5 bowel movements at least daily of mucoid appearing black stool and significant urgency for bowel movement.  This started after he had taken a course of antibiotics for an upper respiratory infection about 3 weeks ago.  I am concerned he may have C. difficile colitis Will rule out upper GI bleed -though feel less likely  #2 mild anemia normocytic secondary to above #3 recent colonoscopy February 2018-1 very small polyp removed/tubular adenoma, noted internal hemorrhoids no diverticulosis  #4 coronary artery disease status post stent #5.  Peripheral vascular disease status post stents #6 chronic pain syndrome-narcotic dependent #7 seizure disorder #8 COPD  PLAN: #1 keep n.p.o. this a.m. #2 have scheduled for diagnostic only upper endoscopy with Dr. Lyndel Safe this afternoon.  Procedure was discussed in detail with the patient and his wife including indications risks and benefits. #3 serial hemoglobins and transfuse as indicated #4 hold aspirin and Plavix today #5 have ordered stool for C. Difficile #6 continue IV PPI for now, will discontinue if EGD negative  Thank you we will follow with you    Amy Esterwood  12/30/2017, 10:39 AM   Attending physician's note   I have taken an interval history, reviewed the chart and examined the patient. I agree with the Advanced Practitioner's note, impression and recommendations.   62 year old with melena, on chronic aspirin and Plavix History of nosebleed. History of diarrhea, has been on antibiotics. Plan EGD today, rule out C. Difficile. Previous colonoscopy February 2018 negative except for small tubular adenoma. Trend CBC  Carmell Austria, MD

## 2017-12-30 NOTE — Anesthesia Postprocedure Evaluation (Signed)
Anesthesia Post Note  Patient: Donald Leblanc  Procedure(s) Performed: ESOPHAGOGASTRODUODENOSCOPY (EGD) WITH PROPOFOL (N/A )     Patient location during evaluation: PACU Anesthesia Type: MAC Level of consciousness: awake and alert Pain management: pain level controlled Vital Signs Assessment: post-procedure vital signs reviewed and stable Respiratory status: spontaneous breathing, nonlabored ventilation, respiratory function stable and patient connected to nasal cannula oxygen Cardiovascular status: stable and blood pressure returned to baseline Postop Assessment: no apparent nausea or vomiting Anesthetic complications: no    Last Vitals:  Vitals:   12/30/17 1325 12/30/17 1412  BP: 106/84 121/76  Pulse: 83 86  Resp: (!) 25 (!) 22  Temp:  36.8 C  SpO2: 95% 100%    Last Pain:  Vitals:   12/30/17 1412  TempSrc: Oral  PainSc:                  Riccardo Dubin

## 2017-12-31 ENCOUNTER — Encounter (HOSPITAL_COMMUNITY): Payer: Self-pay | Admitting: Gastroenterology

## 2017-12-31 DIAGNOSIS — Z7901 Long term (current) use of anticoagulants: Secondary | ICD-10-CM

## 2017-12-31 DIAGNOSIS — Z7982 Long term (current) use of aspirin: Secondary | ICD-10-CM

## 2017-12-31 DIAGNOSIS — R197 Diarrhea, unspecified: Secondary | ICD-10-CM

## 2017-12-31 LAB — CBC
HEMATOCRIT: 31.4 % — AB (ref 39.0–52.0)
Hemoglobin: 9.9 g/dL — ABNORMAL LOW (ref 13.0–17.0)
MCH: 25.6 pg — ABNORMAL LOW (ref 26.0–34.0)
MCHC: 31.5 g/dL (ref 30.0–36.0)
MCV: 81.1 fL (ref 78.0–100.0)
Platelets: 422 10*3/uL — ABNORMAL HIGH (ref 150–400)
RBC: 3.87 MIL/uL — ABNORMAL LOW (ref 4.22–5.81)
RDW: 15.3 % (ref 11.5–15.5)
WBC: 9.1 10*3/uL (ref 4.0–10.5)

## 2017-12-31 LAB — BASIC METABOLIC PANEL WITH GFR
Anion gap: 9 (ref 5–15)
BUN: 11 mg/dL (ref 6–20)
CO2: 20 mmol/L — ABNORMAL LOW (ref 22–32)
Calcium: 8.5 mg/dL — ABNORMAL LOW (ref 8.9–10.3)
Chloride: 107 mmol/L (ref 101–111)
Creatinine, Ser: 0.95 mg/dL (ref 0.61–1.24)
GFR calc Af Amer: >60 mL/min
GFR calc non Af Amer: >60 mL/min
Glucose, Bld: 103 mg/dL — ABNORMAL HIGH (ref 65–99)
Potassium: 3.7 mmol/L (ref 3.5–5.1)
Sodium: 136 mmol/L (ref 135–145)

## 2017-12-31 MED ORDER — CLOPIDOGREL BISULFATE 75 MG PO TABS
75.0000 mg | ORAL_TABLET | Freq: Every day | ORAL | Status: DC
  Administered 2017-12-31 – 2018-01-01 (×2): 75 mg via ORAL
  Filled 2017-12-31 (×2): qty 1

## 2017-12-31 NOTE — Progress Notes (Signed)
Per CCMD patient is had a few skipped beats going into 2nd degree type 1.  Strip saved in chart.  Pt asymptomatic.  MD made aware.  Will continue to monitor closely.

## 2017-12-31 NOTE — Progress Notes (Signed)
Patient Demographics:    Donald Leblanc, is a 62 y.o. male, DOB - 1956-08-28, DTO:[id number]  Admit date - 12/29/2017   Admitting Physician Merton Border, MD  Outpatient Primary MD for the patient is Windy Fast, MD  LOS - 2   Chief Complaint  Patient presents with  . Rectal Bleeding        Subjective:    Donald Leblanc today has no fevers, no emesis,  No chest pain, no BM,  Assessment  & Plan :    Active Problems:   Upper GI bleeding  Brief summary 62 y.o. male, with past medical history significant for coronary artery disease status post stent placement on Plavix, peripheral vascular disease and COPD presenting for evaluation of dark stools that was noted 2 days ago.  Hemoccult positive    1) Hemoccult positive stools- ????  Secondary to epistaxis versus frank GI bleed,  GI consult appreciated, EGD on 12/31/17 without significant findings except for duodenal polyp which was removed, patient had a colonoscopy a couple weeks ago without significant abnormal findings.   transfuse as needed give PPI, follow H&H, GI recommends discharge home possibly on 01/01/2018 if H&H is stable, if patient has a bowel movement GI recommends stool for C. difficile testing  2)H/o CAD/PVD-last stents in the legs and had several years ago however patient had angioplasty without stent placement in the lower extremities just over a year ago, okay to restart aspirin and Plavix , c/n  Lipitor, isosorbide and metoprolol   Code Status : full   Disposition Plan  : home   Consults  :  Gi   DVT Prophylaxis  : Heparin -    Lab Results  Component Value Date   PLT 422 (H) 12/31/2017    Inpatient Medications  Scheduled Meds: . amLODipine  10 mg Oral Daily  . aspirin EC  81 mg Oral Daily  . atorvastatin  40 mg Oral q1800  . benzonatate  100 mg Oral Q8H  . chlorhexidine  15 mL Mouth Rinse BID  . isosorbide  mononitrate  60 mg Oral Daily  . mouth rinse  15 mL Mouth Rinse q12n4p  . metoprolol tartrate  50 mg Oral BID  . mometasone-formoterol  2 puff Inhalation BID  . pantoprazole  40 mg Oral Daily   Continuous Infusions: . sodium chloride 20 mL/hr at 12/30/17 1709   PRN Meds:.acetaminophen **OR** acetaminophen, albuterol, guaiFENesin-dextromethorphan, HYDROcodone-acetaminophen, ondansetron **OR** ondansetron (ZOFRAN) IV, zolpidem    Anti-infectives (From admission, onward)   None        Objective:   Vitals:   12/30/17 2226 12/31/17 0515 12/31/17 1058 12/31/17 1226  BP: 132/75 104/67  132/73  Pulse: 99 89  83  Resp: 20 18  18   Temp: 98.7 F (37.1 C) 98 F (36.7 C)  98 F (36.7 C)  TempSrc: Oral Oral  Oral  SpO2: 95% 97% 96% 97%  Weight:      Height:        Wt Readings from Last 3 Encounters:  12/30/17 79.4 kg (175 lb)  12/23/17 83.4 kg (183 lb 12.8 oz)  11/08/17 85.3 kg (188 lb)     Intake/Output Summary (Last 24 hours) at 12/31/2017 1851 Last data filed at 12/31/2017 1846 Gross per  24 hour  Intake 723.33 ml  Output 1200 ml  Net -476.67 ml     Physical Exam   Gen:- Awake Alert,  In no apparent distress  HEENT:- [place].AT, No sclera icterus Neck-Supple Neck,No JVD,.  Lungs-  CTAB good air movement CV- S1, S2 normal Abd-  +ve B.Sounds, Abd Soft, No tenderness,    Extremity/Skin:- No  edema,   good pulses Psych-affect is appropriate, oriented x3 Neuro-no new focal deficits, no tremors   Data Review:   Micro Results No results found for this or any previous visit (from the past 240 hour(s)).  Radiology Reports Dg Chest 1 View  Result Date: 12/30/2017 CLINICAL DATA:  Cough for 3 weeks. EXAM: CHEST  1 VIEW COMPARISON:  Radiograph 12/15/2017, 11/30/2015 FINDINGS: Right lung scarring is unchanged from prior exam. No consolidation. Unchanged heart size and mediastinal contours. No pulmonary edema, pleural effusion or pneumothorax. No acute osseous abnormalities are  seen. IMPRESSION: 1. No acute abnormality. 2. Right lung scarring, unchanged. Electronically Signed   By: Jeb Levering M.D.   On: 12/30/2017 00:54   Dg Chest 2 View  Result Date: 12/15/2017 CLINICAL DATA:  Cough for 2 days.  History of COPD, ex-smoker. EXAM: CHEST - 2 VIEW COMPARISON:  Chest x-rays dated 11/30/2015 and 10/11/2015. FINDINGS: Heart size is within normal limits, stable. Overall cardiomediastinal silhouette is stable in size and configuration. Chronic scarring/fibrosis again noted within the right upper lobe, with stable pleuroparenchymal scarring at the right lung apex. Additional coarse lung markings at the lung bases are stable, presumably interstitial thickening related to chronic interstitial lung disease. No new lung findings. No confluent opacity to suggest a developing pneumonia. No pleural effusion or pneumothorax seen. No acute or suspicious osseous finding. IMPRESSION: 1. No active cardiopulmonary disease. No evidence of pneumonia or pulmonary edema. 2. Stable chronic scarring/fibrosis within the right upper lobe. Probable chronic interstitial lung disease at the lung bases, stable. Electronically Signed   By: Franki Cabot M.D.   On: 12/15/2017 08:33     CBC Recent Labs  Lab 12/29/17 1900 12/30/17 0007 12/30/17 0508 12/31/17 0531  WBC 13.3*  --  10.0 9.1  HGB 11.2* 11.0* 10.3* 9.9*  HCT 36.4*  --  33.2* 31.4*  PLT 478*  --  443* 422*  MCV 81.6  --  81.4 81.1  MCH 25.1*  --  25.2* 25.6*  MCHC 30.8  --  31.0 31.5  RDW 15.3  --  15.4 15.3    Chemistries  Recent Labs  Lab 12/29/17 1900 12/30/17 0508 12/31/17 0531  NA 138 138 136  K 4.0 3.9 3.7  CL 107 108 107  CO2 22 21* 20*  GLUCOSE 107* 93 103*  BUN 27* 19 11  CREATININE 1.32* 1.02 0.95  CALCIUM 9.2 8.9 8.5*  AST 20  --   --   ALT 29  --   --   ALKPHOS 93  --   --   BILITOT 0.7  --   --     ------------------------------------------------------------------------------------------------------------------ No results for input(s): CHOL, HDL, LDLCALC, TRIG, CHOLHDL, LDLDIRECT in the last 72 hours.  No results found for: HGBA1C ------------------------------------------------------------------------------------------------------------------ No results for input(s): TSH, T4TOTAL, T3FREE, THYROIDAB in the last 72 hours.  Invalid input(s): FREET3 ------------------------------------------------------------------------------------------------------------------ No results for input(s): VITAMINB12, FOLATE, FERRITIN, TIBC, IRON, RETICCTPCT in the last 72 hours.  Coagulation profile Recent Labs  Lab 12/29/17 1900  INR 1.04    No results for input(s): DDIMER in the last 72 hours.  Cardiac  Enzymes No results for input(s): CKMB, TROPONINI, MYOGLOBIN in the last 168 hours.  Invalid input(s): CK ------------------------------------------------------------------------------------------------------------------ No results found for: BNP   Roxan Hockey M.D on 12/31/2017 at 6:51 PM  Between 7am to 7pm - Pager - [phone]  After 7pm go to [email or web address] - password [email or web address]  [facility] -  Office  [phone]   Voice Recognition Viviann Spare dictation system was used to create this note, attempts have been made to correct errors. Please contact the author with questions and/or clarifications.

## 2017-12-31 NOTE — Progress Notes (Signed)
Pt has not had a BM since order for c-dif collection placed.

## 2017-12-31 NOTE — Progress Notes (Addendum)
Patient ID: Donald Leblanc, male   DOB: Dec 02, 1955, 62 y.o.   MRN: [id number]      Progress Note   Subjective   Feeling better-eating solid food - no c/o abdominal discomfort- no diarrhea! EGD - small duodenal polyp -removed - otherwise negative  Cdiff-pending Hgb 9.9   Objective   Vital signs in last 24 hours: Temp:  [98 F (36.7 C)-98.7 F (37.1 C)] 98 F (36.7 C) (04/12 0515) Pulse Rate:  [83-99] 89 (04/12 0515) Resp:  [11-25] 18 (04/12 0515) BP: (103-132)/(63-84) 104/67 (04/12 0515) SpO2:  [95 %-100 %] 97 % (04/12 0515) Weight:  [175 lb (79.4 kg)] 175 lb (79.4 kg) (04/11 1201) Last BM Date: 12/30/17 General:  AA male in NAD Heart:  Regular rate and rhythm; no murmurs Lungs: Respirations even and unlabored, lungs CTA bilaterally Abdomen:  Soft, nontender and nondistended. Normal bowel sounds. Extremities:  Without edema. Neurologic:  Alert and oriented,  grossly normal neurologically. Psych:  Cooperative. Normal mood and affect.  Intake/Output from previous day: 04/11 0701 - 04/12 0700 In: 982 [P.O.:120; I.V.:862] Out: 1300 [Urine:1300] Intake/Output this shift: No intake/output data recorded.  Lab Results: Recent Labs    12/29/17 1900 12/30/17 0007 12/30/17 0508 12/31/17 0531  WBC 13.3*  --  10.0 9.1  HGB 11.2* 11.0* 10.3* 9.9*  HCT 36.4*  --  33.2* 31.4*  PLT 478*  --  443* 422*   BMET Recent Labs    12/29/17 1900 12/30/17 0508 12/31/17 0531  NA 138 138 136  K 4.0 3.9 3.7  CL 107 108 107  CO2 22 21* 20*  GLUCOSE 107* 93 103*  BUN 27* 19 11  CREATININE 1.32* 1.02 0.95  CALCIUM 9.2 8.9 8.5*   LFT Recent Labs    12/29/17 1900  PROT 7.8  ALBUMIN 3.4*  AST 20  ALT 29  ALKPHOS 93  BILITOT 0.7   PT/INR Recent Labs    12/29/17 1900  LABPROT 13.5  INR 1.04    Studies/Results: Dg Chest 1 View  Result Date: 12/30/2017 CLINICAL DATA:  Cough for 3 weeks. EXAM: CHEST  1 VIEW COMPARISON:  Radiograph 12/15/2017, 11/30/2015 FINDINGS:  Right lung scarring is unchanged from prior exam. No consolidation. Unchanged heart size and mediastinal contours. No pulmonary edema, pleural effusion or pneumothorax. No acute osseous abnormalities are seen. IMPRESSION: 1. No acute abnormality. 2. Right lung scarring, unchanged. Electronically Signed   By: Jeb Levering M.D.   On: 12/30/2017 00:54       Assessment / Plan:    #65  62 year old male on chronic aspirin and Plavix admitted with black stool and complaints of diarrhea over the past 2 weeks. Patient had actually had a significant nosebleed earlier in the day of admission as well. I hemoglobin has drifted- suspect some of this was secondary to significant epistaxis No evidence of ongoing GI bleeding and EGD yesterday was negative. Patient had had significant diarrhea over the past couple of weeks after taking a course of antibiotics.  Stool for C. difficile is pending.  Interestingly no bowel movements yesterday  #2 history of adenomatous colon polyps, and diverticulosis-up-to-date with colonoscopy last done February 2018 #3 coronary artery disease status post stent #4.  Peripheral vascular disease status post stents #5 chronic pain syndrome/narcotic dependent #6.  COPD #7.  Seizure disorder  Plan; Continue to follow hemoglobin for 1 more day Stool for C. Difficile Stop  IV PPI Think okay to resume aspirin and Plavix.       Contact  Amy Waverly, P.A.-C               [phone]   Attending physician's note   I have taken an interval history, reviewed the chart and examined the patient. I agree with the Advanced Practitioner's note, impression and recommendations.   Pt doing better from the GI standpoint.  No further bleeding.  No further diarrhea.  Follow biopsies.  Trend CBC.  Will sign off for now.  Carmell Austria, MD

## 2018-01-01 LAB — BASIC METABOLIC PANEL
Anion gap: 9 (ref 5–15)
BUN: 9 mg/dL (ref 6–20)
CO2: 23 mmol/L (ref 22–32)
CREATININE: 0.95 mg/dL (ref 0.61–1.24)
Calcium: 8.5 mg/dL — ABNORMAL LOW (ref 8.9–10.3)
Chloride: 107 mmol/L (ref 101–111)
GFR calc Af Amer: >60 mL/min (ref >60)
Glucose, Bld: 106 mg/dL — ABNORMAL HIGH (ref 65–99)
Potassium: 3.7 mmol/L (ref 3.5–5.1)
Sodium: 139 mmol/L (ref 135–145)

## 2018-01-01 LAB — CBC
HCT: 31.6 % — ABNORMAL LOW (ref 39.0–52.0)
Hemoglobin: 9.7 g/dL — ABNORMAL LOW (ref 13.0–17.0)
MCH: 25.4 pg — AB (ref 26.0–34.0)
MCHC: 30.7 g/dL (ref 30.0–36.0)
MCV: 82.7 fL (ref 78.0–100.0)
PLATELETS: 477 10*3/uL — AB (ref 150–400)
RBC: 3.82 MIL/uL — AB (ref 4.22–5.81)
RDW: 15.6 % — ABNORMAL HIGH (ref 11.5–15.5)
WBC: 8.4 10*3/uL (ref 4.0–10.5)

## 2018-01-01 MED ORDER — PANTOPRAZOLE SODIUM 40 MG PO TBEC: 40.0000 mg | DELAYED_RELEASE_TABLET | Freq: Every day | ORAL | 1 refills | Status: DC

## 2018-01-01 MED ORDER — ATORVASTATIN CALCIUM 40 MG PO TABS
40.0000 mg | ORAL_TABLET | Freq: Every day | ORAL | 2 refills | Status: DC
Start: 2018-01-01 — End: 2024-05-13

## 2018-01-01 MED ORDER — GUAIFENESIN ER 600 MG PO TB12: 600.0000 mg | ORAL_TABLET | Freq: Two times a day (BID) | ORAL | 0 refills | Status: AC

## 2018-01-01 NOTE — Discharge Summary (Signed)
Donald Leblanc, is a 62 y.o. male  DOB 1956-07-08  MRN [id number].  Admission date:  12/29/2017  Admitting Physician  Merton Border, MD  Discharge Date:  01/01/2018   Primary MD  Windy Fast, MD  Recommendations for primary care physician for things to follow:   1)Stop using no sprays as this may have contributed to your nosebleeds 2)Restart aspirin and Plavix for your heart and stents in your legs 3)See your doctor in 1-2 weeks for repeat complete blood count test 4)Avoid ibuprofen/Advil/Aleve/Motrin/Goody Powders/Naproxen/BC powders/Meloxicam/Diclofenac/Indomethacin and other Nonsteroidal anti-inflammatory medications as these will make you more likely to bleed and can cause stomach ulcers, can also cause Kidney problems.    Admission Diagnosis  Upper GI bleeding [K92.2]   Discharge Diagnosis  Upper GI bleeding [K92.2]    Active Problems:   Upper GI bleeding      Past Medical History:  Diagnosis Date  . Back pain   . Colon polyps   . COPD (chronic obstructive pulmonary disease) ([place])   . Coronary artery disease   . Hx of substance abuse   . PVD (peripheral vascular disease) ([place])   . Seizures ([place])   . Thrombocytosis ([place])     Past Surgical History:  Procedure Laterality Date  . CORONARY ANGIOPLASTY WITH STENT PLACEMENT    . ESOPHAGOGASTRODUODENOSCOPY (EGD) WITH PROPOFOL N/A 12/30/2017   Procedure: ESOPHAGOGASTRODUODENOSCOPY (EGD) WITH PROPOFOL;  Surgeon: Jackquline Denmark, MD;  Location: [facility] ENDOSCOPY;  Service: Endoscopy;  Laterality: N/A;  . HAND SURGERY         HPI  from the history and physical done on the day of admission:     Donald Leblanc  is a 62 y.o. male, with past medical history significant for coronary artery disease status post stent placement on Plavix, peripheral vascular disease and COPD presenting for evaluation of dark stools that was noted 2 days ago.  The patient  was seen in the emergency room earlier for epistaxis status post oxymetazolin.  The patient was seen by gastroenterology and had a colonoscopy around 2 months ago which was negative.  Patient was admitted for evaluation for GI bleed, hemoglobin is stable      Hospital Course:     Brief summary 62 y.o.male,with past medical history significant for coronary artery disease status post stent placement on Plavix, peripheral vascular disease and COPD presenting for evaluation of dark stools that was noted 2 days ago.  Hemoccult positive   Plan:- 1) Hemoccult positive stools- ????  Secondary to epistaxis versus frank GI bleed,  GI consult appreciated, EGD on 12/31/17 without significant findings except for duodenal polyp which was removed, patient had a colonoscopy a couple weeks ago without significant abnormal findings.  , GI recommends discharge home on 01/01/2018 as H&H is stable,   2)H/o CAD/PVD-last stents in the legs and had several years ago however patient had angioplasty without stent placement in the lower extremities just over a year ago, okay to restart aspirin and Plavix as per Gi  , c/n  Lipitor, isosorbide  and metoprolol   Code Status : full   Disposition Plan  : home   Consults  :  Gi  Discharge Condition: stable  Follow UP-  Cardiologist/ PCP    Diet and Activity recommendation:  As advised  Discharge Instructions     Discharge Instructions    Call MD for:  difficulty breathing, headache or visual disturbances   Complete by:  As directed    Call MD for:  persistant dizziness or light-headedness   Complete by:  As directed    Call MD for:  persistant nausea and vomiting   Complete by:  As directed    Call MD for:  severe uncontrolled pain   Complete by:  As directed    Call MD for:  temperature >100.4   Complete by:  As directed    Diet - low sodium heart healthy   Complete by:  As directed    Discharge instructions   Complete by:  As directed     1) stop using no sprays as this may have contributed to your nosebleeds 2) restart aspirin and Plavix for your heart and stents in your legs 3) see your doctor in 1-2 weeks for repeat complete blood count test 4)Avoid ibuprofen/Advil/Aleve/Motrin/Goody Powders/Naproxen/BC powders/Meloxicam/Diclofenac/Indomethacin and other Nonsteroidal anti-inflammatory medications as these will make you more likely to bleed and can cause stomach ulcers, can also cause Kidney problems.   Increase activity slowly   Complete by:  As directed         Discharge Medications     Allergies as of 01/01/2018   No Known Allergies     Medication List    STOP taking these medications   amoxicillin-clavulanate 875-125 MG tablet Commonly known as:  AUGMENTIN     TAKE these medications   albuterol 108 (90 Base) MCG/ACT inhaler Commonly known as:  PROVENTIL HFA;VENTOLIN HFA Inhale 1-2 puffs into the lungs every 6 (six) hours as needed for wheezing or shortness of breath.   amLODipine 10 MG tablet Commonly known as:  NORVASC Take 10 mg by mouth daily.   aspirin EC 81 MG tablet Take 81 mg by mouth daily.   atorvastatin 40 MG tablet Commonly known as:  LIPITOR Take 1 tablet (40 mg total) by mouth daily at 6 PM.   benzonatate 100 MG capsule Commonly known as:  TESSALON Take 1 capsule (100 mg total) by mouth every 8 (eight) hours. What changed:    when to take this  reasons to take this   budesonide-formoterol 80-4.5 MCG/ACT inhaler Commonly known as:  SYMBICORT Inhale 2 puffs into the lungs 2 (two) times daily.   clopidogrel 75 MG tablet Commonly known as:  PLAVIX Take 75 mg by mouth daily.   guaiFENesin 600 MG 12 hr tablet Commonly known as:  MUCINEX Take 1 tablet (600 mg total) by mouth 2 (two) times daily for 10 days.   HYDROcodone-acetaminophen 5-325 MG tablet Commonly known as:  NORCO/VICODIN Take 1-2 tablets by mouth every 6 hours as needed for cough.   isosorbide mononitrate 60 MG  24 hr tablet Commonly known as:  IMDUR Take 60 mg by mouth daily.   metoprolol tartrate 50 MG tablet Commonly known as:  LOPRESSOR Take 50 mg by mouth 2 (two) times daily.   pantoprazole 40 MG tablet Commonly known as:  PROTONIX Take 1 tablet (40 mg total) by mouth daily. Start taking on:  01/02/2018       Major procedures and Radiology Reports - PLEASE review detailed and  final reports for all details, in brief -    Dg Chest 1 View  Result Date: 12/30/2017 CLINICAL DATA:  Cough for 3 weeks. EXAM: CHEST  1 VIEW COMPARISON:  Radiograph 12/15/2017, 11/30/2015 FINDINGS: Right lung scarring is unchanged from prior exam. No consolidation. Unchanged heart size and mediastinal contours. No pulmonary edema, pleural effusion or pneumothorax. No acute osseous abnormalities are seen. IMPRESSION: 1. No acute abnormality. 2. Right lung scarring, unchanged. Electronically Signed   By: Jeb Levering M.D.   On: 12/30/2017 00:54   Dg Chest 2 View  Result Date: 12/15/2017 CLINICAL DATA:  Cough for 2 days.  History of COPD, ex-smoker. EXAM: CHEST - 2 VIEW COMPARISON:  Chest x-rays dated 11/30/2015 and 10/11/2015. FINDINGS: Heart size is within normal limits, stable. Overall cardiomediastinal silhouette is stable in size and configuration. Chronic scarring/fibrosis again noted within the right upper lobe, with stable pleuroparenchymal scarring at the right lung apex. Additional coarse lung markings at the lung bases are stable, presumably interstitial thickening related to chronic interstitial lung disease. No new lung findings. No confluent opacity to suggest a developing pneumonia. No pleural effusion or pneumothorax seen. No acute or suspicious osseous finding. IMPRESSION: 1. No active cardiopulmonary disease. No evidence of pneumonia or pulmonary edema. 2. Stable chronic scarring/fibrosis within the right upper lobe. Probable chronic interstitial lung disease at the lung bases, stable. Electronically  Signed   By: Franki Cabot M.D.   On: 12/15/2017 08:33    Micro Results   No results found for this or any previous visit (from the past 240 hour(s)).     Today   Subjective    Umberto Pavek today has no new complaints,  No further concerns about bleeding, had a soft brown BM          Patient has been seen and examined prior to discharge   Objective   Blood pressure 113/69, pulse 76, temperature 98 F (36.7 C), temperature source Oral, resp. rate 20, height 5\' 10"  (1.778 m), weight 79.4 kg (175 lb), SpO2 99 %.   Intake/Output Summary (Last 24 hours) at 01/01/2018 1025 Last data filed at 01/01/2018 0200 Gross per 24 hour  Intake 520 ml  Output 400 ml  Net 120 ml    Exam Gen:- Awake Alert,  In no apparent distress  HEENT:- [place].AT, No sclera icterus Neck-Supple Neck,No JVD,.  Lungs-  CTAB good air movement CV- S1, S2 normal Abd-  +ve B.Sounds, Abd Soft, No tenderness,    Extremity/Skin:- No  edema,   good pulses Psych-affect is appropriate, oriented x3 Neuro-no new focal deficits, no tremors   Data Review   CBC w Diff:  Lab Results  Component Value Date   WBC 8.4 01/01/2018   HGB 9.7 (L) 01/01/2018   HCT 31.6 (L) 01/01/2018   PLT 477 (H) 01/01/2018   LYMPHOPCT 9 (L) 06/02/2009   MONOPCT 7 06/02/2009   EOSPCT 0 06/02/2009   BASOPCT 1 06/02/2009    CMP:  Lab Results  Component Value Date   NA 139 01/01/2018   K 3.7 01/01/2018   CL 107 01/01/2018   CO2 23 01/01/2018   BUN 9 01/01/2018   CREATININE 0.95 01/01/2018   PROT 7.8 12/29/2017   ALBUMIN 3.4 (L) 12/29/2017   BILITOT 0.7 12/29/2017   ALKPHOS 93 12/29/2017   AST 20 12/29/2017   ALT 29 12/29/2017    Total Discharge time is about 33 minutes  Roxan Hockey M.D on 01/01/2018 at 10:25 AM  [facility]  Office  [phone]  Voice Recognition Viviann Spare dictation system was used to create this note, attempts have been made to correct errors. Please contact the author with questions  and/or clarifications.

## 2018-01-01 NOTE — Discharge Instructions (Signed)
1) stop using no sprays as this may have contributed to your nosebleeds 2) restart aspirin and Plavix for your heart and stents in your legs 3) see your doctor in 1-2 weeks for repeat complete blood count test 4)Avoid ibuprofen/Advil/Aleve/Motrin/Goody Powders/Naproxen/BC powders/Meloxicam/Diclofenac/Indomethacin and other Nonsteroidal anti-inflammatory medications as these will make you more likely to bleed and can cause stomach ulcers, can also cause Kidney problems.

## 2018-01-10 ENCOUNTER — Telehealth: Payer: Self-pay

## 2018-01-10 NOTE — Telephone Encounter (Signed)
I left him a message that his biopsy result was negative.

## 2018-01-11 ENCOUNTER — Institutional Professional Consult (permissible substitution): Payer: No Typology Code available for payment source | Admitting: Internal Medicine

## 2018-01-14 ENCOUNTER — Emergency Department (HOSPITAL_COMMUNITY): Payer: Medicaid Other

## 2018-01-14 ENCOUNTER — Other Ambulatory Visit: Payer: Self-pay

## 2018-01-14 ENCOUNTER — Emergency Department (HOSPITAL_COMMUNITY)
Admission: EM | Admit: 2018-01-14 | Discharge: 2018-01-14 | Disposition: A | Discharge status: Home / Self Care | Payer: Medicaid Other | Attending: Emergency Medicine | Admitting: Emergency Medicine

## 2018-01-14 ENCOUNTER — Encounter (HOSPITAL_COMMUNITY): Payer: Self-pay | Admitting: *Deleted

## 2018-01-14 DIAGNOSIS — R945 Abnormal results of liver function studies: Secondary | ICD-10-CM | POA: Insufficient documentation

## 2018-01-14 DIAGNOSIS — J40 Bronchitis, not specified as acute or chronic: Secondary | ICD-10-CM

## 2018-01-14 DIAGNOSIS — J984 Other disorders of lung: Secondary | ICD-10-CM | POA: Diagnosis not present

## 2018-01-14 DIAGNOSIS — R7989 Other specified abnormal findings of blood chemistry: Secondary | ICD-10-CM

## 2018-01-14 DIAGNOSIS — J441 Chronic obstructive pulmonary disease with (acute) exacerbation: Secondary | ICD-10-CM | POA: Diagnosis not present

## 2018-01-14 DIAGNOSIS — R05 Cough: Secondary | ICD-10-CM | POA: Diagnosis present

## 2018-01-14 DIAGNOSIS — J440 Chronic obstructive pulmonary disease with acute lower respiratory infection: Secondary | ICD-10-CM | POA: Diagnosis not present

## 2018-01-14 LAB — CBC WITH DIFFERENTIAL/PLATELET
BASOS PCT: 1 %
Basophils Absolute: 0 10*3/uL (ref 0.0–0.1)
EOS ABS: 0.4 10*3/uL (ref 0.0–0.7)
EOS PCT: 6 %
HEMATOCRIT: 34.5 % — AB (ref 39.0–52.0)
Hemoglobin: 10.8 g/dL — ABNORMAL LOW (ref 13.0–17.0)
Lymphocytes Relative: 36 %
Lymphs Abs: 2.5 10*3/uL (ref 0.7–4.0)
MCH: 25.1 pg — ABNORMAL LOW (ref 26.0–34.0)
MCHC: 31.3 g/dL (ref 30.0–36.0)
MCV: 80 fL (ref 78.0–100.0)
MONO ABS: 1.3 10*3/uL — AB (ref 0.1–1.0)
MONOS PCT: 18 %
NEUTROS ABS: 2.7 10*3/uL (ref 1.7–7.7)
Neutrophils Relative %: 39 %
PLATELETS: 657 10*3/uL — AB (ref 150–400)
RBC: 4.31 MIL/uL (ref 4.22–5.81)
RDW: 15.8 % — AB (ref 11.5–15.5)
WBC: 6.9 10*3/uL (ref 4.0–10.5)

## 2018-01-14 LAB — COMPREHENSIVE METABOLIC PANEL
ALBUMIN: 3.6 g/dL (ref 3.5–5.0)
ALT: 247 U/L — ABNORMAL HIGH (ref 17–63)
ANION GAP: 11 (ref 5–15)
AST: 185 U/L — AB (ref 15–41)
Alkaline Phosphatase: 545 U/L — ABNORMAL HIGH (ref 38–126)
BILIRUBIN TOTAL: 2.3 mg/dL — AB (ref 0.3–1.2)
BUN: 10 mg/dL (ref 6–20)
CHLORIDE: 103 mmol/L (ref 101–111)
CO2: 22 mmol/L (ref 22–32)
Calcium: 9.5 mg/dL (ref 8.9–10.3)
Creatinine, Ser: 1.28 mg/dL — ABNORMAL HIGH (ref 0.61–1.24)
GFR calc Af Amer: >60 mL/min (ref >60)
GFR calc non Af Amer: 59 mL/min — ABNORMAL LOW (ref >60)
GLUCOSE: 110 mg/dL — AB (ref 65–99)
POTASSIUM: 4.4 mmol/L (ref 3.5–5.1)
SODIUM: 136 mmol/L (ref 135–145)
TOTAL PROTEIN: 8.2 g/dL — AB (ref 6.5–8.1)

## 2018-01-14 LAB — URINALYSIS, ROUTINE W REFLEX MICROSCOPIC
BILIRUBIN URINE: NEGATIVE
Glucose, UA: NEGATIVE mg/dL
Hgb urine dipstick: NEGATIVE
KETONES UR: NEGATIVE mg/dL
Leukocytes, UA: NEGATIVE
NITRITE: NEGATIVE
Protein, ur: NEGATIVE mg/dL
pH: 6 (ref 5.0–8.0)

## 2018-01-14 LAB — I-STAT TROPONIN, ED: Troponin i, poc: 0 ng/mL (ref 0.00–0.08)

## 2018-01-14 LAB — I-STAT CG4 LACTIC ACID, ED: LACTIC ACID, VENOUS: 1.44 mmol/L (ref 0.5–1.9)

## 2018-01-14 LAB — TYPE AND SCREEN
ABO/RH(D): O NEG
ANTIBODY SCREEN: NEGATIVE

## 2018-01-14 LAB — LIPASE, BLOOD: Lipase: 28 U/L (ref 11–51)

## 2018-01-14 MED ORDER — DIPHENHYDRAMINE HCL 50 MG/ML IJ SOLN
25.0000 mg | Freq: Once | INTRAMUSCULAR | Status: AC
  Administered 2018-01-14: 25 mg via INTRAVENOUS
  Filled 2018-01-14: qty 1

## 2018-01-14 MED ORDER — SODIUM CHLORIDE 0.9 % IV BOLUS
1000.0000 mL | Freq: Once | INTRAVENOUS | Status: AC
  Administered 2018-01-14: 1000 mL via INTRAVENOUS

## 2018-01-14 MED ORDER — IOPAMIDOL (ISOVUE-370) INJECTION 76%
100.0000 mL | Freq: Once | INTRAVENOUS | Status: AC | PRN
  Administered 2018-01-14: 100 mL via INTRAVENOUS

## 2018-01-14 MED ORDER — ALBUTEROL SULFATE (2.5 MG/3ML) 0.083% IN NEBU
5.0000 mg | INHALATION_SOLUTION | Freq: Once | RESPIRATORY_TRACT | Status: AC
  Administered 2018-01-14: 5 mg via RESPIRATORY_TRACT
  Filled 2018-01-14: qty 6

## 2018-01-14 MED ORDER — METHYLPREDNISOLONE SODIUM SUCC 125 MG IJ SOLR
125.0000 mg | Freq: Once | INTRAMUSCULAR | Status: AC
  Administered 2018-01-14: 125 mg via INTRAVENOUS
  Filled 2018-01-14: qty 2

## 2018-01-14 MED ORDER — PREDNISONE 20 MG PO TABS: ORAL_TABLET | ORAL | 0 refills | Status: DC

## 2018-01-14 MED ORDER — ALBUTEROL SULFATE HFA 108 (90 BASE) MCG/ACT IN AERS
2.0000 | INHALATION_SPRAY | Freq: Once | RESPIRATORY_TRACT | Status: AC
  Administered 2018-01-14: 2 via RESPIRATORY_TRACT
  Filled 2018-01-14: qty 6.7

## 2018-01-14 MED ORDER — ALBUTEROL SULFATE (2.5 MG/3ML) 0.083% IN NEBU
5.0000 mg | INHALATION_SOLUTION | Freq: Once | RESPIRATORY_TRACT | Status: AC
Start: 2018-01-14 — End: 2018-01-14
  Administered 2018-01-14: 5 mg via RESPIRATORY_TRACT
  Filled 2018-01-14: qty 6

## 2018-01-14 MED ORDER — IOPAMIDOL (ISOVUE-370) INJECTION 76%
INTRAVENOUS | Status: AC
  Filled 2018-01-14: qty 100

## 2018-01-14 MED ORDER — IPRATROPIUM BROMIDE 0.02 % IN SOLN
0.5000 mg | Freq: Once | RESPIRATORY_TRACT | Status: AC
  Administered 2018-01-14: 0.5 mg via RESPIRATORY_TRACT
  Filled 2018-01-14: qty 2.5

## 2018-01-14 NOTE — ED Notes (Signed)
US at bedside

## 2018-01-14 NOTE — ED Provider Notes (Signed)
[facility] DEPT Provider Note   CSN: [id number] Arrival date & time: 01/14/18  0715     History   Chief Complaint Chief Complaint  Patient presents with  . Cough    HPI Donald Leblanc is a 62 y.o. male history of COPD, CAD with previous stents on Plavix, here presenting with cough, chest pain.  Patient was recently admitted for possible upper GI bleed but endoscopy showed only showed small duodenal polyps.  Patient is currently on PPI but is also on aspirin and Plavix due to previous stents.  Patient states that for the last 2 to 3 weeks, he has been having nonproductive cough.  He states that nothing comes out when he tries to cough and when he coughs, he has some chest pressure.  Denies any chest pain currently and states that this is different than prior to his cardiac stents.  She states that over the last several weeks, he has intermittent nosebleeds that improved with afrin. Denies and melena or abdominal pain. Denies any shortness of breath.   The history is provided by the patient.    Past Medical History:  Diagnosis Date  . Back pain   . Colon polyps   . COPD (chronic obstructive pulmonary disease) ([place])   . Coronary artery disease   . Hx of substance abuse   . PVD (peripheral vascular disease) ([place])   . Seizures ([place])   . Thrombocytosis [facility])     Patient Active Problem List   Diagnosis Date Noted  . Upper GI bleeding 12/29/2017  . COPD GOLD 0 12/23/2017  . Bronchiectasis without complication ([place]) 62/22/9798  . Pain in right ankle and joints of right foot 05/04/2017    Past Surgical History:  Procedure Laterality Date  . CORONARY ANGIOPLASTY WITH STENT PLACEMENT    . ESOPHAGOGASTRODUODENOSCOPY (EGD) WITH PROPOFOL N/A 12/30/2017   Procedure: ESOPHAGOGASTRODUODENOSCOPY (EGD) WITH PROPOFOL;  Surgeon: Jackquline Denmark, MD;  Location: [facility] ENDOSCOPY;  Service: Endoscopy;  Laterality: N/A;  . HAND SURGERY          Home Medications     Prior to Admission medications   Medication Sig Start Date End Date Taking? Authorizing Provider  albuterol (PROVENTIL HFA;VENTOLIN HFA) 108 (90 Base) MCG/ACT inhaler Inhale 1-2 puffs into the lungs every 6 (six) hours as needed for wheezing or shortness of breath. 12/15/17   Pisciotta, Elmyra Ricks, PA-C  amLODipine (NORVASC) 10 MG tablet Take 10 mg by mouth daily.    [provider]  aspirin EC 81 MG tablet Take 81 mg by mouth daily.    [provider]  atorvastatin (LIPITOR) 40 MG tablet Take 1 tablet (40 mg total) by mouth daily at 6 PM. 01/01/18   Emokpae, Courage, MD  benzonatate (TESSALON) 100 MG capsule Take 1 capsule (100 mg total) by mouth every 8 (eight) hours. Patient taking differently: Take 100 mg by mouth 3 (three) times daily as needed for cough.  11/30/15   Kirichenko, Lahoma Rocker, PA-C  budesonide-formoterol (SYMBICORT) 80-4.5 MCG/ACT inhaler Inhale 2 puffs into the lungs 2 (two) times daily. 12/23/17   Tanda Rockers, MD  clopidogrel (PLAVIX) 75 MG tablet Take 75 mg by mouth daily.    [provider]  HYDROcodone-acetaminophen (NORCO/VICODIN) 5-325 MG tablet Take 1-2 tablets by mouth every 6 hours as needed for cough. 12/15/17   Pisciotta, Elmyra Ricks, PA-C  isosorbide mononitrate (IMDUR) 60 MG 24 hr tablet Take 60 mg by mouth daily.    [provider]  metoprolol (LOPRESSOR) 50  MG tablet Take 50 mg by mouth 2 (two) times daily.    [provider]  pantoprazole (PROTONIX) 40 MG tablet Take 1 tablet (40 mg total) by mouth daily. 01/02/18   Roxan Hockey, MD    Family History Family History  Problem Relation Age of Onset  . Cancer Mother        Unknown  . Colon cancer Neg Hx   . Pancreatic cancer Neg Hx     Social History Social History   Tobacco Use  . Smoking status: Former Smoker    Packs/day: 1.50    Years: 20.00    Pack years: 30.00    Types: Cigarettes    Last attempt to quit: 12/23/1997    Years since quitting: 20.0  . Smokeless  tobacco: Never Used  Substance Use Topics  . Alcohol use: No  . Drug use: No     Allergies   Patient has no known allergies.   Review of Systems Review of Systems  Respiratory: Positive for cough.   All other systems reviewed and are negative.    Physical Exam Updated Vital Signs BP 90/60 (BP Location: Right Arm)   Pulse 83   Temp 97.8 F (36.6 C) (Oral)   Resp 20   Ht '5\' 10"'  (1.778 m)   Wt 84.4 kg (186 lb)   SpO2 92%   BMI 26.69 kg/m   Physical Exam  Constitutional: He is oriented to person, place, and time. He appears well-developed and well-nourished.  HENT:  Head: Normocephalic.  Mouth/Throat: Oropharynx is clear and moist.  Eyes: Pupils are equal, round, and reactive to light. Conjunctivae and EOM are normal.  Neck: Normal range of motion. Neck supple.  Cardiovascular: Normal rate, regular rhythm and normal heart sounds.  Pulmonary/Chest: Effort normal and breath sounds normal.  No wheezing or crackles   Abdominal: Soft. Bowel sounds are normal. He exhibits no distension. There is no tenderness.  Musculoskeletal: Normal range of motion.  Neurological: He is alert and oriented to person, place, and time.  Skin: Skin is warm.  Psychiatric: He has a normal mood and affect.  Nursing note and vitals reviewed.    ED Treatments / Results  Labs (all labs ordered are listed, but only abnormal results are displayed) Labs Reviewed  CBC WITH DIFFERENTIAL/PLATELET  COMPREHENSIVE METABOLIC PANEL  I-STAT TROPONIN, ED  TYPE AND SCREEN    EKG None  Radiology No results found.  Procedures Procedures (including critical care time)  Medications Ordered in ED Medications  sodium chloride 0.9 % bolus 1,000 mL (has no administration in time range)     Initial Impression / Assessment and Plan / ED Course  I have reviewed the triage vital signs and the nursing notes.  Pertinent labs & imaging results that were available during my care of the patient were  reviewed by me and considered in my medical decision making (see chart for details).     Donald Leblanc is a 62 y.o. male hx of CAD on plavix, here with cough. Hx of COPD so consider COPD exacerbation vs bronchitis vs pneumonia. He has CAD but has no active chest pain. Low suspicion for PE. Will get labs, trop x 2, CXR. Will give nebs and reassess.   11 am Patient's LFTs elevated and had Alk Phos 500. His LFTs were normal on 4/10. CT ab/pel and RUQ US unremarkable. I called [facility] and talked to Janett Billow, NP for [employer]. He had recent endoscopy that was unremarkable. She thinks its  unlikely to be related to the procedure and recommend hepatitis panel to be sent. They will follow up with him next week and will repeat LFTs in the office. He also briefly desat to 88% on RA and CTA showed no PE, just some R upper lobe scarring. Patient has previous hx of TB and that is likely causing his R upper lobe scarring. Also a smoker so consider COPD as well.   12:15 PM Given steroids. O2 96% on RA when ambulating. No wheezing currently. Has no low grade temp or productive cough to suggest reactivation TB. I think likely COPD exacerbation. His WBC is normal, lactate normal and CT showed no obvious infiltrate. Will dc home with steroids, albuterol. Will have him go to [facility] next week to recheck CMP.   Final Clinical Impressions(s) / ED Diagnoses   Final diagnoses:  None    ED Discharge Orders    None       Drenda Freeze, MD 01/14/18 1217

## 2018-01-14 NOTE — ED Triage Notes (Signed)
Pt complains of cough for the past few weeks. Pt states he has chest pain when he coughs. Pt denies chest pain at this time.

## 2018-01-14 NOTE — Discharge Instructions (Addendum)
Stay hydrated.   Take prednisone as prescribed.   Use albuterol as needed for cough or wheezing.   You have lung scarring from previous TB exposure.   See your doctor for follow up next week.   Your liver functions are slightly elevated. Avoid taking tylenol. Recheck liver function in [facility] office next week   Return to ER if you have worse trouble breathing, shortness of breath, fever, cough, abdominal pain, vomiting.

## 2018-01-14 NOTE — ED Notes (Addendum)
Noted pt oxygen saturation to be 90% with resting/laying in bed. Oxygen saturation in mid 90s with talking and sitting up/not resting. Pt ambulated in room and oxygen saturation noted to be 96%. All readings on RA. Darl Householder MD notified of the above.

## 2018-01-15 LAB — HEPATITIS PANEL, ACUTE
HEP A IGM: NEGATIVE
HEP B C IGM: NEGATIVE
Hepatitis B Surface Ag: NEGATIVE

## 2018-01-18 ENCOUNTER — Other Ambulatory Visit: Payer: Self-pay

## 2018-01-18 DIAGNOSIS — R945 Abnormal results of liver function studies: Principal | ICD-10-CM

## 2018-01-18 DIAGNOSIS — R7989 Other specified abnormal findings of blood chemistry: Secondary | ICD-10-CM

## 2018-01-19 ENCOUNTER — Telehealth: Payer: Self-pay

## 2018-01-19 LAB — CULTURE, BLOOD (ROUTINE X 2)
Culture: NO GROWTH
Culture: NO GROWTH
SPECIAL REQUESTS: ADEQUATE

## 2018-01-19 NOTE — Telephone Encounter (Signed)
I spoke with him and he declined a follow-up with Dr Lyndel Safe.  He will have repeat LFT's in a week.  Order in.

## 2018-01-27 ENCOUNTER — Ambulatory Visit (INDEPENDENT_AMBULATORY_CARE_PROVIDER_SITE_OTHER): Payer: No Typology Code available for payment source | Admitting: Internal Medicine

## 2018-01-27 ENCOUNTER — Encounter: Payer: Self-pay | Admitting: Internal Medicine

## 2018-01-27 VITALS — BP 120/76 | HR 74 | Ht 70.5 in | Wt 178.0 lb

## 2018-01-27 DIAGNOSIS — J479 Bronchiectasis, uncomplicated: Secondary | ICD-10-CM

## 2018-01-27 DIAGNOSIS — J449 Chronic obstructive pulmonary disease, unspecified: Secondary | ICD-10-CM

## 2018-01-27 LAB — PULMONARY FUNCTION TEST
DL/VA % pred: 82 %
DL/VA: 3.86 ml/min/mmHg/L
DLCO COR % PRED: 45 %
DLCO COR: 14.94 ml/min/mmHg
DLCO unc % pred: 39 %
DLCO unc: 13.05 ml/min/mmHg
FEF 25-75 Post: 1.76 L/sec
FEF 25-75 Pre: 1.93 L/sec
FEF2575-%CHANGE-POST: -8 %
FEF2575-%PRED-PRE: 66 %
FEF2575-%Pred-Post: 60 %
FEV1-%Change-Post: 1 %
FEV1-%PRED-PRE: 59 %
FEV1-%Pred-Post: 60 %
FEV1-Post: 1.91 L
FEV1-Pre: 1.87 L
FEV1FVC-%CHANGE-POST: 5 %
FEV1FVC-%Pred-Pre: 102 %
FEV6-%Change-Post: -2 %
FEV6-%PRED-PRE: 59 %
FEV6-%Pred-Post: 58 %
FEV6-PRE: 2.34 L
FEV6-Post: 2.28 L
FEV6FVC-%Change-Post: 0 %
FEV6FVC-%Pred-Post: 104 %
FEV6FVC-%Pred-Pre: 103 %
FVC-%Change-Post: -3 %
FVC-%PRED-POST: 55 %
FVC-%PRED-PRE: 57 %
FVC-POST: 2.28 L
FVC-PRE: 2.35 L
POST FEV1/FVC RATIO: 84 %
POST FEV6/FVC RATIO: 100 %
PRE FEV1/FVC RATIO: 80 %
Pre FEV6/FVC Ratio: 99 %
RV % pred: 75 %
RV: 1.74 L
TLC % pred: 60 %
TLC: 4.27 L

## 2018-01-27 MED ORDER — BUDESONIDE-FORMOTEROL FUMARATE 160-4.5 MCG/ACT IN AERO: 2.0000 | INHALATION_SPRAY | Freq: Two times a day (BID) | RESPIRATORY_TRACT | 0 refills | Status: DC

## 2018-01-27 NOTE — Assessment & Plan Note (Signed)
Quit smoking 1999 - Spirometry 12/23/2017  FEV1 1.70 (54%)  Ratio 81 no significant curvature  - 12/23/2017   rx AB component with symb 80 2bid    - PFT's  01/27/2018  FEV1 1.91  (60 % ) ratio 84  p 1 % improvement from saba p nothing prior to study with DLCO  39/45 % corrects to 82  % for alv volume   - 01/27/2018  After extensive coaching inhaler device  effectiveness =    75% from baseline 25%    Clearly this is not copd but may have enough AB to warrant prn symbicort  Based on the study from NEJM  378; 20 p 1865 (2018) in pts with mild asthma it is reasonable to use low dose symbicort eg 80 2bid "prn" flare in this setting but I emphasized this was only shown with symbicort and takes advantage of the rapid onset of action but is not the same as "rescue therapy" but can be stopped once the acute symptoms have resolved and the need for rescue has been minimized (< 2 x weekly)     I had an extended discussion with the patient reviewing all relevant studies completed to date and  lasting 15 to 20 minutes of a 25 minute visit    See device teaching which extended face to face time for this visit   Each maintenance medication was reviewed in detail including most importantly the difference between maintenance and prns and under what circumstances the prns are to be triggered using an action plan format that is not reflected in the computer generated alphabetically organized AVS.    Please see AVS for specific instructions unique to this visit that I personally wrote and verbalized to the the pt in detail and then reviewed with pt  by my nurse highlighting any  changes in therapy recommended at today's visit to their plan of care.

## 2018-01-27 NOTE — Progress Notes (Signed)
PFT completed today.  

## 2018-01-27 NOTE — Progress Notes (Signed)
Subjective:     Patient ID: Donald Leblanc, male   DOB: [birth year]    MRN: [id number]    Brief patient profile:  61 yobm quit smoking 1999 with a cough that seemed to improved then breathing got worse around 2005 some better on albuterol referred to pulmonary clinic 12/23/2017 by Dr Sherral Hammers       History of Present Illness  12/23/2017 1st [facility] office visit/ Donald Leblanc   Chief Complaint  Patient presents with  . Consult    Pt seen in the ED 12/15/17 and from a cxr, chronic scarring was shown. Pt has c/o cough, SOB all the time, and CP.  doe = MMRC3 = can't walk 100 yards even at a slow pace at a flat grade s stopping due to sob   Mailbox back to house ok  Last used inhaler = saba  around 4 h prior to OV and "helps some"   Much worse  Cough starting around 12/14/17 much worse cough prod yellow mucus blood while on symbicort with nothing mucus > rec   use alb / prednisone  > he misunderstood and stopped albuterol, continues to have severe cough worse at hs  rec We will need to obtain your TB records from the [facility]    Plan A = Automatic = Symbicort 80 Take 2 puffs first thing in am and then another 2 puffs about 12 hours later.  Work on inhaler technique:    Plan B = Backup Only use your albuterol (Proair) as a rescue medication Augmentin 875 mg take one pill twice daily  X 10 days     01/27/2018  f/u ov/Brittley Regner re:  Copd 0 ? Ab on symb 80 2 in am and usually forgets the pm dose  Chief Complaint  Patient presents with  . Follow-up    PFT's done today. Breathing is doing well. He has not had to use his rescue inhaler.   Dyspnea:  MMRC2 = can't walk a nl pace on a flat grade s sob but does fine slow and flat / also limited by back  Cough: none Sleep: ok  SABA use:   None at all  - can't really tell much p symb vs if leaves it off (though hfa baseline still poor)   Best he's been in years!  No obvious day to day or daytime variability or assoc excess/ purulent sputum or mucus  plugs or hemoptysis or cp or chest tightness, subjective wheeze or overt sinus or hb symptoms. No unusual exposure hx or h/o childhood pna/ asthma or knowledge of premature birth.  Sleeping  Ok flat   without nocturnal  or early am exacerbation  of respiratory  c/o's or need for noct saba. Also denies any obvious fluctuation of symptoms with weather or environmental changes or other aggravating or alleviating factors except as outlined above   Current Allergies, Complete Past Medical History, Past Surgical History, Family History, and Social History were reviewed in [employer] record.  ROS  The following are not active complaints unless bolded Hoarseness, sore throat, dysphagia, dental problems, itching, sneezing,  nasal congestion or discharge of excess mucus or purulent secretions, ear ache,   fever, chills, sweats, unintended wt loss or wt gain, classically pleuritic or exertional cp,  orthopnea pnd or arm/hand swelling  or leg swelling, presyncope, palpitations, abdominal pain, anorexia, nausea, vomiting, diarrhea  or change in bowel habits or change in bladder habits, change in stools or change in urine, dysuria, hematuria,  rash, arthralgias, visual complaints, headache, numbness, weakness or ataxia or problems with walking or coordination,  change in mood or  memory.        Current Meds  Medication Sig  . albuterol (PROVENTIL HFA;VENTOLIN HFA) 108 (90 Base) MCG/ACT inhaler Inhale 1-2 puffs into the lungs every 6 (six) hours as needed for wheezing or shortness of breath.  Marland Kitchen amLODipine (NORVASC) 10 MG tablet Take 10 mg by mouth daily.  Marland Kitchen aspirin EC 81 MG tablet Take 81 mg by mouth daily.  Marland Kitchen atorvastatin (LIPITOR) 40 MG tablet Take 1 tablet (40 mg total) by mouth daily at 6 PM.  . benzonatate (TESSALON) 100 MG capsule Take 1 capsule (100 mg total) by mouth every 8 (eight) hours. (Patient taking differently: Take 100 mg by mouth 3 (three) times daily as needed for cough. )   . budesonide-formoterol (SYMBICORT) 80-4.5 MCG/ACT inhaler Inhale 2 puffs into the lungs 2 (two) times daily.  . clopidogrel (PLAVIX) 75 MG tablet Take 75 mg by mouth daily.  Marland Kitchen HYDROcodone-acetaminophen (NORCO/VICODIN) 5-325 MG tablet Take 1-2 tablets by mouth every 6 hours as needed for cough.  . isosorbide mononitrate (IMDUR) 60 MG 24 hr tablet Take 60 mg by mouth daily.  . metoprolol (LOPRESSOR) 50 MG tablet Take 50 mg by mouth 2 (two) times daily.  . pantoprazole (PROTONIX) 40 MG tablet Take 1 tablet (40 mg total) by mouth daily.            Objective:   Physical Exam   amb bm nad   01/27/2018          178   12/23/17 183 lb 12.8 oz (83.4 kg)  11/08/17 188 lb (85.3 kg)  10/27/17 188 lb 4 oz (85.4 kg)     Vital signs reviewed - Note on arrival 02 sats  96% on RA     HEENT: nl dentition, turbinates bilaterally, and oropharynx. Nl external ear canals without cough reflex   NECK :  without JVD/Nodes/TM/ nl carotid upstrokes bilaterally   LUNGS: no acc muscle use,  Nl contour chest with minimal insp/exp rhonchi R > L  without cough on insp or exp maneuvers   CV:  RRR  no s3 or murmur or increase in P2, and no edema   ABD:  soft and nontender with nl inspiratory excursion in the supine position. No bruits or organomegaly appreciated, bowel sounds nl  MS:  Nl gait/ ext warm without deformities, calf tenderness, cyanosis or clubbing No obvious joint restrictions   SKIN: warm and dry without lesions    NEURO:  alert, approp, nl sensorium with  no motor or cerebellar deficits apparent.       I personally reviewed images and agree with radiology impression as follows:   Chest CTa 01/14/18 1. No evidence of pulmonary embolism. No acute intrathoracic process. 2. Chronic postinflammatory/postinfectious changes in the right upper lobe and superior segment of the right lower lobe with slightly increased pleural scarring and volume loss. 3. Prominent mediastinal lymph nodes  measuring up to 13 mm in short axis, nonspecific, possibly reactive.     Assessment:

## 2018-01-27 NOTE — Patient Instructions (Addendum)
Symbicort 80 up to 2 puff every 12 hours for problems with breathing or coughing    Only use your albuterol as a rescue medication to be used if you can't catch your breath by resting or doing a relaxed purse lip breathing pattern.  - The less you use it, the better it will work when you need it. - Ok to use up to 2 puffs  every 4 hours if you must but call for immediate appointment if use goes up over your usual need - Don't leave home without it !!  (think of it like the spare tire for your car)    If you are satisfied with your treatment plan,  let your doctor know and he/she can either refill your medications or you can return here when your prescription runs out.     If in any way you are not 100% satisfied,  please tell us.  If 100% better, tell your friends!  Pulmonary follow up is as needed

## 2018-01-27 NOTE — Assessment & Plan Note (Signed)
-   rx per HD for TB ? When completed > req records 12/23/2017  But rx included Directly observed rx and shots per pt for a year - CT chest 06/03/09 Findings in the right upper lobe compatible with chronic post infectious or inflammatory bronchiectasis and volume loss. - CTa chest 01/14/18 scarring only   No directed f/u indicated at this point as these findings are perfectly c/w his h/o adequately rx'd pulmonary tb

## 2018-09-19 IMAGING — CT CT ABD-PELV W/ CM
3 of 11 series · 10 of 46 positions shown, 16 images · IV contrast (ISOVUE 370)
Comparison: CT chest dated June 03, 2009.

CLINICAL DATA: Cough with chest pain and shortness of breath for
the past few weeks. Also complaining of lower back pain.

EXAM:
CT ANGIOGRAPHY CHEST
CT ABDOMEN AND PELVIS WITH CONTRAST
TECHNIQUE: Multidetector CT imaging of the chest was performed using the
standard protocol during bolus administration of intravenous
contrast. Multiplanar CT image reconstructions and MIPs were
obtained to evaluate the vascular anatomy. Multidetector CT imaging
of the abdomen and pelvis was performed using the standard protocol
during bolus administration of intravenous contrast.
CONTRAST:  100mL [id number]-PEO IOPAMIDOL (U5P0HH-PEO) INJECTION 76%

[Series 4: axial st · axial · 0.69mm/px · z∈[-523,-238]mm · 4 of 97 slices shown, 9 images]
[im 20/97  soft-tissue]
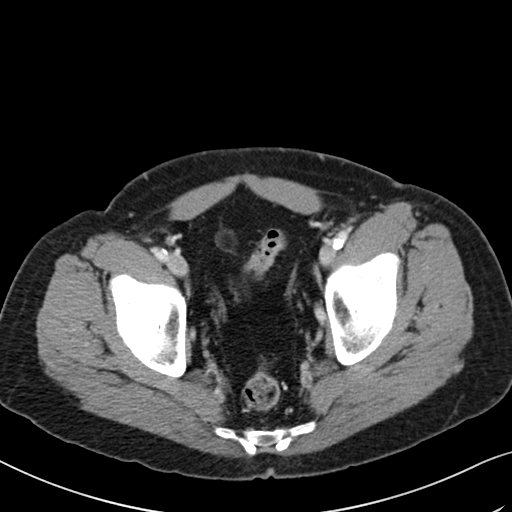
[im 20/97  lung]
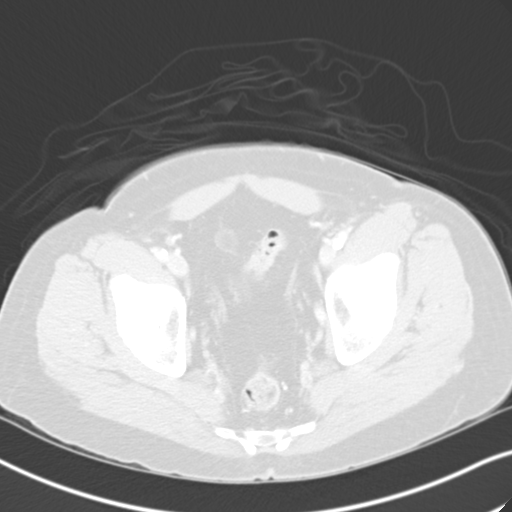
[im 20/97  bone]
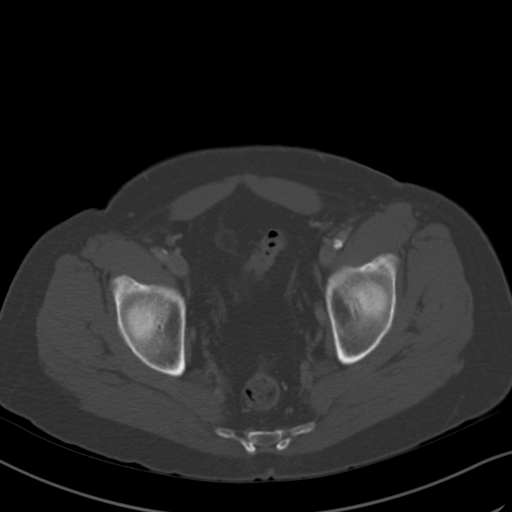
[im 39/97  soft-tissue]
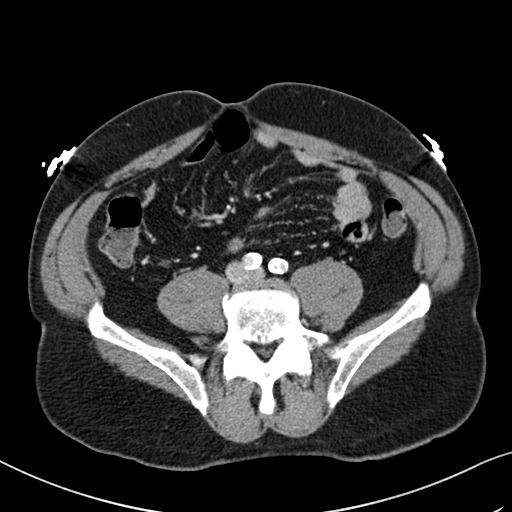
[im 39/97  lung]
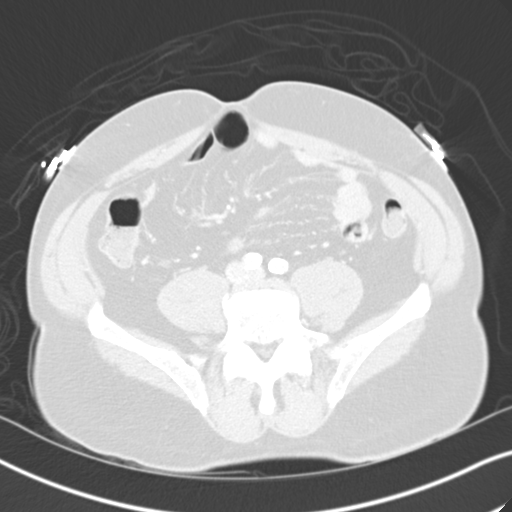
[im 58/97  soft-tissue]
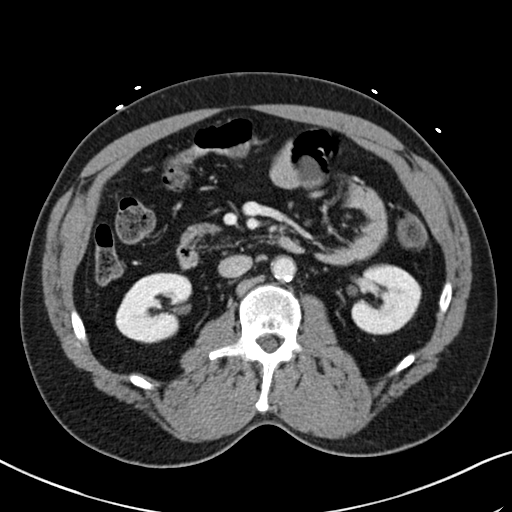
[im 58/97  lung]
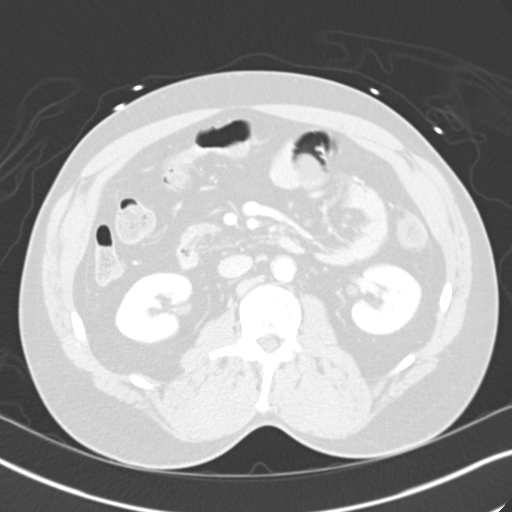
[im 77/97  soft-tissue]
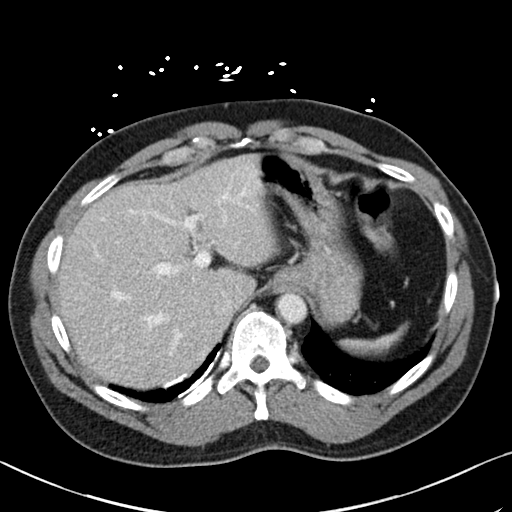
[im 77/97  lung]
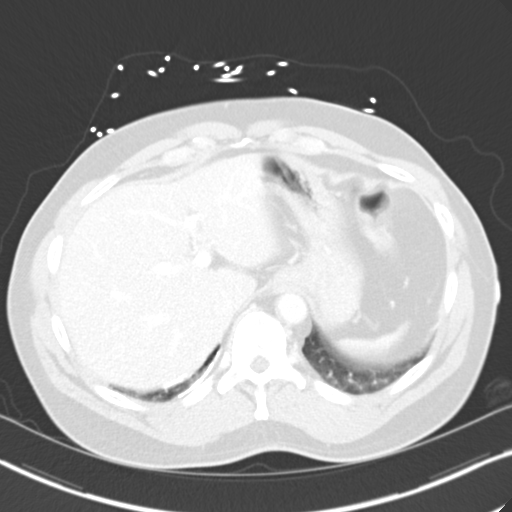

[Series 8: thins · axial · 0.71mm/px · z∈[-233,-147]mm · 4 of 242 slices shown]
[im 18/242  soft-tissue]
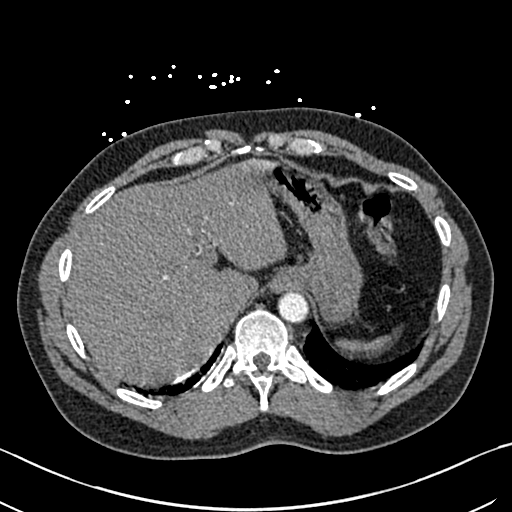
[im 52/242  soft-tissue]
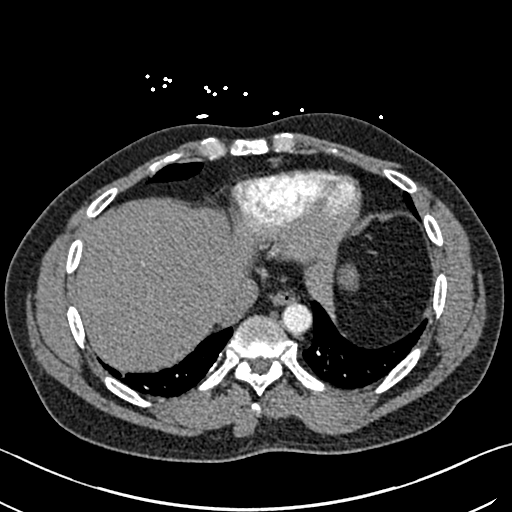
[im 87/242  soft-tissue]
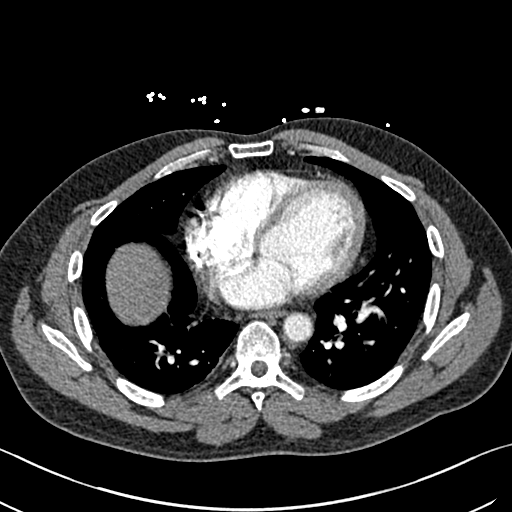
[im 104/242  soft-tissue]
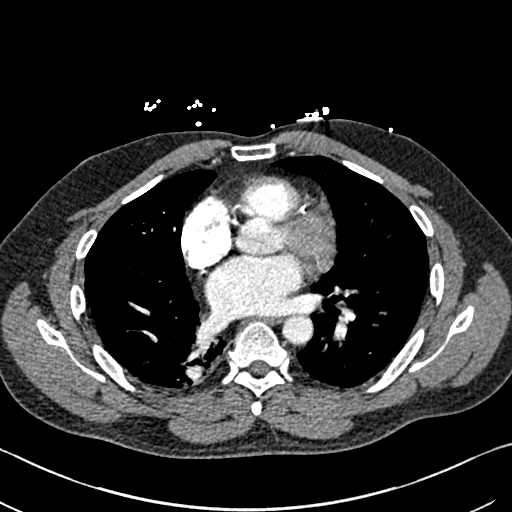

[Series 9: coronal mpr · coronal · 0.47mm/px · 2 of 142 slices shown, 3 images]
[im 48/142  soft-tissue]
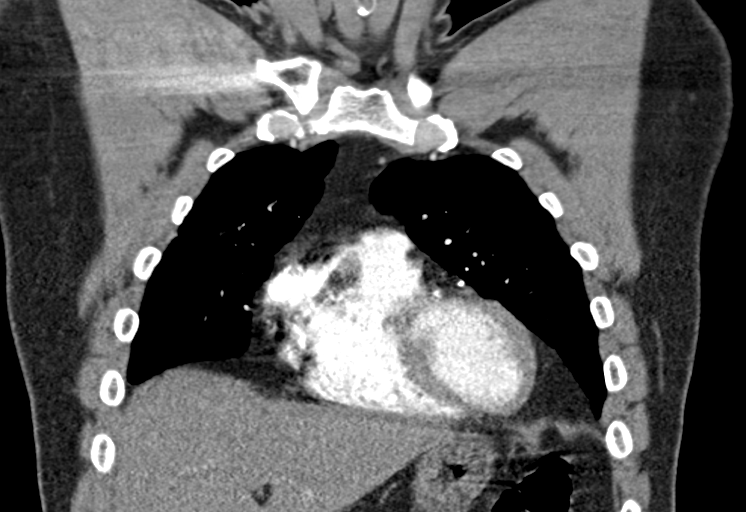
[im 48/142  bone]
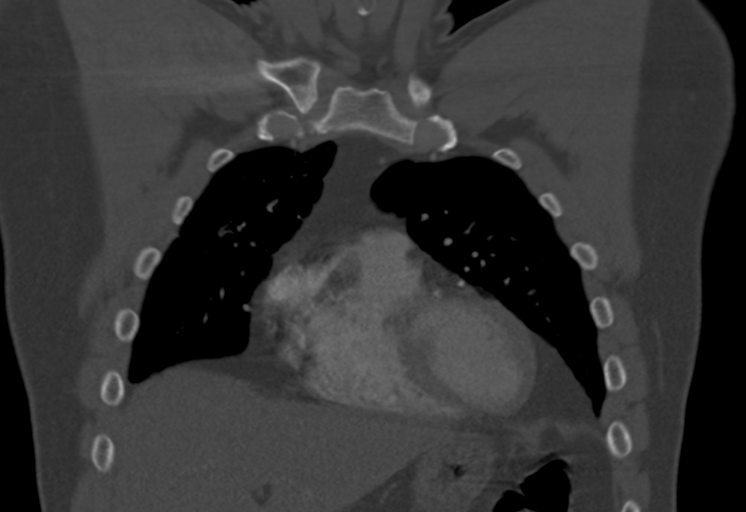
[im 95/142  soft-tissue]
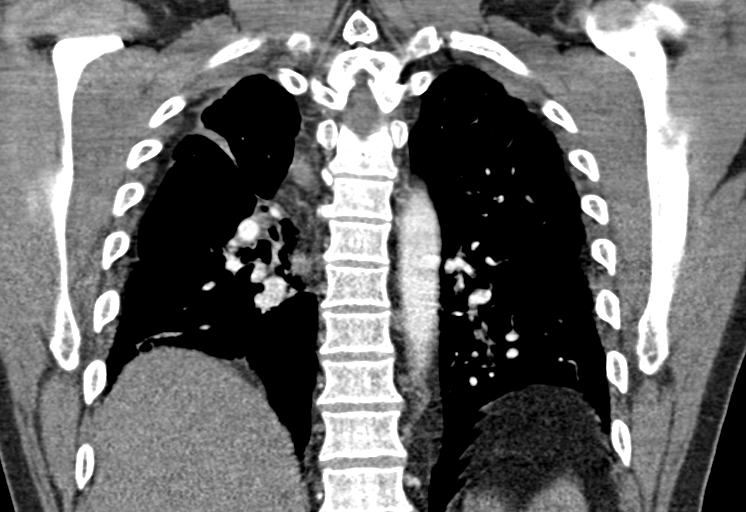

[10 of 46 positions shown; findings below may reference images not displayed]

FINDINGS: CTA CHEST FINDINGS

Cardiovascular: Satisfactory opacification of the pulmonary arteries
to the segmental level. No evidence of pulmonary embolism. Normal
heart size. No pericardial effusion. Coronary, aortic arch, and
branch vessel atherosclerotic vascular disease. Moderate stenosis of
the proximal left subclavian artery due to calcified and
noncalcified atherosclerotic plaque.

Mediastinum/Nodes: Mildly enlarged subcarinal lymph node measuring
up to 13 mm in short axis. Additional scattered subcentimeter
mediastinal and hilar lymph nodes are not enlarged by CT size
criteria. No axillary lymphadenopathy. The thyroid gland, trachea,
and esophagus are unremarkable.

Lungs/Pleura: Unchanged traction bronchiectasis and cystic change in
the right upper lobe and superior segment of the right lower lobe
with slightly increased pleural scarring and volume loss. Increased
pleural calcification posteriorly at the right lung base. Mild
atelectasis in the left lower lobe. No focal consolidation, pleural
effusion, or pneumothorax. No suspicious pulmonary nodule.

Musculoskeletal: No chest wall abnormality. No acute or significant
osseous findings.

Review of the MIP images confirms the above findings.

CT ABDOMEN and PELVIS FINDINGS

Hepatobiliary: No focal liver abnormality is seen. No gallstones,
gallbladder wall thickening, or biliary dilatation.

Pancreas: Unremarkable. No pancreatic ductal dilatation or
surrounding inflammatory changes.

Spleen: Mildly atrophic.  No focal abnormality.

Adrenals/Urinary Tract: Adrenal glands are unremarkable. Kidneys are
normal, without renal calculi, focal lesion, or hydronephrosis.
Bladder is unremarkable.

Stomach/Bowel: Stomach is within normal limits. Appendix appears
normal. No evidence of bowel wall thickening, distention, or
inflammatory changes.

Vascular/Lymphatic: Extensive aortic atherosclerosis. Moderate
stenosis of the celiac artery origin. No enlarged abdominal or
pelvic lymph nodes.

Reproductive: Prostate is unremarkable.

Other: No abdominal wall hernia or abnormality. No abdominopelvic
ascites. No pneumoperitoneum.

Musculoskeletal: No acute or significant osseous findings. Mild
degenerative changes of the lower lumbar spine

Review of the MIP images confirms the above findings.
IMPRESSION: Chest:

1. No evidence of pulmonary embolism. No acute intrathoracic
process.
2. Chronic postinflammatory/postinfectious changes in the right
upper lobe and superior segment of the right lower lobe with
slightly increased pleural scarring and volume loss.
3. Prominent mediastinal lymph nodes measuring up to 13 mm in short
axis, nonspecific, possibly reactive.
4. Aortic atherosclerosis ([id number]-LAY.Y). Moderate stenosis of the
proximal left subclavian artery.

Abdomen and pelvis:

1.  No acute intra-abdominal process.
2. Moderate stenosis of the celiac artery origin.

## 2020-01-27 ENCOUNTER — Emergency Department (HOSPITAL_COMMUNITY)
Admission: EM | Admit: 2020-01-27 | Discharge: 2020-01-27 | Disposition: A | Discharge status: Home / Self Care | Payer: No Typology Code available for payment source | Attending: Emergency Medicine | Admitting: Emergency Medicine

## 2020-01-27 ENCOUNTER — Emergency Department (HOSPITAL_COMMUNITY): Payer: No Typology Code available for payment source

## 2020-01-27 ENCOUNTER — Encounter (HOSPITAL_COMMUNITY): Payer: Self-pay | Admitting: Emergency Medicine

## 2020-01-27 ENCOUNTER — Other Ambulatory Visit: Payer: Self-pay

## 2020-01-27 DIAGNOSIS — I2510 Atherosclerotic heart disease of native coronary artery without angina pectoris: Secondary | ICD-10-CM | POA: Diagnosis not present

## 2020-01-27 DIAGNOSIS — M545 Low back pain: Secondary | ICD-10-CM | POA: Insufficient documentation

## 2020-01-27 DIAGNOSIS — Z7902 Long term (current) use of antithrombotics/antiplatelets: Secondary | ICD-10-CM | POA: Diagnosis not present

## 2020-01-27 DIAGNOSIS — Z79899 Other long term (current) drug therapy: Secondary | ICD-10-CM | POA: Diagnosis not present

## 2020-01-27 DIAGNOSIS — Z955 Presence of coronary angioplasty implant and graft: Secondary | ICD-10-CM | POA: Insufficient documentation

## 2020-01-27 DIAGNOSIS — J449 Chronic obstructive pulmonary disease, unspecified: Secondary | ICD-10-CM | POA: Diagnosis not present

## 2020-01-27 DIAGNOSIS — G8929 Other chronic pain: Secondary | ICD-10-CM | POA: Insufficient documentation

## 2020-01-27 DIAGNOSIS — Z87891 Personal history of nicotine dependence: Secondary | ICD-10-CM | POA: Diagnosis not present

## 2020-01-27 DIAGNOSIS — Z7982 Long term (current) use of aspirin: Secondary | ICD-10-CM | POA: Insufficient documentation

## 2020-01-27 LAB — BASIC METABOLIC PANEL
Anion gap: 7 (ref 5–15)
BUN: 10 mg/dL (ref 8–23)
CO2: 26 mmol/L (ref 22–32)
Calcium: 9.5 mg/dL (ref 8.9–10.3)
Chloride: 104 mmol/L (ref 98–111)
Creatinine, Ser: 1.01 mg/dL (ref 0.61–1.24)
GFR calc Af Amer: >60 mL/min (ref >60)
GFR calc non Af Amer: >60 mL/min (ref >60)
Glucose, Bld: 112 mg/dL — ABNORMAL HIGH (ref 70–99)
Potassium: 4.1 mmol/L (ref 3.5–5.1)
Sodium: 137 mmol/L (ref 135–145)

## 2020-01-27 LAB — CBC WITH DIFFERENTIAL/PLATELET
Abs Immature Granulocytes: 0.02 10*3/uL (ref 0.00–0.07)
Basophils Absolute: 0 10*3/uL (ref 0.0–0.1)
Basophils Relative: 1 %
Eosinophils Absolute: 0.4 10*3/uL (ref 0.0–0.5)
Eosinophils Relative: 4 %
HCT: 48.1 % (ref 39.0–52.0)
Hemoglobin: 14.8 g/dL (ref 13.0–17.0)
Immature Granulocytes: 0 %
Lymphocytes Relative: 43 %
Lymphs Abs: 3.8 10*3/uL (ref 0.7–4.0)
MCH: 25.8 pg — ABNORMAL LOW (ref 26.0–34.0)
MCHC: 30.8 g/dL (ref 30.0–36.0)
MCV: 83.9 fL (ref 80.0–100.0)
Monocytes Absolute: 0.8 10*3/uL (ref 0.1–1.0)
Monocytes Relative: 9 %
Neutro Abs: 3.9 10*3/uL (ref 1.7–7.7)
Neutrophils Relative %: 43 %
Platelets: 388 10*3/uL (ref 150–400)
RBC: 5.73 MIL/uL (ref 4.22–5.81)
RDW: 15 % (ref 11.5–15.5)
WBC: 8.9 10*3/uL (ref 4.0–10.5)
nRBC: 0 % (ref 0.0–0.2)

## 2020-01-27 LAB — URINALYSIS, ROUTINE W REFLEX MICROSCOPIC
Bilirubin Urine: NEGATIVE
Glucose, UA: 50 mg/dL — AB
Hgb urine dipstick: NEGATIVE
Ketones, ur: NEGATIVE mg/dL
Leukocytes,Ua: NEGATIVE
Nitrite: NEGATIVE
Protein, ur: NEGATIVE mg/dL
Specific Gravity, Urine: 1.024 (ref 1.005–1.030)
pH: 6 (ref 5.0–8.0)

## 2020-01-27 MED ORDER — CYCLOBENZAPRINE HCL 10 MG PO TABS
10.0000 mg | ORAL_TABLET | Freq: Every day | ORAL | 0 refills | Status: DC
Start: 2020-01-27 — End: 2023-04-24

## 2020-01-27 MED ORDER — KETOROLAC TROMETHAMINE 15 MG/ML IJ SOLN
15.0000 mg | Freq: Once | INTRAMUSCULAR | Status: AC
  Administered 2020-01-27: 15 mg via INTRAMUSCULAR
  Filled 2020-01-27: qty 1

## 2020-01-27 NOTE — ED Triage Notes (Signed)
Per pt, sudden onset of lower back pain 3 days ago-increasingly worse-no dysuria-no injury or trauma

## 2020-01-27 NOTE — ED Provider Notes (Signed)
Donald Leblanc DEPT Provider Note   CSN: [id number] Arrival date & time: 01/27/20  1418     History Chief Complaint  Patient presents with  . Back Pain    Donald Leblanc is a 64 y.o. male.  HPI HPI Comments: Donald Leblanc is a 64 y.o. male who presents to the Emergency Department complaining of left low back pain.  Patient states his symptoms started 3 days ago and have been progressively worsening.  He rates his current pain is 10/10.  He denies any trauma to the region.  He was walking when it began.  He has a history of chronic back pain which is also in this region but he states this is much worse.  He takes hydrocodone 3 times daily and has been taking this without any relief.  He denies consuming alcohol or any drugs.  No smoking.  No CP, SOB, fevers, chills, URI sx, dysuria, difficulty urinating, hematuria, abd pain, n/v/d/c, syncope dizziness, bowel or bladder incontinence, saddle anesthesia.     Past Medical History:  Diagnosis Date  . Back pain   . Colon polyps   . COPD (chronic obstructive pulmonary disease) ([place])   . Coronary artery disease   . Hx of substance abuse ([place])   . PVD (peripheral vascular disease) ([place])   . Seizures ([place])   . Thrombocytosis [facility])     Patient Active Problem List   Diagnosis Date Noted  . Upper GI bleeding 12/29/2017  . COPD GOLD 0 12/23/2017  . Bronchiectasis without complication ([place]) [id number]  . Pain in right ankle and joints of right foot 05/04/2017    Past Surgical History:  Procedure Laterality Date  . CORONARY ANGIOPLASTY WITH STENT PLACEMENT    . ESOPHAGOGASTRODUODENOSCOPY (EGD) WITH PROPOFOL N/A 12/30/2017   Procedure: ESOPHAGOGASTRODUODENOSCOPY (EGD) WITH PROPOFOL;  Surgeon: Jackquline Denmark, MD;  Location: [facility];  Service: Endoscopy;  Laterality: N/A;  . HAND SURGERY         Family History  Problem Relation Age of Onset  . Cancer Mother        Unknown  . Colon cancer Neg Hx   .  Pancreatic cancer Neg Hx     Social History   Tobacco Use  . Smoking status: Former Smoker    Packs/day: 1.50    Years: 20.00    Pack years: 30.00    Types: Cigarettes    Quit date: 12/23/1997    Years since quitting: 22.1  . Smokeless tobacco: Never Used  Substance Use Topics  . Alcohol use: No  . Drug use: No    Home Medications Prior to Admission medications   Medication Sig Start Date End Date Taking? Authorizing Provider  albuterol (PROVENTIL HFA;VENTOLIN HFA) 108 (90 Base) MCG/ACT inhaler Inhale 1-2 puffs into the lungs every 6 (six) hours as needed for wheezing or shortness of breath. 12/15/17   Pisciotta, Elmyra Ricks, PA-C  amLODipine (NORVASC) 10 MG tablet Take 10 mg by mouth daily.    [provider]  aspirin EC 81 MG tablet Take 81 mg by mouth daily.    [provider]  atorvastatin (LIPITOR) 40 MG tablet Take 1 tablet (40 mg total) by mouth daily at 6 PM. 01/01/18   Emokpae, Courage, MD  benzonatate (TESSALON) 100 MG capsule Take 1 capsule (100 mg total) by mouth every 8 (eight) hours. Patient taking differently: Take 100 mg by mouth 3 (three) times daily as needed for cough.  11/30/15   Kirichenko, Lahoma Rocker, PA-C  budesonide-formoterol (  SYMBICORT) 160-4.5 MCG/ACT inhaler Inhale 2 puffs into the lungs 2 (two) times daily. 01/27/18   Tanda Rockers, MD  budesonide-formoterol (SYMBICORT) 80-4.5 MCG/ACT inhaler Inhale 2 puffs into the lungs 2 (two) times daily. 12/23/17   Tanda Rockers, MD  clopidogrel (PLAVIX) 75 MG tablet Take 75 mg by mouth daily.    [provider]  HYDROcodone-acetaminophen (NORCO/VICODIN) 5-325 MG tablet Take 1-2 tablets by mouth every 6 hours as needed for cough. 12/15/17   Pisciotta, Elmyra Ricks, PA-C  isosorbide mononitrate (IMDUR) 60 MG 24 hr tablet Take 60 mg by mouth daily.    [provider]  metoprolol (LOPRESSOR) 50 MG tablet Take 50 mg by mouth 2 (two) times daily.    [provider]  pantoprazole (PROTONIX) 40 MG  tablet Take 1 tablet (40 mg total) by mouth daily. 01/02/18   Roxan Hockey, MD    Allergies    Patient has no known allergies.  Review of Systems   Review of Systems  All other systems reviewed and are negative. Ten systems reviewed and are negative for acute change, except as noted in the HPI.   Physical Exam Updated Vital Signs BP 117/67 (BP Location: Left Arm)   Pulse 70   Temp (!) 97.5 F (36.4 C) (Oral)   Resp 18   SpO2 97%   Physical Exam Vitals and nursing note reviewed.  Constitutional:      General: He is not in acute distress.    Appearance: Normal appearance. He is not ill-appearing, toxic-appearing or diaphoretic.  HENT:     Head: Normocephalic and atraumatic.     Right Ear: External ear normal.     Left Ear: External ear normal.     Nose: Nose normal.     Mouth/Throat:     Mouth: Mucous membranes are moist.     Pharynx: Oropharynx is clear. No oropharyngeal exudate or posterior oropharyngeal erythema.  Eyes:     General: No scleral icterus.       Right eye: No discharge.        Left eye: No discharge.     Extraocular Movements: Extraocular movements intact.     Conjunctiva/sclera: Conjunctivae normal.     Pupils: Pupils are equal, round, and reactive to light.  Neck:     Comments: No midline C, T, L-spine tenderness. Cardiovascular:     Rate and Rhythm: Normal rate and regular rhythm.     Pulses: Normal pulses.     Heart sounds: Normal heart sounds. No murmur. No friction rub. No gallop.   Pulmonary:     Effort: Pulmonary effort is normal. No respiratory distress.     Breath sounds: Normal breath sounds. No stridor. No wheezing, rhonchi or rales.  Abdominal:     General: Abdomen is flat.     Palpations: Abdomen is soft.     Tenderness: There is no abdominal tenderness. There is no right CVA tenderness, left CVA tenderness, guarding or rebound.  Musculoskeletal:        General: Tenderness present. No swelling, deformity or signs of injury. Normal  range of motion.     Cervical back: Normal range of motion and neck supple. No tenderness.     Right lower leg: No edema.     Left lower leg: No edema.     Comments: Moderate pain along the left lateral lumbar region with palpation.  No overlying signs of trauma.  Skin:    General: Skin is warm and dry.  Neurological:  General: No focal deficit present.     Mental Status: He is alert and oriented to person, place, and time.     Comments: Distal sensation intact in the bilateral lower extremities.  2+ DTRs noted bilaterally.  Strength is 5 out of 5 in the bilateral lower extremities.  Psychiatric:        Mood and Affect: Mood normal.        Behavior: Behavior normal.    ED Results / Procedures / Treatments   Labs (all labs ordered are listed, but only abnormal results are displayed) Labs Reviewed  BASIC METABOLIC PANEL - Abnormal; Notable for the following components:      Result Value   Glucose, Bld 112 (*)    All other components within normal limits  CBC WITH DIFFERENTIAL/PLATELET - Abnormal; Notable for the following components:   MCH 25.8 (*)    All other components within normal limits  URINALYSIS, ROUTINE W REFLEX MICROSCOPIC   EKG None  Radiology CT Renal Stone Study  Result Date: 01/27/2020 CLINICAL DATA:  Back pain. EXAM: CT ABDOMEN AND PELVIS WITHOUT CONTRAST TECHNIQUE: Multidetector CT imaging of the abdomen and pelvis was performed following the standard protocol without IV contrast. COMPARISON:  January 14, 2018. FINDINGS: Lower chest: There is stable scarring atelectasis at the right lung base with associated calcified pleural based plaques.The heart size is normal. Hepatobiliary: The liver is normal. Normal gallbladder.There is no biliary ductal dilation. Pancreas: Normal contours without ductal dilatation. No peripancreatic fluid collection. Spleen: Unremarkable. Adrenals/Urinary Tract: --Adrenal glands: Unremarkable. --Right kidney/ureter: No hydronephrosis or  radiopaque kidney stones. --Left kidney/ureter: No hydronephrosis or radiopaque kidney stones. --Urinary bladder: Unremarkable. Stomach/Bowel: --Stomach/Duodenum: No hiatal hernia or other gastric abnormality. Normal duodenal course and caliber. --Small bowel: Unremarkable. --Colon: Unremarkable. --Appendix: Normal. Vascular/Lymphatic: Atherosclerotic calcification is present within the non-aneurysmal abdominal aorta, without hemodynamically significant stenosis. --No retroperitoneal lymphadenopathy. --No mesenteric lymphadenopathy. --No pelvic or inguinal lymphadenopathy. Reproductive: Unremarkable Other: No ascites or free air. The abdominal wall is normal. Musculoskeletal. No acute displaced fractures. IMPRESSION: No acute abdominopelvic abnormality. Aortic Atherosclerosis (ICD10-I70.0). Electronically Signed   By: Constance Holster M.D.   On: 01/27/2020 16:00   Procedures Procedures   Medications Ordered in ED Medications  ketorolac (TORADOL) 15 MG/ML injection 15 mg (has no administration in time range)   ED Course  I have reviewed the triage vital signs and the nursing notes.  Pertinent labs & imaging results that were available during my care of the patient were reviewed by me and considered in my medical decision making (see chart for details).    MDM Rules/Calculators/A&P                      3:20 PM patient is a pleasant 65 year old male with chronic pain.  He has been experiencing worsening pain in the left lumbar region.  This is worse than his chronic pain.  He has been taking his regular hydrocodone 3 times daily without relief.  He drove himself to the emergency department.  He denies any other symptoms at this time.  No symptoms concerning for cauda equina.  Negative straight leg raise and contralateral straight leg raise.  Exam nonconcerning for sciatica.  Physical exam is reassuring.  Vital signs are stable.  No tachycardia.  Afebrile.  Will obtain basic labs, UA, CT renal stone  study.  Will reassess.  4:05 PM CT is negative for any acute abnormalities.  CBC and BMP are reassuring.  UA is pending.  Creatinine is not elevated, so I will give Toradol IM for pain.  5:57 PM his pain was mildly alleviated with Toradol.  UA resulted and is reassuring.  I discussed this with the patient and his wife.  I am going to prescribe him a short course of Flexeril.  He understands this medication has a sedating effect.  He understands not to mix with alcohol.  He also understands not to operate a motor vehicle after taking it.  I recommended that he follow-up with his primary care provider on Monday to discuss this visit as well as his symptoms.  He understands he can return to the emergency department any new or worsening symptoms.  His questions were answered and he was amicable at the time of discharge.  His vital signs are stable.  Patient discharged to home/self care.  Condition at discharge: Stable  Note: Portions of this report may have been transcribed using voice recognition software. Every effort was made to ensure accuracy; however, inadvertent computerized transcription errors may be present.    Final Clinical Impression(s) / ED Diagnoses Final diagnoses:  Chronic left-sided low back pain without sciatica   Rx / DC Orders ED Discharge Orders         Ordered    cyclobenzaprine (FLEXERIL) 10 MG tablet  Daily at bedtime     01/27/20 1755           Rayna Sexton, PA-C 01/27/20 1800    Long, Wonda Olds, MD 01/28/20 [phone]

## 2020-01-27 NOTE — Discharge Instructions (Addendum)
Per our discussion, I am prescribing you a muscle relaxant called Flexeril.  You can take this once at night as needed for management of your pain as well as difficulty sleeping.  Please do not mix this medicine with alcohol.  Please do not drive a motor vehicle after taking this medication.  You can continue to take your prescribed hydrocodone for chronic pain.  Please follow-up with your primary care provider in 2 days if your symptoms have not alleviated.  Please return to the emergency department if your symptoms worsen.  It was a pleasure to meet you.

## 2022-10-07 ENCOUNTER — Encounter: Payer: Self-pay | Admitting: Internal Medicine

## 2023-01-27 ENCOUNTER — Encounter (HOSPITAL_COMMUNITY): Payer: Self-pay

## 2023-01-27 ENCOUNTER — Telehealth (HOSPITAL_COMMUNITY): Payer: Self-pay

## 2023-01-27 NOTE — Telephone Encounter (Signed)
Outside/paper referral received by Dr Justice Britain from [facility]. Will fax over Physician order and request further documents. Insurance benefits and eligibility to be determined.

## 2023-01-27 NOTE — Telephone Encounter (Signed)
Attempted to call patient in regards to Cardiac Rehab - LM on VM Mailed letter 

## 2023-01-28 ENCOUNTER — Telehealth (HOSPITAL_COMMUNITY): Payer: Self-pay

## 2023-01-28 NOTE — Telephone Encounter (Signed)
Pt returned CR phone call and stated he is not interested in CR.   Closed referral 

## 2023-04-24 ENCOUNTER — Inpatient Hospital Stay (HOSPITAL_COMMUNITY)
Admission: EM | Admit: 2023-04-24 | Discharge: 2023-04-28 | DRG: 286 | Disposition: A | Discharge status: Home / Self Care | Payer: No Typology Code available for payment source | Attending: Family Medicine | Admitting: Family Medicine

## 2023-04-24 ENCOUNTER — Encounter (HOSPITAL_COMMUNITY): Payer: Self-pay | Admitting: *Deleted

## 2023-04-24 ENCOUNTER — Emergency Department (HOSPITAL_COMMUNITY): Payer: No Typology Code available for payment source

## 2023-04-24 ENCOUNTER — Other Ambulatory Visit: Payer: Self-pay

## 2023-04-24 ENCOUNTER — Observation Stay (HOSPITAL_COMMUNITY): Payer: No Typology Code available for payment source

## 2023-04-24 DIAGNOSIS — I340 Nonrheumatic mitral (valve) insufficiency: Secondary | ICD-10-CM | POA: Diagnosis not present

## 2023-04-24 DIAGNOSIS — I739 Peripheral vascular disease, unspecified: Secondary | ICD-10-CM | POA: Diagnosis present

## 2023-04-24 DIAGNOSIS — Z79899 Other long term (current) drug therapy: Secondary | ICD-10-CM

## 2023-04-24 DIAGNOSIS — R001 Bradycardia, unspecified: Secondary | ICD-10-CM | POA: Diagnosis present

## 2023-04-24 DIAGNOSIS — N179 Acute kidney failure, unspecified: Secondary | ICD-10-CM | POA: Diagnosis present

## 2023-04-24 DIAGNOSIS — J449 Chronic obstructive pulmonary disease, unspecified: Secondary | ICD-10-CM | POA: Diagnosis present

## 2023-04-24 DIAGNOSIS — Z87891 Personal history of nicotine dependence: Secondary | ICD-10-CM

## 2023-04-24 DIAGNOSIS — I110 Hypertensive heart disease with heart failure: Secondary | ICD-10-CM | POA: Diagnosis not present

## 2023-04-24 DIAGNOSIS — R55 Syncope and collapse: Secondary | ICD-10-CM | POA: Diagnosis not present

## 2023-04-24 DIAGNOSIS — R42 Dizziness and giddiness: Secondary | ICD-10-CM | POA: Diagnosis not present

## 2023-04-24 DIAGNOSIS — I10 Essential (primary) hypertension: Secondary | ICD-10-CM | POA: Diagnosis not present

## 2023-04-24 DIAGNOSIS — I2582 Chronic total occlusion of coronary artery: Secondary | ICD-10-CM | POA: Diagnosis present

## 2023-04-24 DIAGNOSIS — Z809 Family history of malignant neoplasm, unspecified: Secondary | ICD-10-CM

## 2023-04-24 DIAGNOSIS — Z951 Presence of aortocoronary bypass graft: Secondary | ICD-10-CM

## 2023-04-24 DIAGNOSIS — I255 Ischemic cardiomyopathy: Secondary | ICD-10-CM | POA: Diagnosis present

## 2023-04-24 DIAGNOSIS — I2510 Atherosclerotic heart disease of native coronary artery without angina pectoris: Secondary | ICD-10-CM

## 2023-04-24 DIAGNOSIS — I5021 Acute systolic (congestive) heart failure: Secondary | ICD-10-CM | POA: Diagnosis present

## 2023-04-24 DIAGNOSIS — I441 Atrioventricular block, second degree: Secondary | ICD-10-CM | POA: Diagnosis present

## 2023-04-24 DIAGNOSIS — E7800 Pure hypercholesterolemia, unspecified: Secondary | ICD-10-CM | POA: Diagnosis present

## 2023-04-24 DIAGNOSIS — R9389 Abnormal findings on diagnostic imaging of other specified body structures: Secondary | ICD-10-CM

## 2023-04-24 DIAGNOSIS — I5023 Acute on chronic systolic (congestive) heart failure: Secondary | ICD-10-CM | POA: Diagnosis present

## 2023-04-24 DIAGNOSIS — Z955 Presence of coronary angioplasty implant and graft: Secondary | ICD-10-CM

## 2023-04-24 DIAGNOSIS — Z7902 Long term (current) use of antithrombotics/antiplatelets: Secondary | ICD-10-CM

## 2023-04-24 LAB — BASIC METABOLIC PANEL
Anion gap: 10 (ref 5–15)
BUN: 7 mg/dL — ABNORMAL LOW (ref 8–23)
CO2: 23 mmol/L (ref 22–32)
Calcium: 8.8 mg/dL — ABNORMAL LOW (ref 8.9–10.3)
Chloride: 106 mmol/L (ref 98–111)
Creatinine, Ser: 1.11 mg/dL (ref 0.61–1.24)
GFR, Estimated: >60 mL/min (ref >60)
Glucose, Bld: 106 mg/dL — ABNORMAL HIGH (ref 70–99)
Potassium: 3.9 mmol/L (ref 3.5–5.1)
Sodium: 139 mmol/L (ref 135–145)

## 2023-04-24 LAB — URINALYSIS, ROUTINE W REFLEX MICROSCOPIC
Bilirubin Urine: NEGATIVE
Glucose, UA: NEGATIVE mg/dL
Hgb urine dipstick: NEGATIVE
Ketones, ur: NEGATIVE mg/dL
Leukocytes,Ua: NEGATIVE
Nitrite: NEGATIVE
Protein, ur: 100 mg/dL — AB
Specific Gravity, Urine: 1.026 (ref 1.005–1.030)
pH: 5 (ref 5.0–8.0)

## 2023-04-24 LAB — CBC
HCT: 37.8 % — ABNORMAL LOW (ref 39.0–52.0)
HCT: 40.6 % (ref 39.0–52.0)
Hemoglobin: 11.4 g/dL — ABNORMAL LOW (ref 13.0–17.0)
Hemoglobin: 12.4 g/dL — ABNORMAL LOW (ref 13.0–17.0)
MCH: 24.6 pg — ABNORMAL LOW (ref 26.0–34.0)
MCH: 24.9 pg — ABNORMAL LOW (ref 26.0–34.0)
MCHC: 30.2 g/dL (ref 30.0–36.0)
MCHC: 30.5 g/dL (ref 30.0–36.0)
MCV: 81.5 fL (ref 80.0–100.0)
MCV: 81.7 fL (ref 80.0–100.0)
Platelets: 374 10*3/uL (ref 150–400)
Platelets: 400 10*3/uL (ref 150–400)
RBC: 4.64 MIL/uL (ref 4.22–5.81)
RBC: 4.97 MIL/uL (ref 4.22–5.81)
RDW: 17.1 % — ABNORMAL HIGH (ref 11.5–15.5)
RDW: 17 % — ABNORMAL HIGH (ref 11.5–15.5)
WBC: 6.1 10*3/uL (ref 4.0–10.5)
WBC: 7.8 10*3/uL (ref 4.0–10.5)
nRBC: 0 % (ref 0.0–0.2)
nRBC: 0 % (ref 0.0–0.2)

## 2023-04-24 LAB — HEMOGLOBIN A1C
Hgb A1c MFr Bld: 6.3 % — ABNORMAL HIGH (ref 4.8–5.6)
Mean Plasma Glucose: 134.11 mg/dL

## 2023-04-24 LAB — ECHOCARDIOGRAM COMPLETE
Area-P 1/2: 7.44 cm2
Calc EF: 33.9 %
Height: 70.5 in
MV M vel: 4.77 m/s
MV Peak grad: 91 mmHg
S' Lateral: 4.4 cm
Single Plane A2C EF: 37 %
Single Plane A4C EF: 27.6 %
Weight: 2736 oz

## 2023-04-24 LAB — CREATININE, SERUM
Creatinine, Ser: 1.03 mg/dL (ref 0.61–1.24)
GFR, Estimated: >60 mL/min (ref >60)

## 2023-04-24 LAB — GLUCOSE, CAPILLARY
Glucose-Capillary: 86 mg/dL (ref 70–99)
Glucose-Capillary: 126 mg/dL — ABNORMAL HIGH (ref 70–99)

## 2023-04-24 LAB — CBG MONITORING, ED: Glucose-Capillary: 103 mg/dL — ABNORMAL HIGH (ref 70–99)

## 2023-04-24 LAB — D-DIMER, QUANTITATIVE: D-Dimer, Quant: 0.68 ug/mL-FEU — ABNORMAL HIGH (ref 0.00–0.50)

## 2023-04-24 LAB — HIV ANTIBODY (ROUTINE TESTING W REFLEX): HIV Screen 4th Generation wRfx: NONREACTIVE

## 2023-04-24 LAB — BRAIN NATRIURETIC PEPTIDE: B Natriuretic Peptide: 426.2 pg/mL — ABNORMAL HIGH (ref 0.0–100.0)

## 2023-04-24 LAB — TROPONIN I (HIGH SENSITIVITY)
Troponin I (High Sensitivity): 17 ng/L (ref <18)
Troponin I (High Sensitivity): 18 ng/L — ABNORMAL HIGH (ref <18)

## 2023-04-24 MED ORDER — POLYETHYLENE GLYCOL 3350 17 G PO PACK: 17.0000 g | PACK | Freq: Every day | ORAL | Status: DC | PRN

## 2023-04-24 MED ORDER — ALBUTEROL SULFATE (2.5 MG/3ML) 0.083% IN NEBU: 3.0000 mL | INHALATION_SOLUTION | Freq: Four times a day (QID) | RESPIRATORY_TRACT | Status: DC | PRN

## 2023-04-24 MED ORDER — ACETAMINOPHEN 650 MG RE SUPP: 650.0000 mg | Freq: Four times a day (QID) | RECTAL | Status: DC | PRN

## 2023-04-24 MED ORDER — ASPIRIN 81 MG PO CHEW
81.0000 mg | CHEWABLE_TABLET | Freq: Every day | ORAL | Status: DC
  Administered 2023-04-25 – 2023-04-28 (×4): 81 mg via ORAL
  Filled 2023-04-24 (×4): qty 1

## 2023-04-24 MED ORDER — FUROSEMIDE 10 MG/ML IJ SOLN
20.0000 mg | Freq: Once | INTRAMUSCULAR | Status: AC
  Administered 2023-04-24: 20 mg via INTRAVENOUS
  Filled 2023-04-24: qty 2

## 2023-04-24 MED ORDER — INSULIN ASPART 100 UNIT/ML IJ SOLN: 0.0000 [IU] | Freq: Every day | INTRAMUSCULAR | Status: DC

## 2023-04-24 MED ORDER — ATORVASTATIN CALCIUM 40 MG PO TABS
40.0000 mg | ORAL_TABLET | Freq: Every day | ORAL | Status: DC
  Administered 2023-04-25 – 2023-04-28 (×4): 40 mg via ORAL
  Filled 2023-04-24 (×4): qty 1

## 2023-04-24 MED ORDER — HYDROCODONE-ACETAMINOPHEN 10-325 MG PO TABS
1.0000 | ORAL_TABLET | ORAL | Status: DC | PRN
  Administered 2023-04-24 – 2023-04-28 (×11): 1 via ORAL
  Filled 2023-04-24 (×11): qty 1

## 2023-04-24 MED ORDER — INSULIN ASPART 100 UNIT/ML IJ SOLN
0.0000 [IU] | Freq: Three times a day (TID) | INTRAMUSCULAR | Status: DC
  Administered 2023-04-27 (×3): 2 [IU] via SUBCUTANEOUS

## 2023-04-24 MED ORDER — ACETAMINOPHEN 325 MG PO TABS: 650.0000 mg | ORAL_TABLET | Freq: Four times a day (QID) | ORAL | Status: DC | PRN

## 2023-04-24 MED ORDER — LIDOCAINE 5 % EX PTCH
1.0000 | MEDICATED_PATCH | Freq: Once | CUTANEOUS | Status: AC
  Administered 2023-04-24: 1 via TRANSDERMAL
  Filled 2023-04-24: qty 1

## 2023-04-24 MED ORDER — ISOSORBIDE MONONITRATE ER 30 MG PO TB24
30.0000 mg | ORAL_TABLET | Freq: Every day | ORAL | Status: DC
  Administered 2023-04-25 – 2023-04-28 (×4): 30 mg via ORAL
  Filled 2023-04-24 (×4): qty 1

## 2023-04-24 MED ORDER — METOPROLOL TARTRATE 12.5 MG HALF TABLET
12.5000 mg | ORAL_TABLET | Freq: Two times a day (BID) | ORAL | Status: DC
  Administered 2023-04-24: 12.5 mg via ORAL
  Filled 2023-04-24: qty 1

## 2023-04-24 MED ORDER — ENOXAPARIN SODIUM 40 MG/0.4ML IJ SOSY
40.0000 mg | PREFILLED_SYRINGE | INTRAMUSCULAR | Status: DC
  Administered 2023-04-25 – 2023-04-28 (×4): 40 mg via SUBCUTANEOUS
  Filled 2023-04-24 (×4): qty 0.4

## 2023-04-24 MED ORDER — PERFLUTREN LIPID MICROSPHERE
1.0000 mL | INTRAVENOUS | Status: AC | PRN
  Administered 2023-04-24: 3 mL via INTRAVENOUS

## 2023-04-24 MED ORDER — SODIUM CHLORIDE 0.9% FLUSH
3.0000 mL | Freq: Two times a day (BID) | INTRAVENOUS | Status: DC
  Administered 2023-04-24 – 2023-04-27 (×6): 3 mL via INTRAVENOUS

## 2023-04-24 MED ORDER — ACETAMINOPHEN 500 MG PO TABS
1000.0000 mg | ORAL_TABLET | Freq: Once | ORAL | Status: AC
  Administered 2023-04-24: 1000 mg via ORAL
  Filled 2023-04-24: qty 2

## 2023-04-24 MED ORDER — CLOPIDOGREL BISULFATE 75 MG PO TABS
75.0000 mg | ORAL_TABLET | Freq: Every day | ORAL | Status: DC
  Administered 2023-04-25 – 2023-04-28 (×4): 75 mg via ORAL
  Filled 2023-04-24 (×4): qty 1

## 2023-04-24 NOTE — ED Triage Notes (Signed)
Patient presents to ed via [facility] states he was out working in his  yard and his neighbor saw hime lean against a trash can, went to speak to him and states he had a near syncopal episode did not fall. Pain denies chest pain c/o sob of which he always is. 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

## 2023-04-24 NOTE — ED Notes (Signed)
ED TO INPATIENT HANDOFF REPORT  ED Nurse Name and Phone #: Clorinda Wyble [phone]  S Name/Age/Gender Donald Leblanc 67 y.o. male Room/Bed: 038C/038C  Code Status   Code Status: Full Code  Home/SNF/Other Home Patient oriented to: self, place, time, and situation Is this baseline? Yes   Triage Complete: Triage complete  Chief Complaint Near syncope [R55]  Triage Note Patient presents to ed via [facility] states he was out working in his  yard and his neighbor saw hime lean against a trash can, went to speak to him and states he had a near syncopal episode did not fall. Pain denies chest pain c/o sob of which he always is. 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   Allergies No Known Allergies  Level of Care/Admitting Diagnosis ED Disposition     ED Disposition  Admit   Condition  --   Comment  Hospital Area: [facility] [[id number]]  Level of Care: Telemetry Cardiac [103]  May place patient in observation at [facility] or [facility] if equivalent level of care is available:: No  Covid Evaluation: Asymptomatic - no recent exposure (last 10 days) testing not required  Diagnosis: Near syncope [phone]  Admitting Physician: Nolberto Hanlon [[id number]]  Attending Physician: Nolberto Hanlon [[id number]]          B Medical/Surgery History Past Medical History:  Diagnosis Date   Back pain    Colon polyps    COPD (chronic obstructive pulmonary disease) ([facility])    Coronary artery disease    Hx of substance abuse ([facility])    PVD (peripheral vascular disease) ([facility])    Seizures ([facility])    Thrombocytosis    Past Surgical History:  Procedure Laterality Date   CORONARY ANGIOPLASTY WITH STENT PLACEMENT     ESOPHAGOGASTRODUODENOSCOPY (EGD) WITH PROPOFOL N/A 12/30/2017   Procedure: ESOPHAGOGASTRODUODENOSCOPY (EGD) WITH PROPOFOL;  Surgeon: Lynann Bologna, MD;  Location[facility];  Service: Endoscopy;  Laterality: N/A;   HAND SURGERY       A IV Location/Drains/Wounds Patient  Lines/Drains/Airways Status     Active Line/Drains/Airways     Name Placement date Placement time Site Days   Peripheral IV 04/24/23 20 G Right Antecubital 04/24/23  1025  Antecubital  less than 1            Intake/Output Last 24 hours No intake or output data in the 24 hours ending 04/24/23 1558  Labs/Imaging Results for orders placed or performed during the hospital encounter of 04/24/23 (from the past 48 hour(s))  CBG monitoring, ED     Status: Abnormal   Collection Time: 04/24/23 10:09 AM  Result Value Ref Range   Glucose-Capillary 103 (H) 70 - 99 mg/dL    Comment: Glucose reference range applies only to samples taken after fasting for at least 8 hours.   Comment 1 Notify RN    Comment 2 Document in Chart   Basic metabolic panel     Status: Abnormal   Collection Time: 04/24/23 10:27 AM  Result Value Ref Range   Sodium 139 135 - 145 mmol/L   Potassium 3.9 3.5 - 5.1 mmol/L   Chloride 106 98 - 111 mmol/L   CO2 23 22 - 32 mmol/L   Glucose, Bld 106 (H) 70 - 99 mg/dL    Comment: Glucose reference range applies only to samples taken after fasting for at least 8 hours.   BUN 7 (L) 8 - 23 mg/dL   Creatinine, Ser 2.53 0.61 - 1.24 mg/dL   Calcium 8.8 (L)  8.9 - 10.3 mg/dL   GFR, Estimated >78 >29 mL/min    Comment: (NOTE) Calculated using the CKD-EPI Creatinine Equation (2021)    Anion gap 10 5 - 15    Comment: Performed at [facility], [address] [place], [place] [address]  CBC     Status: Abnormal   Collection Time: 04/24/23 10:27 AM  Result Value Ref Range   WBC 6.1 4.0 - 10.5 K/uL   RBC 4.64 4.22 - 5.81 MIL/uL   Hemoglobin 11.4 (L) 13.0 - 17.0 g/dL   HCT 08.6 (L) 57.8 - 46.9 %   MCV 81.5 80.0 - 100.0 fL   MCH 24.6 (L) 26.0 - 34.0 pg   MCHC 30.2 30.0 - 36.0 g/dL   RDW 62.9 (H) 52.8 - 41.3 %   Platelets 374 150 - 400 K/uL   nRBC 0.0 0.0 - 0.2 %    Comment: Performed at [facility], [address], [place], [place] [address]  Troponin I (High  Sensitivity)     Status: None   Collection Time: 04/24/23 10:27 AM  Result Value Ref Range   Troponin I (High Sensitivity) 17 <18 ng/L    Comment: (NOTE) Elevated high sensitivity troponin I (hsTnI) values and significant  changes across serial measurements may suggest ACS but many other  chronic and acute conditions are known to elevate hsTnI results.  Refer to the "Links" section for chest pain algorithms and additional  guidance. Performed at [facility], [address], [place], [place] [address]   Brain natriuretic peptide     Status: Abnormal   Collection Time: 04/24/23 10:27 AM  Result Value Ref Range   B Natriuretic Peptide 426.2 (H) 0.0 - 100.0 pg/mL    Comment: Performed at [facility], [address], [place], [place] [address]  Troponin I (High Sensitivity)     Status: Abnormal   Collection Time: 04/24/23 11:55 AM  Result Value Ref Range   Troponin I (High Sensitivity) 18 (H) <18 ng/L    Comment: (NOTE) Elevated high sensitivity troponin I (hsTnI) values and significant  changes across serial measurements may suggest ACS but many other  chronic and acute conditions are known to elevate hsTnI results.  Refer to the "Links" section for chest pain algorithms and additional  guidance. Performed at [facility], [address] [place], [place] [address]   Urinalysis, Routine w reflex microscopic -Urine, Clean Catch     Status: Abnormal   Collection Time: 04/24/23  1:07 PM  Result Value Ref Range   Color, Urine YELLOW YELLOW   APPearance CLEAR CLEAR   Specific Gravity, Urine 1.026 1.005 - 1.030   pH 5.0 5.0 - 8.0   Glucose, UA NEGATIVE NEGATIVE mg/dL   Hgb urine dipstick NEGATIVE NEGATIVE   Bilirubin Urine NEGATIVE NEGATIVE   Ketones, ur NEGATIVE NEGATIVE mg/dL   Protein, ur 259 (A) NEGATIVE mg/dL   Nitrite NEGATIVE NEGATIVE   Leukocytes,Ua NEGATIVE NEGATIVE   RBC / HPF 0-5 0 - 5 RBC/hpf   WBC, UA 0-5 0 - 5 WBC/hpf   Bacteria, UA RARE (A) NONE  SEEN   Squamous Epithelial / HPF 0-5 0 - 5 /HPF   Mucus PRESENT    Hyaline Casts, UA PRESENT     Comment: Performed at [facility], [address] [place], [place] [address]   DG Chest 1 View  Result Date: 04/24/2023 CLINICAL DATA:  SOB EXAM: CHEST  1  VIEW COMPARISON:  CXR 01/14/18 FINDINGS: Status post median sternotomy and CABG. Cardiomegaly.No pleural effusion. No pneumothorax. Unchanged linear opacity in the right upper lobe. There is a new hazy opacity at the right lung base, which could represent atelectasis or infection. No radiographically apparent displaced rib fractures. Visualized upper abdomen is unremarkable. IMPRESSION: 1. New hazy opacity at the right lung base, which could represent atelectasis or infection. 2. Cardiomegaly. If there is clinical concern for a pericardial effusion, further evaluation with echocardiography is recommended. Electronically Signed   By: Lorenza Cambridge M.D.   On: 04/24/2023 11:14    Pending Labs Unresulted Labs (From admission, onward)     Start     Ordered   05/01/23 0500  Creatinine, serum  (enoxaparin (LOVENOX)    CrCl >/= 30 ml/min)  Weekly,   R     Comments: while on enoxaparin therapy    04/24/23 1318   04/25/23 0500  APTT  Tomorrow morning,   R        04/24/23 1318   04/25/23 0500  Protime-INR  Tomorrow morning,   R        04/24/23 1318   04/24/23 1318  CBC  (enoxaparin (LOVENOX)    CrCl >/= 30 ml/min)  Once,   R       Comments: Baseline for enoxaparin therapy IF NOT ALREADY DRAWN.  Notify MD if PLT < 100 K.    04/24/23 1318   04/24/23 1318  Creatinine, serum  (enoxaparin (LOVENOX)    CrCl >/= 30 ml/min)  Once,   R       Comments: Baseline for enoxaparin therapy IF NOT ALREADY DRAWN.    04/24/23 1318   04/24/23 1318  HIV Antibody (routine testing w rflx)  (HIV Antibody (Routine testing w reflex) panel)  Once,   R        04/24/23 1318            Vitals/Pain Today's Vitals   04/24/23 1100 04/24/23 1134 04/24/23 1302  04/24/23 1453  BP: (!) 154/94     Pulse: 67     Resp: 20     Temp:    (!) 97.5 F (36.4 C)  TempSrc:      SpO2: 94%     Weight:      Height:      PainSc:  Asleep 6      Isolation Precautions No active isolations  Medications Medications  lidocaine (LIDODERM) 5 % 1-3 patch (1 patch Transdermal Patch Applied 04/24/23 1506)  enoxaparin (LOVENOX) injection 40 mg (has no administration in time range)  acetaminophen (TYLENOL) tablet 650 mg (has no administration in time range)    Or  acetaminophen (TYLENOL) suppository 650 mg (has no administration in time range)  polyethylene glycol (MIRALAX / GLYCOLAX) packet 17 g (has no administration in time range)  sodium chloride flush (NS) 0.9 % injection 3 mL (3 mLs Intravenous Given 04/24/23 1506)  furosemide (LASIX) injection 20 mg (20 mg Intravenous Given 04/24/23 1305)  acetaminophen (TYLENOL) tablet 1,000 mg (1,000 mg Oral Given 04/24/23 1506)    Mobility walks     Focused Assessments    R Recommendations: See Admitting Provider Note  Report given to:   Additional Notes:

## 2023-04-24 NOTE — Assessment & Plan Note (Signed)
Initial workup including troponin and EKG are reassuring.  We will do ambulatory pulse oximetry given patient is having exertional shortness of breath.  We will monitor patient's glucose, make sure will be monitored on telemetry.  Echo is pending, with note made of initial ER POCUS concerning for reduced EF.  D-dimer pending

## 2023-04-24 NOTE — ED Notes (Signed)
RN provided pt a urinal

## 2023-04-24 NOTE — Progress Notes (Signed)
*  PRELIMINARY RESULTS* Echocardiogram 2D Echocardiogram has been performed with Definity.  Stacey Drain 04/24/2023, 5:03 PM

## 2023-04-24 NOTE — H&P (Signed)
History and Physical    Patient: Donald Leblanc ZOX:[id number] DOB: Jun 21, 1956 DOA: 04/24/2023 DOS: the patient was seen and examined on 04/24/2023 PCP: Sondra Come, MD  Patient coming from: Home  Chief Complaint:  Chief Complaint  Patient presents with   Near Syncope   HPI: Donald Leblanc is a 67 y.o. male with medical history significant of patient has had  (per Patient) chest pain with exertion since before April when he had coronary artery bypass grafting done for same.  Patient states that since discharge for his coronary artery bypass grafting in April 2024 he has had exertional shortness of breath.  It is sometimes caused him to have to stop while climbing stairs.  Patient does not report any cough or chest pain or any leg swelling or any fever or palpitations.  Patient last had his meal at approximately 6 PM yesterday evening and he did not have any breakfast this morning.  Patient was in his garden at approximately 30 AM doing some weeding when he states that he started feeling sensation of shortness of breath and lightheadedness which made it necessitated him to sit down on the steps.  Again he did not have any chest pain fever or cough or any leg swelling.  The sensation lasted a minute and then patient was feeling okay and able to get back up.  However there was a neighbor who noted above episode and suggested that he come to the ER.  Patient is therefore brought to [facility] ER, patient is totally asymptomatic at this time.  ER workup shows a vitally stable patient with the largely negative workup but a POCUS ultrasound is positive for reduced EF. Review of Systems: As mentioned in the history of present illness. All other systems reviewed and are negative. Past Medical History:  Diagnosis Date   Back pain    Colon polyps    COPD (chronic obstructive pulmonary disease) (HCC)    Coronary artery disease    Hx of substance abuse (HCC)    PVD (peripheral vascular disease) (HCC)     Seizures (HCC)    Thrombocytosis    Past Surgical History:  Procedure Laterality Date   CORONARY ANGIOPLASTY WITH STENT PLACEMENT     ESOPHAGOGASTRODUODENOSCOPY (EGD) WITH PROPOFOL N/A 12/30/2017   Procedure: ESOPHAGOGASTRODUODENOSCOPY (EGD) WITH PROPOFOL;  Surgeon: Lynann Bologna, MD;  Location: [facility];  Service: Endoscopy;  Laterality: N/A;   HAND SURGERY     Social History:  reports that he quit smoking about 25 years ago. His smoking use included cigarettes. He started smoking about 45 years ago. He has a 30 pack-year smoking history. He has never used smokeless tobacco. He reports that he does not drink alcohol and does not use drugs.  No Known Allergies  Family History  Problem Relation Age of Onset   Cancer Mother        Unknown   Colon cancer Neg Hx    Pancreatic cancer Neg Hx     Prior to Admission medications   Medication Sig Start Date End Date Taking? Authorizing Provider  albuterol (PROVENTIL HFA;VENTOLIN HFA) 108 (90 Base) MCG/ACT inhaler Inhale 1-2 puffs into the lungs every 6 (six) hours as needed for wheezing or shortness of breath. 12/15/17  Yes Pisciotta, Joni Reining, PA-C  alprostadil (EDEX) 40 MCG injection 40 mcg daily as needed for erectile dysfunction. use no more than 3 times per week   Yes [provider]  ASPIRIN 81 PO Take 1 tablet by mouth daily.   Yes [provider]  atorvastatin (LIPITOR) 40 MG tablet Take 1 tablet (40 mg total) by mouth daily at 6 PM. Patient taking differently: Take 40 mg by mouth daily. 01/01/18  Yes Emokpae, Courage, MD  benzonatate (TESSALON) 100 MG capsule Take 1 capsule (100 mg total) by mouth every 8 (eight) hours. Patient taking differently: Take 100 mg by mouth daily. 11/30/15  Yes Kirichenko, Tatyana, PA-C  clopidogrel (PLAVIX) 75 MG tablet Take 75 mg by mouth daily.   Yes [provider]  HYDROcodone-acetaminophen (NORCO/VICODIN) 5-325 MG tablet Take 1-2 tablets by mouth every 6 hours as needed for  cough. Patient taking differently: Take 1 tablet by mouth 3 (three) times daily. 12/15/17  Yes Pisciotta, Joni Reining, PA-C  ibuprofen (ADVIL) 200 MG tablet Take 400 mg by mouth 2 (two) times daily as needed for headache or moderate pain.   Yes [provider]  isosorbide mononitrate (IMDUR) 30 MG 24 hr tablet Take 30 mg by mouth daily.   Yes [provider]  metoprolol tartrate (LOPRESSOR) 25 MG tablet Take 12.5 mg by mouth 2 (two) times daily.   Yes [provider]    Physical Exam: Vitals:   04/24/23 1011 04/24/23 1012 04/24/23 1100 04/24/23 1453  BP: (!) 147/94  (!) 154/94   Pulse: 70  67   Resp: 18  20   Temp: (!) 97.4 F (36.3 C)   (!) 97.5 F (36.4 C)  TempSrc: Oral     SpO2: 97%  94%   Weight:  77.6 kg    Height:  5' 10.5" (1.791 m)     General 67 year old gentleman laying in stretcher does not appear to be in any distress Respiratory exam: Bilateral intravesicular Cardiovascular exam S1-S2 normal Abdomen soft nontender Extremities warm without edema Data Reviewed:  Labs on Admission:  Results for orders placed or performed during the hospital encounter of 04/24/23 (from the past 24 hour(s))  CBG monitoring, ED     Status: Abnormal   Collection Time: 04/24/23 10:09 AM  Result Value Ref Range   Glucose-Capillary 103 (H) 70 - 99 mg/dL   Comment 1 Notify RN    Comment 2 Document in Chart   Basic metabolic panel     Status: Abnormal   Collection Time: 04/24/23 10:27 AM  Result Value Ref Range   Sodium 139 135 - 145 mmol/L   Potassium 3.9 3.5 - 5.1 mmol/L   Chloride 106 98 - 111 mmol/L   CO2 23 22 - 32 mmol/L   Glucose, Bld 106 (H) 70 - 99 mg/dL   BUN 7 (L) 8 - 23 mg/dL   Creatinine, Ser 1.61 0.61 - 1.24 mg/dL   Calcium 8.8 (L) 8.9 - 10.3 mg/dL   GFR, Estimated >09 >60 mL/min   Anion gap 10 5 - 15  CBC     Status: Abnormal   Collection Time: 04/24/23 10:27 AM  Result Value Ref Range   WBC 6.1 4.0 - 10.5 K/uL   RBC 4.64 4.22 - 5.81 MIL/uL    Hemoglobin 11.4 (L) 13.0 - 17.0 g/dL   HCT 45.4 (L) 09.8 - 11.9 %   MCV 81.5 80.0 - 100.0 fL   MCH 24.6 (L) 26.0 - 34.0 pg   MCHC 30.2 30.0 - 36.0 g/dL   RDW 14.7 (H) 82.9 - 56.2 %   Platelets 374 150 - 400 K/uL   nRBC 0.0 0.0 - 0.2 %  Troponin I (High Sensitivity)     Status: None   Collection Time: 04/24/23  10:27 AM  Result Value Ref Range   Troponin I (High Sensitivity) 17 <18 ng/L  Brain natriuretic peptide     Status: Abnormal   Collection Time: 04/24/23 10:27 AM  Result Value Ref Range   B Natriuretic Peptide 426.2 (H) 0.0 - 100.0 pg/mL  Troponin I (High Sensitivity)     Status: Abnormal   Collection Time: 04/24/23 11:55 AM  Result Value Ref Range   Troponin I (High Sensitivity) 18 (H) <18 ng/L  Urinalysis, Routine w reflex microscopic -Urine, Clean Catch     Status: Abnormal   Collection Time: 04/24/23  1:07 PM  Result Value Ref Range   Color, Urine YELLOW YELLOW   APPearance CLEAR CLEAR   Specific Gravity, Urine 1.026 1.005 - 1.030   pH 5.0 5.0 - 8.0   Glucose, UA NEGATIVE NEGATIVE mg/dL   Hgb urine dipstick NEGATIVE NEGATIVE   Bilirubin Urine NEGATIVE NEGATIVE   Ketones, ur NEGATIVE NEGATIVE mg/dL   Protein, ur 409 (A) NEGATIVE mg/dL   Nitrite NEGATIVE NEGATIVE   Leukocytes,Ua NEGATIVE NEGATIVE   RBC / HPF 0-5 0 - 5 RBC/hpf   WBC, UA 0-5 0 - 5 WBC/hpf   Bacteria, UA RARE (A) NONE SEEN   Squamous Epithelial / HPF 0-5 0 - 5 /HPF   Mucus PRESENT    Hyaline Casts, UA PRESENT    Basic Metabolic Panel: Recent Labs  Lab 04/24/23 1027  NA 139  K 3.9  CL 106  CO2 23  GLUCOSE 106*  BUN 7*  CREATININE 1.11  CALCIUM 8.8*   Liver Function Tests: No results for input(s): "AST", "ALT", "ALKPHOS", "BILITOT", "PROT", "ALBUMIN" in the last 168 hours. No results for input(s): "LIPASE", "AMYLASE" in the last 168 hours. No results for input(s): "AMMONIA" in the last 168 hours. CBC: Recent Labs  Lab 04/24/23 1027  WBC 6.1  HGB 11.4*  HCT 37.8*  MCV 81.5  PLT 374    Cardiac Enzymes: Recent Labs  Lab 04/24/23 1027 04/24/23 1155  TROPONINIHS 17 18*    BNP (last 3 results) No results for input(s): "PROBNP" in the last 8760 hours. CBG: Recent Labs  Lab 04/24/23 1009  GLUCAP 103*    Radiological Exams on Admission:  DG Chest 1 View  Result Date: 04/24/2023 CLINICAL DATA:  SOB EXAM: CHEST  1 VIEW COMPARISON:  CXR 01/14/18 FINDINGS: Status post median sternotomy and CABG. Cardiomegaly.No pleural effusion. No pneumothorax. Unchanged linear opacity in the right upper lobe. There is a new hazy opacity at the right lung base, which could represent atelectasis or infection. No radiographically apparent displaced rib fractures. Visualized upper abdomen is unremarkable. IMPRESSION: 1. New hazy opacity at the right lung base, which could represent atelectasis or infection. 2. Cardiomegaly. If there is clinical concern for a pericardial effusion, further evaluation with echocardiography is recommended. Electronically Signed   By: Lorenza Cambridge M.D.   On: 04/24/2023 11:14    EKG: Independently reviewed.  Normal sinus rhythm   Assessment and Plan: * Near syncope Initial workup including troponin and EKG are reassuring.  We will do ambulatory pulse oximetry given patient is having exertional shortness of breath.  We will monitor patient's glucose, make sure will be monitored on telemetry.  Echo is pending, with note made of initial ER POCUS concerning for reduced EF.  D-dimer pending  Abnormal CXR Routine CT to further define this.  Will get ambulatory pulse oximetry given patient's symptoms of exertional dyspnea      Advance Care Planning:  Code Status: Full Code   Consults: none at this time.  Family Communication: per pateint. He is AAOx3  Severity of Illness: The appropriate patient status for this patient is OBSERVATION. Observation status is judged to be reasonable and necessary in order to provide the required intensity of service to ensure the  patient's safety. The patient's presenting symptoms, physical exam findings, and initial radiographic and laboratory data in the context of their medical condition is felt to place them at decreased risk for further clinical deterioration. Furthermore, it is anticipated that the patient will be medically stable for discharge from the hospital within 2 midnights of admission.   Author: Nolberto Hanlon, MD 04/24/2023 4:15 PM  For on call review [email or web address].

## 2023-04-24 NOTE — Assessment & Plan Note (Signed)
Routine CT to further define this.  Will get ambulatory pulse oximetry given patient's symptoms of exertional dyspnea

## 2023-04-24 NOTE — Assessment & Plan Note (Signed)
This diagnosis is based on history from patient for CABG.  Patient shall be continued on his aspirin and Plavix.  Continue on metoprolol as well as Imdur.  It is noted that the patient's blood pressure is a little bit on the high side on initial presentation here in the ER, heart rate is also acceptable.  There may be room to increase the metoprolol if this trend holds.

## 2023-04-24 NOTE — ED Provider Notes (Signed)
[facility] AT [facility] Provider Note   CSN: [id number] Arrival date & time: 04/24/23  1001     History  Chief Complaint  Patient presents with   Near Syncope    Donald Leblanc is a 67 y.o. male.  Patient is a 67 year old male with a past medical history of CAD status post CABG in April 2024, COPD presenting to the emergency department after a near syncopal episode.  The patient states that he was out in his yard for about 10 minutes hedge trimming when he became lightheaded and felt like he was going to pass out.  He states that he was able to hold onto something and did not completely lose consciousness.  He denies falling to the ground or hitting his head.  He denied any chest pain or shortness of breath.  He states that he did feel very weak and fatigued.  He states that he has had dyspnea on exertion since his CABG but this is not significantly worse than usual.  He denies any recent fever or cough, nausea, vomiting or diarrhea.  He states that he has been eating and drinking normally, taking his medications and otherwise feeling well.  The history is provided by the patient.  Near Syncope       Home Medications Prior to Admission medications   Medication Sig Start Date End Date Taking? Authorizing Provider  albuterol (PROVENTIL HFA;VENTOLIN HFA) 108 (90 Base) MCG/ACT inhaler Inhale 1-2 puffs into the lungs every 6 (six) hours as needed for wheezing or shortness of breath. 12/15/17   Pisciotta, Joni Reining, PA-C  amLODipine (NORVASC) 10 MG tablet Take 10 mg by mouth daily.    [provider]  aspirin EC 81 MG tablet Take 81 mg by mouth daily.    [provider]  atorvastatin (LIPITOR) 40 MG tablet Take 1 tablet (40 mg total) by mouth daily at 6 PM. Patient taking differently: Take 40 mg by mouth daily.  01/01/18   Shon Hale, MD  benzonatate (TESSALON) 100 MG capsule Take 1 capsule (100 mg total) by mouth every 8 (eight)  hours. Patient taking differently: Take 100 mg by mouth daily.  11/30/15   Kirichenko, Lemont Fillers, PA-C  budesonide-formoterol (SYMBICORT) 160-4.5 MCG/ACT inhaler Inhale 2 puffs into the lungs 2 (two) times daily. 01/27/18   Nyoka Cowden, MD  budesonide-formoterol (SYMBICORT) 80-4.5 MCG/ACT inhaler Inhale 2 puffs into the lungs 2 (two) times daily. Patient not taking: Reported on 01/27/2020 12/23/17   Nyoka Cowden, MD  clopidogrel (PLAVIX) 75 MG tablet Take 75 mg by mouth daily.    [provider]  cyclobenzaprine (FLEXERIL) 10 MG tablet Take 1 tablet (10 mg total) by mouth at bedtime. 01/27/20   Placido Sou, PA-C  HYDROcodone-acetaminophen (NORCO/VICODIN) 5-325 MG tablet Take 1-2 tablets by mouth every 6 hours as needed for cough. Patient taking differently: Take 1 tablet by mouth every 8 (eight) hours as needed for moderate pain or severe pain. Take 1-2 tablets by mouth every 6 hours as needed for cough. 12/15/17   Pisciotta, Joni Reining, PA-C  isosorbide mononitrate (IMDUR) 60 MG 24 hr tablet Take 60 mg by mouth daily.    [provider]  metoprolol (LOPRESSOR) 50 MG tablet Take 50 mg by mouth 2 (two) times daily.    [provider]  pantoprazole (PROTONIX) 40 MG tablet Take 1 tablet (40 mg total) by mouth daily. 01/02/18   Shon Hale, MD  potassium chloride SA (KLOR-CON) 20 MEQ tablet Take  20 mEq by mouth daily.     [provider]      Allergies    Patient has no known allergies.    Review of Systems   Review of Systems  Cardiovascular:  Positive for near-syncope.    Physical Exam Updated Vital Signs BP (!) 154/94   Pulse 67   Temp (!) 97.4 F (36.3 C) (Oral)   Resp 20   Ht 5' 10.5" (1.791 m)   Wt 77.6 kg   SpO2 94%   BMI 24.19 kg/m  Physical Exam Vitals and nursing note reviewed.  Constitutional:      General: He is not in acute distress.    Appearance: Normal appearance.  HENT:     Head: Normocephalic and atraumatic.     Nose: Nose  normal.     Mouth/Throat:     Mouth: Mucous membranes are moist.     Pharynx: Oropharynx is clear.  Eyes:     Extraocular Movements: Extraocular movements intact.     Conjunctiva/sclera: Conjunctivae normal.  Cardiovascular:     Rate and Rhythm: Normal rate and regular rhythm.     Pulses: Normal pulses.     Heart sounds: Normal heart sounds.  Pulmonary:     Effort: Pulmonary effort is normal.     Breath sounds: Normal breath sounds.  Abdominal:     General: Abdomen is flat.     Palpations: Abdomen is soft.     Tenderness: There is no abdominal tenderness.  Musculoskeletal:        General: Normal range of motion.     Cervical back: Normal range of motion.  Skin:    General: Skin is warm and dry.  Neurological:     General: No focal deficit present.     Mental Status: He is alert and oriented to person, place, and time.  Psychiatric:        Mood and Affect: Mood normal.        Behavior: Behavior normal.     ED Results / Procedures / Treatments   Labs (all labs ordered are listed, but only abnormal results are displayed) Labs Reviewed  BASIC METABOLIC PANEL - Abnormal; Notable for the following components:      Result Value   Glucose, Bld 106 (*)    BUN 7 (*)    Calcium 8.8 (*)    All other components within normal limits  CBC - Abnormal; Notable for the following components:   Hemoglobin 11.4 (*)    HCT 37.8 (*)    MCH 24.6 (*)    RDW 17.1 (*)    All other components within normal limits  BRAIN NATRIURETIC PEPTIDE - Abnormal; Notable for the following components:   B Natriuretic Peptide 426.2 (*)    All other components within normal limits  CBG MONITORING, ED - Abnormal; Notable for the following components:   Glucose-Capillary 103 (*)    All other components within normal limits  URINALYSIS, ROUTINE W REFLEX MICROSCOPIC  TROPONIN I (HIGH SENSITIVITY)  TROPONIN I (HIGH SENSITIVITY)    EKG EKG Interpretation Date/Time:  Saturday April 24 2023 10:06:38  EDT Ventricular Rate:  69 PR Interval:  218 QRS Duration:  88 QT Interval:  442 QTC Calculation: 474 R Axis:   88  Text Interpretation: Sinus rhythm Borderline prolonged PR interval Borderline right axis deviation Nonspecific T abnrm, anterolateral leads Antero-lateral twave inversions new compared to prior EKG 5 years ago Confirmed by Elayne Snare ([id number]) on 04/24/2023 10:17:54 AM  Radiology DG Chest 1 View  Result Date: 04/24/2023 CLINICAL DATA:  SOB EXAM: CHEST  1 VIEW COMPARISON:  CXR 01/14/18 FINDINGS: Status post median sternotomy and CABG. Cardiomegaly.No pleural effusion. No pneumothorax. Unchanged linear opacity in the right upper lobe. There is a new hazy opacity at the right lung base, which could represent atelectasis or infection. No radiographically apparent displaced rib fractures. Visualized upper abdomen is unremarkable. IMPRESSION: 1. New hazy opacity at the right lung base, which could represent atelectasis or infection. 2. Cardiomegaly. If there is clinical concern for a pericardial effusion, further evaluation with echocardiography is recommended. Electronically Signed   By: Lorenza Cambridge M.D.   On: 04/24/2023 11:14    Procedures Ultrasound ED Echo  Date/Time: 04/24/2023 12:33 PM  Performed by: Rexford Maus, DO Authorized by: Rexford Maus, DO   Procedure details:    Indications: dyspnea and syncope     Views: subxiphoid, parasternal long axis view, parasternal short axis view, apical 4 chamber view and IVC view     Images: archived   Findings:    Pericardium: no pericardial effusion     LV Function: depressed (30 - 50%)     RV Diameter: normal     IVC: normal   Impression:    Impression: decreased contractility       Medications Ordered in ED Medications  furosemide (LASIX) injection 20 mg (has no administration in time range)    ED Course/ Medical Decision Making/ A&P Clinical Course as of 04/24/23 1235  Sat Apr 24, 2023  1207 BNP mildly  elevated without known baseline. No significant pulmonary edema on CXR but does have cardiomegaly. Possible pneumonia on CXR but no fever, cough or leukocytosis making this less likely. Will have bedside ultrasound performed to evaluate for EF/pericardial effusion. [VK]  1225 TTE (12/2022): EF 45-50%, mild global hypokinesis of LV, prolonged relaxation, normal RV, mild MR  [VK]  1231 Bedside ultrasound was performed showing reduced EF, no pericardial effusion with b-lines in the L lung fields. Concern for new onset CHF with high risk syncope and patient was recommended admission. [VK]    Clinical Course User Index [VK] Rexford Maus, DO                                 Medical Decision Making This patient presents to the ED with chief complaint(s) of near syncope with pertinent past medical history of CAD status post CABG, COPD which further complicates the presenting complaint. The complaint involves an extensive differential diagnosis and also carries with it a high risk of complications and morbidity.    The differential diagnosis includes arrhythmia, anemia, dehydration, electrolyte abnormality, heat exhaustion  Additional history obtained: Additional history obtained from N/A Records reviewed Care Everywhere/External Records -primary care records, recent admission records  ED Course and Reassessment: On patient's arrival to the emergency department he is hemodynamically stable in no acute distress at his neurologic baseline and is currently asymptomatic.  EKG on arrival showed normal sinus rhythm with T wave inversions anteriorly inferiorly that was new compared to last EKG 5 years ago, we do not have most recent EKG available from his admission for his CABG.  The patient will have labs including troponin performed and will be closely reassessed.  Independent labs interpretation:  The following labs were independently interpreted: elevated BNP, otherwise within normal  range  Independent visualization of imaging: - I independently visualized the following  imaging with scope of interpretation limited to determining acute life threatening conditions related to emergency care: CXR, which revealed cardiomegaly  Consultation: - Consulted or discussed management/test interpretation w/ external professional: hospitalist  Consideration for admission or further workup: patient requires admission for new onset CHF with near syncope Social Determinants of health: N/A    Amount and/or Complexity of Data Reviewed Labs: ordered. Radiology: ordered.  Risk Prescription drug management.          Final Clinical Impression(s) / ED Diagnoses Final diagnoses:  Near syncope  Acute systolic congestive heart failure [facility])    Rx / DC Orders ED Discharge Orders     None         Rexford Maus, DO 04/24/23 1235

## 2023-04-25 ENCOUNTER — Inpatient Hospital Stay (HOSPITAL_COMMUNITY): Payer: No Typology Code available for payment source

## 2023-04-25 DIAGNOSIS — Z79899 Other long term (current) drug therapy: Secondary | ICD-10-CM | POA: Diagnosis not present

## 2023-04-25 DIAGNOSIS — Z7902 Long term (current) use of antithrombotics/antiplatelets: Secondary | ICD-10-CM | POA: Diagnosis not present

## 2023-04-25 DIAGNOSIS — I441 Atrioventricular block, second degree: Secondary | ICD-10-CM | POA: Diagnosis not present

## 2023-04-25 DIAGNOSIS — I5021 Acute systolic (congestive) heart failure: Secondary | ICD-10-CM | POA: Diagnosis present

## 2023-04-25 DIAGNOSIS — M7989 Other specified soft tissue disorders: Secondary | ICD-10-CM | POA: Diagnosis not present

## 2023-04-25 DIAGNOSIS — I5023 Acute on chronic systolic (congestive) heart failure: Secondary | ICD-10-CM | POA: Diagnosis not present

## 2023-04-25 DIAGNOSIS — N179 Acute kidney failure, unspecified: Secondary | ICD-10-CM | POA: Diagnosis not present

## 2023-04-25 DIAGNOSIS — I2582 Chronic total occlusion of coronary artery: Secondary | ICD-10-CM | POA: Diagnosis not present

## 2023-04-25 DIAGNOSIS — I255 Ischemic cardiomyopathy: Secondary | ICD-10-CM | POA: Diagnosis not present

## 2023-04-25 DIAGNOSIS — Z87891 Personal history of nicotine dependence: Secondary | ICD-10-CM | POA: Diagnosis not present

## 2023-04-25 DIAGNOSIS — I2510 Atherosclerotic heart disease of native coronary artery without angina pectoris: Secondary | ICD-10-CM | POA: Diagnosis not present

## 2023-04-25 DIAGNOSIS — I110 Hypertensive heart disease with heart failure: Secondary | ICD-10-CM | POA: Diagnosis not present

## 2023-04-25 DIAGNOSIS — I429 Cardiomyopathy, unspecified: Secondary | ICD-10-CM | POA: Diagnosis not present

## 2023-04-25 DIAGNOSIS — I440 Atrioventricular block, first degree: Secondary | ICD-10-CM

## 2023-04-25 DIAGNOSIS — R001 Bradycardia, unspecified: Secondary | ICD-10-CM | POA: Diagnosis not present

## 2023-04-25 DIAGNOSIS — J449 Chronic obstructive pulmonary disease, unspecified: Secondary | ICD-10-CM | POA: Diagnosis not present

## 2023-04-25 DIAGNOSIS — R55 Syncope and collapse: Secondary | ICD-10-CM | POA: Diagnosis not present

## 2023-04-25 DIAGNOSIS — I5082 Biventricular heart failure: Secondary | ICD-10-CM | POA: Diagnosis not present

## 2023-04-25 DIAGNOSIS — Z951 Presence of aortocoronary bypass graft: Secondary | ICD-10-CM | POA: Diagnosis not present

## 2023-04-25 DIAGNOSIS — I739 Peripheral vascular disease, unspecified: Secondary | ICD-10-CM | POA: Diagnosis not present

## 2023-04-25 DIAGNOSIS — Z809 Family history of malignant neoplasm, unspecified: Secondary | ICD-10-CM | POA: Diagnosis not present

## 2023-04-25 DIAGNOSIS — Z955 Presence of coronary angioplasty implant and graft: Secondary | ICD-10-CM | POA: Diagnosis not present

## 2023-04-25 DIAGNOSIS — E7800 Pure hypercholesterolemia, unspecified: Secondary | ICD-10-CM | POA: Diagnosis present

## 2023-04-25 LAB — HEPATIC FUNCTION PANEL
ALT: 29 U/L (ref 0–44)
AST: 21 U/L (ref 15–41)
Albumin: 3.1 g/dL — ABNORMAL LOW (ref 3.5–5.0)
Alkaline Phosphatase: 72 U/L (ref 38–126)
Bilirubin, Direct: 0.2 mg/dL (ref 0.0–0.2)
Indirect Bilirubin: 1.3 mg/dL — ABNORMAL HIGH (ref 0.3–0.9)
Total Bilirubin: 1.5 mg/dL — ABNORMAL HIGH (ref 0.3–1.2)
Total Protein: 6.5 g/dL (ref 6.5–8.1)

## 2023-04-25 LAB — POTASSIUM: Potassium: 3.5 mmol/L (ref 3.5–5.1)

## 2023-04-25 LAB — TSH: TSH: 3.002 u[IU]/mL (ref 0.350–4.500)

## 2023-04-25 LAB — PROTIME-INR
INR: 1.2 (ref 0.8–1.2)
Prothrombin Time: 15 s (ref 11.4–15.2)

## 2023-04-25 LAB — APTT: aPTT: 36 s (ref 24–36)

## 2023-04-25 LAB — MAGNESIUM: Magnesium: 2.1 mg/dL (ref 1.7–2.4)

## 2023-04-25 LAB — GLUCOSE, CAPILLARY
Glucose-Capillary: 103 mg/dL — ABNORMAL HIGH (ref 70–99)
Glucose-Capillary: 89 mg/dL (ref 70–99)
Glucose-Capillary: 110 mg/dL — ABNORMAL HIGH (ref 70–99)
Glucose-Capillary: 119 mg/dL — ABNORMAL HIGH (ref 70–99)

## 2023-04-25 MED ORDER — SACUBITRIL-VALSARTAN 24-26 MG PO TABS
1.0000 | ORAL_TABLET | Freq: Two times a day (BID) | ORAL | Status: DC
  Administered 2023-04-25 – 2023-04-28 (×7): 1 via ORAL
  Filled 2023-04-25 (×7): qty 1

## 2023-04-25 MED ORDER — SPIRONOLACTONE 25 MG PO TABS
25.0000 mg | ORAL_TABLET | Freq: Every day | ORAL | Status: DC
  Administered 2023-04-25 – 2023-04-28 (×4): 25 mg via ORAL
  Filled 2023-04-25 (×4): qty 1

## 2023-04-25 NOTE — Progress Notes (Signed)
PROGRESS NOTE    Donald Leblanc  JXB:[id number] DOB: 08-May-1956 DOA: 04/24/2023 PCP: Donald Come, MD   Brief Narrative:  Donald Leblanc is a 67 y.o. male with a hx of CAD s/p CABG x4 April 2024 (LIMA-LAD, SVG- PDA, SVG- OM, SVG-D1), PAD, COPD, HTN, HLD who presented to ED with shortness of breath as well as near syncope episodes.  After admission, patient was diagnosed with Mobitz 2 heart block as well as bradycardia.  Cardiology was consulted.   Assessment & Plan:   Principal Problem:   Near syncope Active Problems:   Abnormal CXR   CAD (coronary artery disease)  Near syncope: Could be cardiac in origin.  Has Mobitz type II heart block.  Beta-blocker discontinued.  Cardiology on board and managing.  History of CAD with CABG x 4 at [facility] April 2024/new onset systolic congestive heart failure: Comes in with some shortness of breath.  Echo now shows 25 to 30% ejection fraction.  Seen by cardiology, has been started on Imdur, Entresto and Aldactone.  May benefit from cardiac cath, will defer to cardiology.  On Plavix.  Hyperlipidemia: Continue atorvastatin.  History of COPD: Stable.  DVT prophylaxis: enoxaparin (LOVENOX) injection 40 mg Start: 04/25/23 0800 SCDs Start: 04/24/23 1318   Code Status: Full Code  Family Communication:  None present at bedside.  Plan of care discussed with patient in length and he/she verbalized understanding and agreed with it.  Status is: Observation The patient will require care spanning > 2 midnights and should be moved to inpatient because: Patient may need cardiac cath.   Estimated body mass index is 22.72 kg/m as calculated from the following:   Height as of this encounter: 5' 10.5" (1.791 m).   Weight as of this encounter: 72.8 kg.    Nutritional Assessment: Body mass index is 22.72 kg/m.Marland Kitchen Seen by dietician.  I agree with the assessment and plan as outlined below: Nutrition Status:        . Skin Assessment: I have examined the  patient's skin and I agree with the wound assessment as performed by the wound care RN as outlined below:    Consultants:  Cardiology  Procedures:  As above  Antimicrobials:  Anti-infectives (From admission, onward)    None         Subjective: Seen and examined.  Feels well.  No chest pain or shortness of breath.  Objective: Vitals:   04/25/23 0154 04/25/23 0156 04/25/23 0617 04/25/23 0814  BP: 136/87  (!) 144/94 131/78  Pulse: 70 74 77 83  Resp: 20 18 18 16   Temp: 98.5 F (36.9 C) 98.5 F (36.9 C) 98.4 F (36.9 C)   TempSrc: Oral Oral Oral   SpO2: 94% (!) 87% 95% 94%  Weight:      Height:        Intake/Output Summary (Last 24 hours) at 04/25/2023 1109 Last data filed at 04/25/2023 0813 Gross per 24 hour  Intake 246 ml  Output 675 ml  Net -429 ml   Filed Weights   04/24/23 1012 04/24/23 1707  Weight: 77.6 kg 72.8 kg    Examination:  General exam: Appears calm and comfortable  Respiratory system: Clear to auscultation. Respiratory effort normal. Cardiovascular system: S1 & S2 heard, RRR. No JVD, murmurs, rubs, gallops or clicks. No pedal edema. Gastrointestinal system: Abdomen is nondistended, soft and nontender. No organomegaly or masses felt. Normal bowel sounds heard. Central nervous system: Alert and oriented. No focal neurological deficits. Extremities: Symmetric 5 x 5 power.  Skin: No rashes, lesions or ulcers Psychiatry: Judgement and insight appear normal. Mood & affect appropriate.    Data Reviewed: I have personally reviewed following labs and imaging studies  CBC: Recent Labs  Lab 04/24/23 1027 04/24/23 1732  WBC 6.1 7.8  HGB 11.4* 12.4*  HCT 37.8* 40.6  MCV 81.5 81.7  PLT 374 400   Basic Metabolic Panel: Recent Labs  Lab 04/24/23 1027 04/24/23 1732 04/25/23 0135  NA 139  --   --   K 3.9  --  3.5  CL 106  --   --   CO2 23  --   --   GLUCOSE 106*  --   --   BUN 7*  --   --   CREATININE 1.11 1.03  --   CALCIUM 8.8*  --   --    MG  --   --  2.1   GFR: Estimated Creatinine Clearance: 72.6 mL/min (by C-G formula based on SCr of 1.03 mg/dL). Liver Function Tests: Recent Labs  Lab 04/25/23 0135  AST 21  ALT 29  ALKPHOS 72  BILITOT 1.5*  PROT 6.5  ALBUMIN 3.1*   No results for input(s): "LIPASE", "AMYLASE" in the last 168 hours. No results for input(s): "AMMONIA" in the last 168 hours. Coagulation Profile: Recent Labs  Lab 04/25/23 0135  INR 1.2   Cardiac Enzymes: No results for input(s): "CKTOTAL", "CKMB", "CKMBINDEX", "TROPONINI" in the last 168 hours. BNP (last 3 results) No results for input(s): "PROBNP" in the last 8760 hours. HbA1C: Recent Labs    04/24/23 1732  HGBA1C 6.3*   CBG: Recent Labs  Lab 04/24/23 1009 04/24/23 1736 04/24/23 2125 04/25/23 0747  GLUCAP 103* 86 126* 103*   Lipid Profile: No results for input(s): "CHOL", "HDL", "LDLCALC", "TRIG", "CHOLHDL", "LDLDIRECT" in the last 72 hours. Thyroid Function Tests: Recent Labs    04/25/23 0135  TSH 3.002   Anemia Panel: No results for input(s): "VITAMINB12", "FOLATE", "FERRITIN", "TIBC", "IRON", "RETICCTPCT" in the last 72 hours. Sepsis Labs: No results for input(s): "PROCALCITON", "LATICACIDVEN" in the last 168 hours.  No results found for this or any previous visit (from the past 240 hour(s)).   Radiology Studies: ECHOCARDIOGRAM COMPLETE  Result Date: 04/24/2023    ECHOCARDIOGRAM REPORT   Patient Name:   Donald Leblanc Date of Exam: 04/24/2023 Medical Rec #:  [id number]       Height:       70.5 in Accession #:    [id number]      Weight:       171.0 lb Date of Birth:  [birth year]      BSA:          1.963 m Patient Age:    66 years        BP:           158/97 mmHg Patient Gender: M               HR:           80 bpm. Exam Location:  Inpatient Procedure: 2D Echo, Cardiac Doppler and Color Doppler Indications:    CHF-Acute Systolic I50.21  History:        Patient has no prior history of Echocardiogram examinations.                  CAD; COPD. Hx of substance abuse (HCC) (From Hx). CABG April                 2024  per patient.  Sonographer:    Celesta Gentile RCS Referring Phys: [facility] GOEL IMPRESSIONS  1. Left ventricular ejection fraction, by estimation, is 25 to 30%. The left ventricle has severely decreased function. The left ventricle demonstrates global hypokinesis. The left ventricular internal cavity size was mildly to moderately dilated. Left ventricular diastolic parameters are consistent with Grade III diastolic dysfunction (restrictive).  2. Right ventricular systolic function is severely reduced. The right ventricular size is normal.  3. Left atrial size was moderately dilated.  4. Right atrial size was moderately dilated.  5. The mitral valve is normal in structure. Moderate to severe mitral valve regurgitation. No evidence of mitral stenosis.  6. The aortic valve is normal in structure. Aortic valve regurgitation is not visualized. No aortic stenosis is present.  7. The inferior vena cava is normal in size with greater than 50% respiratory variability, suggesting right atrial pressure of 3 mmHg. FINDINGS  Left Ventricle: Left ventricular ejection fraction, by estimation, is 25 to 30%. The left ventricle has severely decreased function. The left ventricle demonstrates global hypokinesis. Definity contrast agent was given IV to delineate the left ventricular endocardial borders. The left ventricular internal cavity size was mildly to moderately dilated. There is no left ventricular hypertrophy. Left ventricular diastolic parameters are consistent with Grade III diastolic dysfunction (restrictive). Right Ventricle: The right ventricular size is normal. No increase in right ventricular wall thickness. Right ventricular systolic function is severely reduced. Left Atrium: Left atrial size was moderately dilated. Right Atrium: Right atrial size was moderately dilated. Pericardium: There is no evidence of pericardial effusion. Mitral  Valve: The mitral valve is normal in structure. Moderate to severe mitral valve regurgitation, with centrally-directed jet. No evidence of mitral valve stenosis. Tricuspid Valve: The tricuspid valve is normal in structure. Tricuspid valve regurgitation is mild . No evidence of tricuspid stenosis. Aortic Valve: The aortic valve is normal in structure. Aortic valve regurgitation is not visualized. No aortic stenosis is present. Pulmonic Valve: The pulmonic valve was not well visualized. Pulmonic valve regurgitation is trivial. No evidence of pulmonic stenosis. Aorta: The aortic root is normal in size and structure. Venous: The inferior vena cava is normal in size with greater than 50% respiratory variability, suggesting right atrial pressure of 3 mmHg. IAS/Shunts: No atrial level shunt detected by color flow Doppler.  LEFT VENTRICLE PLAX 2D LVIDd:         5.00 cm      Diastology LVIDs:         4.40 cm      LV e' medial:    4.57 cm/s LV PW:         0.90 cm      LV E/e' medial:  27.1 LV IVS:        1.00 cm      LV e' lateral:   4.57 cm/s LVOT diam:     1.60 cm      LV E/e' lateral: 27.1 LV SV:         27 LV SV Index:   14 LVOT Area:     2.01 cm  LV Volumes (MOD) LV vol d, MOD A2C: 119.0 ml LV vol d, MOD A4C: 104.0 ml LV vol s, MOD A2C: 75.0 ml LV vol s, MOD A4C: 75.3 ml LV SV MOD A2C:     44.0 ml LV SV MOD A4C:     104.0 ml LV SV MOD BP:      38.6 ml RIGHT VENTRICLE RV S prime:  6.20 cm/s TAPSE (M-mode): 0.9 cm LEFT ATRIUM             Index        RIGHT ATRIUM           Index LA diam:        3.90 cm 1.99 cm/m   RA Area:     14.60 cm LA Vol (A2C):   47.1 ml 23.99 ml/m  RA Volume:   35.80 ml  18.24 ml/m LA Vol (A4C):   58.5 ml 29.80 ml/m LA Biplane Vol: 57.3 ml 29.19 ml/m  AORTIC VALVE LVOT Vmax:   68.70 cm/s LVOT Vmean:  45.400 cm/s LVOT VTI:    0.132 m  AORTA Ao Root diam: 3.00 cm MITRAL VALVE                TRICUSPID VALVE MV Area (PHT): 7.44 cm     TR Peak grad:   36.0 mmHg MV Decel Time: 102 msec     TR  Vmax:        300.00 cm/s MR Peak grad: 91.0 mmHg MR Mean grad: 59.0 mmHg     SHUNTS MR Vmax:      477.00 cm/s   Systemic VTI:  0.13 m MR Vmean:     366.0 cm/s    Systemic Diam: 1.60 cm MV E velocity: 124.00 cm/s MV A velocity: 56.60 cm/s MV E/A ratio:  2.19 Arvilla Meres MD Electronically signed by Arvilla Meres MD Signature Date/Time: 04/24/2023/7:22:28 PM    Final    CT CHEST WO CONTRAST  Result Date: 04/24/2023 CLINICAL DATA:  Diffuse interstitial lung disease EXAM: CT CHEST WITHOUT CONTRAST TECHNIQUE: Multidetector CT imaging of the chest was performed following the standard protocol without IV contrast. RADIATION DOSE REDUCTION: This exam was performed according to the departmental dose-optimization program which includes automated exposure control, adjustment of the mA and/or kV according to patient size and/or use of iterative reconstruction technique. COMPARISON:  06/03/2009.  Chest x-ray today FINDINGS: Cardiovascular: Heart is mildly enlarged. Prior CABG. Aortic atherosclerosis. No aneurysm. Mediastinum/Nodes: Borderline mediastinal lymph nodes. Prevascular lymph node has a short axis diameter of 9 mm. Right paratracheal lymph node has a short axis diameter of 9 mm. No visible hilar or axillary adenopathy. Trachea and esophagus are unremarkable. Thyroid unremarkable. Lungs/Pleura: Bronchiectasis and cystic changes/scarring in the right upper lobe and superior segment of the right lower lobe, unchanged. Right calcified pleural plaques in the lower right chest. Small left pleural effusion. No confluent airspace opacities on the left. Upper Abdomen: No acute findings Musculoskeletal: Chest wall soft tissues are unremarkable. No acute bony abnormality. IMPRESSION: Stable bronchiectasis and scarring in the right upper lobe and right lower lobe. Pleural thickening and calcified pleural plaques on the right, stable. Small left pleural effusion. Cardiomegaly. Aortic Atherosclerosis (ICD10-I70.0).  Electronically Signed   By: Charlett Nose M.D.   On: 04/24/2023 17:06   DG Chest 1 View  Result Date: 04/24/2023 CLINICAL DATA:  SOB EXAM: CHEST  1 VIEW COMPARISON:  CXR 01/14/18 FINDINGS: Status post median sternotomy and CABG. Cardiomegaly.No pleural effusion. No pneumothorax. Unchanged linear opacity in the right upper lobe. There is a new hazy opacity at the right lung base, which could represent atelectasis or infection. No radiographically apparent displaced rib fractures. Visualized upper abdomen is unremarkable. IMPRESSION: 1. New hazy opacity at the right lung base, which could represent atelectasis or infection. 2. Cardiomegaly. If there is clinical concern for a pericardial effusion, further evaluation with echocardiography is recommended.  Electronically Signed   By: Lorenza Cambridge M.D.   On: 04/24/2023 11:14    Scheduled Meds:  aspirin  81 mg Oral Daily   atorvastatin  40 mg Oral Daily   clopidogrel  75 mg Oral Daily   enoxaparin (LOVENOX) injection  40 mg Subcutaneous Q24H   insulin aspart  0-15 Units Subcutaneous TID WC   insulin aspart  0-5 Units Subcutaneous QHS   isosorbide mononitrate  30 mg Oral Daily   sacubitril-valsartan  1 tablet Oral BID   sodium chloride flush  3 mL Intravenous Q12H   spironolactone  25 mg Oral Daily   Continuous Infusions:   LOS: 0 days   Hughie Closs, MD [facility]  04/25/2023, 11:09 AM   *Please note that this is a verbal dictation therefore any spelling or grammatical errors are due to the "Dragon [employer] One" system interpretation.  Please page via Amion and do not message via secure chat for urgent patient care matters. Secure chat can be used for non urgent patient care matters.  How to contact the [facility] Attending or Consulting provider 7A - 7P or covering provider during after hours 7P -7A, for this patient?  Check the care team in [facility] and look for a) attending/consulting [facility] provider listed and b) the [facility] team listed. Page or secure  chat 7A-7P. Log into [email or web address] and use [place]'s universal password to access. If you do not have the password, please contact the hospital operator. Locate the [facility] provider you are looking for under [facility] and page to a number that you can be directly reached. If you still have difficulty reaching the provider, please page the [facility] (Director on Call) for the Hospitalists listed on amion for assistance.

## 2023-04-25 NOTE — Progress Notes (Signed)
Dr. Loney Loh notified and was able to capture 2nd degree type 1 on EKG.  Pt continues to be asymptomatic.

## 2023-04-25 NOTE — Progress Notes (Signed)
Night coverage  Notified by RN that patient is having intermittent second-degree type heart block on telemetry.  Heart rate in the 30s to 40s.  He is asymptomatic and blood pressure has remained stable.  He is on metoprolol 12.5 mg twice daily.  Stat EKG done and reviewed with on-call cardiologist Dr. Lily Peer, showing sinus rhythm with second-degree Mobitz type I AV block with 2:1 AV conduction.  Metoprolol has been stopped and cardiology recommending ambulatory cardiac monitor on discharge.  Cardiology team will consult.  Labs ordered to check electrolytes and TSH.

## 2023-04-25 NOTE — Progress Notes (Signed)
Pt noted to be in SR with first degree heart block with occasional PVC's with intermittent 2nd degree type 2 heart block with rate as low as 36.  Pt awake and denies any complaints.  BP 136/97.  Dr. Loney Loh notified via [facility] and awaiting return call.

## 2023-04-25 NOTE — Consult Note (Signed)
Cardiology Consultation   Patient ID: Donald Leblanc MRN: [id number]; DOB: 06-Feb-1956  Admit date: 04/24/2023 Date of Consult: 04/25/2023  PCP:  Sondra Come, MD   [facility] Providers Cardiologist:  None        Patient Profile:   Donald Leblanc is a 67 y.o. male with a hx of CAD s/p CABG x4 April 2024 (LIMA-LAD, SVG- PDA, SVG- OM, SVG-D1), PAD, COPD, HTN, HLD  who is being seen 04/25/2023 for the evaluation of bradycardia, near syncope at the request of Dr Jacqulyn Bath.  History of Present Illness:   Donald Leblanc is a 67 y.o. male with a hx of CAD s/p CABG x4 April 2024 (LIMA-LAD, SVG- PDA, SVG- OM, SVG-D1), PAD, COPD, HTN, HLD  who is being seen 04/25/2023 for the evaluation of bradycardia, near syncope  Pt presented to the hospital after he was working out in his yard- neigbor saw him lean against trash scan- near syncopal episode. Does report getting lightheaded and was about to pass out- did not loose consciousness completely. Denied any chest pain, sob, palps, or other symptoms. Since CABG- he has been having exertional sob. ER workup was largely negative initial vitals were stable, heart rate stable, glucose was normal, troponin negative with next troponin is 18 Initial EKG normal sinus rhythm, first-degree AV block, second EKG shows 2-1 block with PVC EKG obtained at night shows 2-1 block likely this is Wenckebach/Mobitz 1 because PR interval is changing and PR interval after block is the shortest patient was sleeping at this time asymptomatic Cardiology is consulted given heart block, newly reduced EF.  He was on beta-blocker which has been discontinued  Pt denies any chest pain.  Past Medical History:  Diagnosis Date   Back pain    Colon polyps    COPD (chronic obstructive pulmonary disease) (HCC)    Coronary artery disease    Hx of substance abuse (HCC)    PVD (peripheral vascular disease) (HCC)    Seizures (HCC)    Thrombocytosis     Past Surgical  History:  Procedure Laterality Date   CORONARY ANGIOPLASTY WITH STENT PLACEMENT     ESOPHAGOGASTRODUODENOSCOPY (EGD) WITH PROPOFOL N/A 12/30/2017   Procedure: ESOPHAGOGASTRODUODENOSCOPY (EGD) WITH PROPOFOL;  Surgeon: Lynann Bologna, MD;  Location: [facility];  Service: Endoscopy;  Laterality: N/A;   HAND SURGERY       Home Medications:  Prior to Admission medications   Medication Sig Start Date End Date Taking? Authorizing Provider  albuterol (PROVENTIL HFA;VENTOLIN HFA) 108 (90 Base) MCG/ACT inhaler Inhale 1-2 puffs into the lungs every 6 (six) hours as needed for wheezing or shortness of breath. 12/15/17  Yes Pisciotta, Joni Reining, PA-C  alprostadil (EDEX) 40 MCG injection 40 mcg daily as needed for erectile dysfunction. use no more than 3 times per week   Yes [provider]  ASPIRIN 81 PO Take 1 tablet by mouth daily.   Yes [provider]  atorvastatin (LIPITOR) 40 MG tablet Take 1 tablet (40 mg total) by mouth daily at 6 PM. Patient taking differently: Take 40 mg by mouth daily. 01/01/18  Yes Emokpae, Courage, MD  benzonatate (TESSALON) 100 MG capsule Take 1 capsule (100 mg total) by mouth every 8 (eight) hours. Patient taking differently: Take 100 mg by mouth daily. 11/30/15  Yes Kirichenko, Tatyana, PA-C  clopidogrel (PLAVIX) 75 MG tablet Take 75 mg by mouth daily.   Yes [provider]  HYDROcodone-acetaminophen (NORCO/VICODIN) 5-325 MG tablet Take 1-2 tablets by mouth every 6 hours  as needed for cough. Patient taking differently: Take 1 tablet by mouth 3 (three) times daily. 12/15/17  Yes Pisciotta, Joni Reining, PA-C  ibuprofen (ADVIL) 200 MG tablet Take 400 mg by mouth 2 (two) times daily as needed for headache or moderate pain.   Yes [provider]  isosorbide mononitrate (IMDUR) 30 MG 24 hr tablet Take 30 mg by mouth daily.   Yes [provider]  metoprolol tartrate (LOPRESSOR) 25 MG tablet Take 12.5 mg by mouth 2 (two) times daily.   Yes [provider]    Inpatient Medications: Scheduled Meds:  aspirin  81 mg Oral Daily   atorvastatin  40 mg Oral Daily   clopidogrel  75 mg Oral Daily   enoxaparin (LOVENOX) injection  40 mg Subcutaneous Q24H   insulin aspart  0-15 Units Subcutaneous TID WC   insulin aspart  0-5 Units Subcutaneous QHS   isosorbide mononitrate  30 mg Oral Daily   sodium chloride flush  3 mL Intravenous Q12H   Continuous Infusions:  PRN Meds: acetaminophen **OR** acetaminophen, albuterol, HYDROcodone-acetaminophen, polyethylene glycol  Allergies:   No Known Allergies  Social History:   Social History   Socioeconomic History   Marital status: Married    Spouse name: Not on file   Number of children: Not on file   Years of education: Not on file   Highest education level: Not on file  Occupational History   Not on file  Tobacco Use   Smoking status: Former    Current packs/day: 0.00    Average packs/day: 1.5 packs/day for 20.0 years (30.0 ttl pk-yrs)    Types: Cigarettes    Start date: 12/23/1977    Quit date: 12/23/1997    Years since quitting: 25.3   Smokeless tobacco: Never  Vaping Use   Vaping status: Never Used  Substance and Sexual Activity   Alcohol use: No   Drug use: No   Sexual activity: Not on file  Other Topics Concern   Not on file  Social History Narrative   Not on file   Social Determinants of Health   Financial Resource Strain: Not on file  Food Insecurity: No Food Insecurity (04/24/2023)   Hunger Vital Sign    Worried About Running Out of Food in the Last Year: Never true    Ran Out of Food in the Last Year: Never true  Transportation Needs: No Transportation Needs (04/24/2023)   PRAPARE - Administrator, Civil Service (Medical): No    Lack of Transportation (Non-Medical): No  Physical Activity: Not on file  Stress: Not on file  Social Connections: Not on file  Intimate Partner Violence: Not At Risk (04/24/2023)   Humiliation, Afraid, Rape, and Kick  questionnaire    Fear of Current or Ex-Partner: No    Emotionally Abused: No    Physically Abused: No    Sexually Abused: No    Family History:    Family History  Problem Relation Age of Onset   Cancer Mother        Unknown   Colon cancer Neg Hx    Pancreatic cancer Neg Hx      ROS:  Please see the history of present illness.   All other ROS reviewed and negative.     Physical Exam/Data:   Vitals:   04/24/23 2353 04/25/23 0154 04/25/23 0156 04/25/23 0617  BP: (!) 141/90 136/87  (!) 144/94  Pulse: 72 70 74 77  Resp: 18 20 18  18  Temp: 98.6 F (37 C) 98.5 F (36.9 C) 98.5 F (36.9 C) 98.4 F (36.9 C)  TempSrc: Oral Oral Oral Oral  SpO2: 95% 94% (!) 87% 95%  Weight:      Height:        Intake/Output Summary (Last 24 hours) at 04/25/2023 0623 Last data filed at 04/25/2023 1610 Gross per 24 hour  Intake 243 ml  Output 675 ml  Net -432 ml      04/24/2023    5:07 PM 04/24/2023   10:12 AM 01/27/2018    9:37 AM  Last 3 Weights  Weight (lbs) 160 lb 9.6 oz 171 lb 178 lb  Weight (kg) 72.848 kg 77.565 kg 80.74 kg     Body mass index is 22.72 kg/m.  General:  Well nourished, well developed, in no acute distress HEENT: normal Neck: no JVD Vascular: No carotid bruits; Distal pulses 2+ bilaterally Cardiac:  normal S1, S2; RRR; no murmur  Lungs:  clear to auscultation bilaterally, no wheezing, rhonchi or rales  Abd: soft, nontender, no hepatomegaly  Ext: no edema Musculoskeletal:  No deformities, BUE and BLE strength normal and equal Skin: warm and dry  Neuro:  CNs 2-12 intact, no focal abnormalities noted Psych:  Normal affect   EKG:  The EKG was personally reviewed and demonstrates:   Telemetry:  Telemetry was personally reviewed and demonstrates:    Relevant CV Studies: As below  Laboratory Data:  High Sensitivity Troponin:   Recent Labs  Lab 04/24/23 1027 04/24/23 1155  TROPONINIHS 17 18*     Chemistry Recent Labs  Lab 04/24/23 1027 04/24/23 1732  04/25/23 0135  NA 139  --   --   K 3.9  --  3.5  CL 106  --   --   CO2 23  --   --   GLUCOSE 106*  --   --   BUN 7*  --   --   CREATININE 1.11 1.03  --   CALCIUM 8.8*  --   --   MG  --   --  2.1  GFRNONAA >60 >60  --   ANIONGAP 10  --   --     Recent Labs  Lab 04/25/23 0135  PROT 6.5  ALBUMIN 3.1*  AST 21  ALT 29  ALKPHOS 72  BILITOT 1.5*   Lipids No results for input(s): "CHOL", "TRIG", "HDL", "LABVLDL", "LDLCALC", "CHOLHDL" in the last 168 hours.  Hematology Recent Labs  Lab 04/24/23 1027 04/24/23 1732  WBC 6.1 7.8  RBC 4.64 4.97  HGB 11.4* 12.4*  HCT 37.8* 40.6  MCV 81.5 81.7  MCH 24.6* 24.9*  MCHC 30.2 30.5  RDW 17.1* 17.0*  PLT 374 400   Thyroid  Recent Labs  Lab 04/25/23 0135  TSH 3.002    BNP Recent Labs  Lab 04/24/23 1027  BNP 426.2*    DDimer  Recent Labs  Lab 04/24/23 1028  DDIMER 0.68*     Radiology/Studies:  ECHOCARDIOGRAM COMPLETE  Result Date: 04/24/2023    ECHOCARDIOGRAM REPORT   Patient Name:   Silvio Sausedo Date of Exam: 04/24/2023 Medical Rec #:  [id number]       Height:       70.5 in Accession #:    [id number]      Weight:       171.0 lb Date of Birth:  01-16-1956      BSA:          1.963 m Patient Age:  66 years        BP:           158/97 mmHg Patient Gender: M               HR:           80 bpm. Exam Location:  Inpatient Procedure: 2D Echo, Cardiac Doppler and Color Doppler Indications:    CHF-Acute Systolic I50.21  History:        Patient has no prior history of Echocardiogram examinations.                 CAD; COPD. Hx of substance abuse (HCC) (From Hx). CABG April                 2024 per patient.  Sonographer:    Celesta Gentile RCS Referring Phys: [facility] GOEL IMPRESSIONS  1. Left ventricular ejection fraction, by estimation, is 25 to 30%. The left ventricle has severely decreased function. The left ventricle demonstrates global hypokinesis. The left ventricular internal cavity size was mildly to moderately dilated. Left  ventricular diastolic parameters are consistent with Grade III diastolic dysfunction (restrictive).  2. Right ventricular systolic function is severely reduced. The right ventricular size is normal.  3. Left atrial size was moderately dilated.  4. Right atrial size was moderately dilated.  5. The mitral valve is normal in structure. Moderate to severe mitral valve regurgitation. No evidence of mitral stenosis.  6. The aortic valve is normal in structure. Aortic valve regurgitation is not visualized. No aortic stenosis is present.  7. The inferior vena cava is normal in size with greater than 50% respiratory variability, suggesting right atrial pressure of 3 mmHg. FINDINGS  Left Ventricle: Left ventricular ejection fraction, by estimation, is 25 to 30%. The left ventricle has severely decreased function. The left ventricle demonstrates global hypokinesis. Definity contrast agent was given IV to delineate the left ventricular endocardial borders. The left ventricular internal cavity size was mildly to moderately dilated. There is no left ventricular hypertrophy. Left ventricular diastolic parameters are consistent with Grade III diastolic dysfunction (restrictive). Right Ventricle: The right ventricular size is normal. No increase in right ventricular wall thickness. Right ventricular systolic function is severely reduced. Left Atrium: Left atrial size was moderately dilated. Right Atrium: Right atrial size was moderately dilated. Pericardium: There is no evidence of pericardial effusion. Mitral Valve: The mitral valve is normal in structure. Moderate to severe mitral valve regurgitation, with centrally-directed jet. No evidence of mitral valve stenosis. Tricuspid Valve: The tricuspid valve is normal in structure. Tricuspid valve regurgitation is mild . No evidence of tricuspid stenosis. Aortic Valve: The aortic valve is normal in structure. Aortic valve regurgitation is not visualized. No aortic stenosis is present.  Pulmonic Valve: The pulmonic valve was not well visualized. Pulmonic valve regurgitation is trivial. No evidence of pulmonic stenosis. Aorta: The aortic root is normal in size and structure. Venous: The inferior vena cava is normal in size with greater than 50% respiratory variability, suggesting right atrial pressure of 3 mmHg. IAS/Shunts: No atrial level shunt detected by color flow Doppler.  LEFT VENTRICLE PLAX 2D LVIDd:         5.00 cm      Diastology LVIDs:         4.40 cm      LV e' medial:    4.57 cm/s LV PW:         0.90 cm      LV E/e' medial:  27.1 LV  IVS:        1.00 cm      LV e' lateral:   4.57 cm/s LVOT diam:     1.60 cm      LV E/e' lateral: 27.1 LV SV:         27 LV SV Index:   14 LVOT Area:     2.01 cm  LV Volumes (MOD) LV vol d, MOD A2C: 119.0 ml LV vol d, MOD A4C: 104.0 ml LV vol s, MOD A2C: 75.0 ml LV vol s, MOD A4C: 75.3 ml LV SV MOD A2C:     44.0 ml LV SV MOD A4C:     104.0 ml LV SV MOD BP:      38.6 ml RIGHT VENTRICLE RV S prime:     6.20 cm/s TAPSE (M-mode): 0.9 cm LEFT ATRIUM             Index        RIGHT ATRIUM           Index LA diam:        3.90 cm 1.99 cm/m   RA Area:     14.60 cm LA Vol (A2C):   47.1 ml 23.99 ml/m  RA Volume:   35.80 ml  18.24 ml/m LA Vol (A4C):   58.5 ml 29.80 ml/m LA Biplane Vol: 57.3 ml 29.19 ml/m  AORTIC VALVE LVOT Vmax:   68.70 cm/s LVOT Vmean:  45.400 cm/s LVOT VTI:    0.132 m  AORTA Ao Root diam: 3.00 cm MITRAL VALVE                TRICUSPID VALVE MV Area (PHT): 7.44 cm     TR Peak grad:   36.0 mmHg MV Decel Time: 102 msec     TR Vmax:        300.00 cm/s MR Peak grad: 91.0 mmHg MR Mean grad: 59.0 mmHg     SHUNTS MR Vmax:      477.00 cm/s   Systemic VTI:  0.13 m MR Vmean:     366.0 cm/s    Systemic Diam: 1.60 cm MV E velocity: 124.00 cm/s MV A velocity: 56.60 cm/s MV E/A ratio:  2.19 Arvilla Meres MD Electronically signed by Arvilla Meres MD Signature Date/Time: 04/24/2023/7:22:28 PM    Final    CT CHEST WO CONTRAST  Result Date:  04/24/2023 CLINICAL DATA:  Diffuse interstitial lung disease EXAM: CT CHEST WITHOUT CONTRAST TECHNIQUE: Multidetector CT imaging of the chest was performed following the standard protocol without IV contrast. RADIATION DOSE REDUCTION: This exam was performed according to the departmental dose-optimization program which includes automated exposure control, adjustment of the mA and/or kV according to patient size and/or use of iterative reconstruction technique. COMPARISON:  06/03/2009.  Chest x-ray today FINDINGS: Cardiovascular: Heart is mildly enlarged. Prior CABG. Aortic atherosclerosis. No aneurysm. Mediastinum/Nodes: Borderline mediastinal lymph nodes. Prevascular lymph node has a short axis diameter of 9 mm. Right paratracheal lymph node has a short axis diameter of 9 mm. No visible hilar or axillary adenopathy. Trachea and esophagus are unremarkable. Thyroid unremarkable. Lungs/Pleura: Bronchiectasis and cystic changes/scarring in the right upper lobe and superior segment of the right lower lobe, unchanged. Right calcified pleural plaques in the lower right chest. Small left pleural effusion. No confluent airspace opacities on the left. Upper Abdomen: No acute findings Musculoskeletal: Chest wall soft tissues are unremarkable. No acute bony abnormality. IMPRESSION: Stable bronchiectasis and scarring in the right upper lobe and right lower lobe. Pleural  thickening and calcified pleural plaques on the right, stable. Small left pleural effusion. Cardiomegaly. Aortic Atherosclerosis (ICD10-I70.0). Electronically Signed   By: Charlett Nose M.D.   On: 04/24/2023 17:06   DG Chest 1 View  Result Date: 04/24/2023 CLINICAL DATA:  SOB EXAM: CHEST  1 VIEW COMPARISON:  CXR 01/14/18 FINDINGS: Status post median sternotomy and CABG. Cardiomegaly.No pleural effusion. No pneumothorax. Unchanged linear opacity in the right upper lobe. There is a new hazy opacity at the right lung base, which could represent atelectasis or  infection. No radiographically apparent displaced rib fractures. Visualized upper abdomen is unremarkable. IMPRESSION: 1. New hazy opacity at the right lung base, which could represent atelectasis or infection. 2. Cardiomegaly. If there is clinical concern for a pericardial effusion, further evaluation with echocardiography is recommended. Electronically Signed   By: Lorenza Cambridge M.D.   On: 04/24/2023 11:14    CATH 12/21/22 Preliminary findings:   LM: 50% distal  LAD: 90% mid  Ramus: 95% ostial  LCx: 90% ostial  RCA: 100% mid; L-R collaterals  EBL<53ml   LM and significant 3V obstructive CAD. Will admit. I have consulted CT  Surgery for consideration of CABG.   Coronary Findings Diagnostic Dominance: Right  Left Main: Dist LM to Ost LAD lesion is 50% stenosed.  Left Anterior Descending: Prox LAD lesion is 50% stenosed. Mid LAD lesion is 95% stenosed. Dist LAD lesion is 20% stenosed.  Ramus Intermedius: Ramus lesion is 95% stenosed.  Left Circumflex: Ost Cx lesion is 90% stenosed. Mid Cx lesion is 30% stenosed.  Right Coronary Artery: Dist RCA filled by collaterals from Dist Cx. Prox RCA lesion is 80% stenosed. Mid RCA lesion is 100% stenosed.   ECHO: 12/23/22 SUMMARY  LV ejection fraction = 45-50%.  There is mild global hypokinesis of the left ventricle.  Left ventricular filling pattern is prolonged relaxation.  The right ventricle is normal in size and function.  There is mild mitral regurgitation.  IVC size was normal.  There is no pericardial effusion.  -  ECHO:04/24/23 IMPRESSIONS     1. Left ventricular ejection fraction, by estimation, is 25 to 30%. The  left ventricle has severely decreased function. The left ventricle  demonstrates global hypokinesis. The left ventricular internal cavity size  was mildly to moderately dilated. Left  ventricular diastolic parameters are consistent with Grade III diastolic  dysfunction (restrictive).   2. Right ventricular systolic function  is severely reduced. The right  ventricular size is normal.   3. Left atrial size was moderately dilated.   4. Right atrial size was moderately dilated.   5. The mitral valve is normal in structure. Moderate to severe mitral  valve regurgitation. No evidence of mitral stenosis.   6. The aortic valve is normal in structure. Aortic valve regurgitation is  not visualized. No aortic stenosis is present.   7. The inferior vena cava is normal in size with greater than 50%  respiratory variability, suggesting right atrial pressure of 3 mmHg.    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  Newly dropped EF 25 to 30%, grade 3 diastolic dysfunction, RV dysfunction  on echo 04/24/2023    Plan: - continue telemetry, d/c metoprolol for now. He has nocturnal mobitz 1, and his HR  is already in 70s now in day time - initiate GDMT given LV dysfunction: initiate entresto 24/26mg  BID, spironolactone 25mg  daily. Likely will need to re institute metoprolol. Unclear etiology of drop in EF- he is euvolemic, compliant with meds ?Acute closure of his grafts. Might need to consider LHC trow. However, pt wants to think about it - euvolemic on exam. Continue DAPT (Aspirin+plavix) - Likely will need 1 month monitor outpatient given presyncope episode on discharge.  Will follow in am.  Risk Assessment/Risk Scores:                For questions or updates, please contact [facility] Please consult [email or web address] for contact info under    Signed, Elmon Kirschner, MD  04/25/2023 6:23 AM

## 2023-04-25 NOTE — Progress Notes (Signed)
Lower extremity venous bilateral study completed.   Please see CV Proc for preliminary results.   Izabelle Daus, RDMS, RVT  

## 2023-04-26 ENCOUNTER — Other Ambulatory Visit (HOSPITAL_COMMUNITY): Payer: Self-pay

## 2023-04-26 ENCOUNTER — Encounter (HOSPITAL_COMMUNITY)
Admission: EM | Disposition: A | Discharge status: Home / Self Care | Payer: Self-pay | Source: Home / Self Care | Attending: Family Medicine

## 2023-04-26 DIAGNOSIS — I2510 Atherosclerotic heart disease of native coronary artery without angina pectoris: Secondary | ICD-10-CM

## 2023-04-26 DIAGNOSIS — I255 Ischemic cardiomyopathy: Secondary | ICD-10-CM | POA: Diagnosis not present

## 2023-04-26 DIAGNOSIS — I5082 Biventricular heart failure: Secondary | ICD-10-CM | POA: Diagnosis not present

## 2023-04-26 DIAGNOSIS — I441 Atrioventricular block, second degree: Secondary | ICD-10-CM

## 2023-04-26 DIAGNOSIS — R55 Syncope and collapse: Secondary | ICD-10-CM | POA: Diagnosis not present

## 2023-04-26 DIAGNOSIS — I429 Cardiomyopathy, unspecified: Secondary | ICD-10-CM

## 2023-04-26 HISTORY — PX: RIGHT/LEFT HEART CATH AND CORONARY/GRAFT ANGIOGRAPHY: CATH118267

## 2023-04-26 LAB — POCT I-STAT EG7
Acid-Base Excess: 0 mmol/L (ref 0.0–2.0)
Bicarbonate: 25.4 mmol/L (ref 20.0–28.0)
Calcium, Ion: 1.17 mmol/L (ref 1.15–1.40)
HCT: 49 % (ref 39.0–52.0)
Hemoglobin: 16.7 g/dL (ref 13.0–17.0)
O2 Saturation: 61 %
Potassium: 3.5 mmol/L (ref 3.5–5.1)
Sodium: 143 mmol/L (ref 135–145)
TCO2: 27 mmol/L (ref 22–32)
pCO2, Ven: 44.1 mmHg (ref 44–60)
pH, Ven: 7.369 (ref 7.25–7.43)
pO2, Ven: 33 mmHg (ref 32–45)

## 2023-04-26 LAB — BASIC METABOLIC PANEL
Anion gap: 9 (ref 5–15)
BUN: 5 mg/dL — ABNORMAL LOW (ref 8–23)
CO2: 24 mmol/L (ref 22–32)
Calcium: 9.2 mg/dL (ref 8.9–10.3)
Chloride: 106 mmol/L (ref 98–111)
Creatinine, Ser: 0.8 mg/dL (ref 0.61–1.24)
GFR, Estimated: >60 mL/min (ref >60)
Glucose, Bld: 107 mg/dL — ABNORMAL HIGH (ref 70–99)
Potassium: 3.4 mmol/L — ABNORMAL LOW (ref 3.5–5.1)
Sodium: 139 mmol/L (ref 135–145)

## 2023-04-26 LAB — GLUCOSE, CAPILLARY
Glucose-Capillary: 116 mg/dL — ABNORMAL HIGH (ref 70–99)
Glucose-Capillary: 129 mg/dL — ABNORMAL HIGH (ref 70–99)
Glucose-Capillary: 94 mg/dL (ref 70–99)
Glucose-Capillary: 139 mg/dL — ABNORMAL HIGH (ref 70–99)

## 2023-04-26 LAB — POCT I-STAT 7, (LYTES, BLD GAS, ICA,H+H)
Acid-base deficit: 1 mmol/L (ref 0.0–2.0)
Bicarbonate: 22.6 mmol/L (ref 20.0–28.0)
Calcium, Ion: 1.21 mmol/L (ref 1.15–1.40)
HCT: 49 % (ref 39.0–52.0)
Hemoglobin: 16.7 g/dL (ref 13.0–17.0)
O2 Saturation: 95 %
Potassium: 3.7 mmol/L (ref 3.5–5.1)
Sodium: 141 mmol/L (ref 135–145)
TCO2: 24 mmol/L (ref 22–32)
pCO2 arterial: 34.7 mmHg (ref 32–48)
pH, Arterial: 7.422 (ref 7.35–7.45)
pO2, Arterial: 73 mmHg — ABNORMAL LOW (ref 83–108)

## 2023-04-26 SURGERY — RIGHT/LEFT HEART CATH AND CORONARY/GRAFT ANGIOGRAPHY
Anesthesia: LOCAL

## 2023-04-26 MED ORDER — LIDOCAINE HCL (PF) 1 % IJ SOLN
INTRAMUSCULAR | Status: AC
  Filled 2023-04-26: qty 30

## 2023-04-26 MED ORDER — SODIUM CHLORIDE 0.9 % IV SOLN: 250.0000 mL | INTRAVENOUS | Status: DC | PRN

## 2023-04-26 MED ORDER — HEPARIN (PORCINE) IN NACL 1000-0.9 UT/500ML-% IV SOLN
INTRAVENOUS | Status: DC | PRN
  Administered 2023-04-26 (×2): 500 mL

## 2023-04-26 MED ORDER — HEPARIN SODIUM (PORCINE) 1000 UNIT/ML IJ SOLN
INTRAMUSCULAR | Status: DC | PRN
  Administered 2023-04-26: 3500 [IU] via INTRAVENOUS

## 2023-04-26 MED ORDER — MIDAZOLAM HCL 2 MG/2ML IJ SOLN
INTRAMUSCULAR | Status: AC
  Filled 2023-04-26: qty 2

## 2023-04-26 MED ORDER — FENTANYL CITRATE (PF) 100 MCG/2ML IJ SOLN
INTRAMUSCULAR | Status: DC | PRN
  Administered 2023-04-26: 25 ug via INTRAVENOUS

## 2023-04-26 MED ORDER — SODIUM CHLORIDE 0.9 % IV SOLN: INTRAVENOUS | Status: DC

## 2023-04-26 MED ORDER — SODIUM CHLORIDE 0.9% FLUSH
3.0000 mL | Freq: Two times a day (BID) | INTRAVENOUS | Status: DC
  Administered 2023-04-27 – 2023-04-28 (×2): 3 mL via INTRAVENOUS

## 2023-04-26 MED ORDER — LABETALOL HCL 5 MG/ML IV SOLN: 10.0000 mg | INTRAVENOUS | Status: AC | PRN

## 2023-04-26 MED ORDER — MIDAZOLAM HCL 2 MG/2ML IJ SOLN
INTRAMUSCULAR | Status: DC | PRN
  Administered 2023-04-26: 1 mg via INTRAVENOUS

## 2023-04-26 MED ORDER — VERAPAMIL HCL 2.5 MG/ML IV SOLN
INTRAVENOUS | Status: AC
  Filled 2023-04-26: qty 2

## 2023-04-26 MED ORDER — VERAPAMIL HCL 2.5 MG/ML IV SOLN
INTRAVENOUS | Status: DC | PRN
  Administered 2023-04-26: 10 mL via INTRA_ARTERIAL

## 2023-04-26 MED ORDER — ASPIRIN 81 MG PO CHEW: 81.0000 mg | CHEWABLE_TABLET | ORAL | Status: AC

## 2023-04-26 MED ORDER — SODIUM CHLORIDE 0.9 % IV SOLN: INTRAVENOUS | Status: AC

## 2023-04-26 MED ORDER — HEPARIN SODIUM (PORCINE) 1000 UNIT/ML IJ SOLN
INTRAMUSCULAR | Status: AC
  Filled 2023-04-26: qty 10

## 2023-04-26 MED ORDER — FENTANYL CITRATE (PF) 100 MCG/2ML IJ SOLN
INTRAMUSCULAR | Status: AC
  Filled 2023-04-26: qty 2

## 2023-04-26 MED ORDER — SODIUM CHLORIDE 0.9% FLUSH: 3.0000 mL | INTRAVENOUS | Status: DC | PRN

## 2023-04-26 MED ORDER — ASPIRIN 81 MG PO CHEW: 81.0000 mg | CHEWABLE_TABLET | ORAL | Status: DC

## 2023-04-26 MED ORDER — LIDOCAINE HCL (PF) 1 % IJ SOLN
INTRAMUSCULAR | Status: DC | PRN
  Administered 2023-04-26: 5 mL via INTRADERMAL

## 2023-04-26 MED ORDER — HYDRALAZINE HCL 20 MG/ML IJ SOLN: 10.0000 mg | INTRAMUSCULAR | Status: AC | PRN

## 2023-04-26 MED ORDER — IOHEXOL 350 MG/ML SOLN
INTRAVENOUS | Status: DC | PRN
  Administered 2023-04-26: 57 mL

## 2023-04-26 SURGICAL SUPPLY — 11 items
CATH BALLN WEDGE 5F 110CM (CATHETERS) IMPLANT
CATH INFINITI 5FR MULTPACK ANG (CATHETERS) IMPLANT
DEVICE RAD COMP TR BAND LRG (VASCULAR PRODUCTS) IMPLANT
GLIDESHEATH SLEND SS 6F .021 (SHEATH) IMPLANT
GUIDEWIRE INQWIRE 1.5J.035X260 (WIRE) IMPLANT
INQWIRE 1.5J .035X260CM (WIRE) ×1
PACK CARDIAC CATHETERIZATION (CUSTOM PROCEDURE TRAY) ×1 IMPLANT
PROTECTION STATION PRESSURIZED (MISCELLANEOUS) ×1
SET ATX-X65L (MISCELLANEOUS) IMPLANT
SHEATH GLIDE SLENDER 4/5FR (SHEATH) IMPLANT
STATION PROTECTION PRESSURIZED (MISCELLANEOUS) IMPLANT

## 2023-04-26 NOTE — Progress Notes (Signed)
Rounding Note    Patient Name: Donald Leblanc Date of Encounter: 04/26/2023  [facility] Cardiologist: [facility]  Subjective   No acute events overnight. He is NPO and wants to proceed with cath.  Inpatient Medications    Scheduled Meds:  aspirin  81 mg Oral Daily   atorvastatin  40 mg Oral Daily   clopidogrel  75 mg Oral Daily   enoxaparin (LOVENOX) injection  40 mg Subcutaneous Q24H   insulin aspart  0-15 Units Subcutaneous TID WC   insulin aspart  0-5 Units Subcutaneous QHS   isosorbide mononitrate  30 mg Oral Daily   sacubitril-valsartan  1 tablet Oral BID   sodium chloride flush  3 mL Intravenous Q12H   spironolactone  25 mg Oral Daily   Continuous Infusions:  PRN Meds: acetaminophen **OR** acetaminophen, albuterol, HYDROcodone-acetaminophen, polyethylene glycol   Vital Signs    Vitals:   04/25/23 0814 04/25/23 1235 04/25/23 1920 04/26/23 0455  BP: 131/78 126/73 (!) 144/89 (!) 154/87  Pulse: 83  88 90  Resp: 16 17 18 16   Temp:  98.1 F (36.7 C)  97.9 F (36.6 C)  TempSrc:  Oral  Oral  SpO2: 94%  96% 97%  Weight:      Height:        Intake/Output Summary (Last 24 hours) at 04/26/2023 0943 Last data filed at 04/26/2023 0600 Gross per 24 hour  Intake 240 ml  Output 2000 ml  Net -1760 ml      04/24/2023    5:07 PM 04/24/2023   10:12 AM 01/27/2018    9:37 AM  Last 3 Weights  Weight (lbs) 160 lb 9.6 oz 171 lb 178 lb  Weight (kg) 72.848 kg 77.565 kg 80.74 kg      Telemetry    very rare missed beats and PVCs, one episode of Wenkebach - Personally Reviewed  ECG    No new since 8/4 - Personally Reviewed  Physical Exam   GEN: No acute distress.   Neck: No JVD Cardiac: RRR, no murmurs, rubs, or gallops.  Respiratory: Clear to auscultation bilaterally. GI: Soft, nontender, non-distended  MS: No edema; No deformity. Neuro:  Nonfocal  Psych: Normal affect   Labs    High Sensitivity Troponin:   Recent Labs  Lab 04/24/23 1027  04/24/23 1155  TROPONINIHS 17 18*     Chemistry Recent Labs  Lab 04/24/23 1027 04/24/23 1732 04/25/23 0135  NA 139  --   --   K 3.9  --  3.5  CL 106  --   --   CO2 23  --   --   GLUCOSE 106*  --   --   BUN 7*  --   --   CREATININE 1.11 1.03  --   CALCIUM 8.8*  --   --   MG  --   --  2.1  PROT  --   --  6.5  ALBUMIN  --   --  3.1*  AST  --   --  21  ALT  --   --  29  ALKPHOS  --   --  72  BILITOT  --   --  1.5*  GFRNONAA >60 >60  --   ANIONGAP 10  --   --     Lipids No results for input(s): "CHOL", "TRIG", "HDL", "LABVLDL", "LDLCALC", "CHOLHDL" in the last 168 hours.  Hematology Recent Labs  Lab 04/24/23 1027 04/24/23 1732  WBC 6.1 7.8  RBC 4.64 4.97  HGB 11.4* 12.4*  HCT 37.8* 40.6  MCV 81.5 81.7  MCH 24.6* 24.9*  MCHC 30.2 30.5  RDW 17.1* 17.0*  PLT 374 400   Thyroid  Recent Labs  Lab 04/25/23 0135  TSH 3.002    BNP Recent Labs  Lab 04/24/23 1027  BNP 426.2*    DDimer  Recent Labs  Lab 04/24/23 1028  DDIMER 0.68*     Radiology    VAS [place] LOWER EXTREMITY VENOUS (DVT)  Result Date: 04/25/2023  Lower Venous DVT Study Patient Name:  Donald Leblanc  Date of Exam:   04/25/2023 Medical Rec #: [id number]        Accession #:    [id number] Date of Birth: June 15, 1956       Patient Gender: M Patient Age:   82 years Exam Location:  [facility] Procedure:      VAS [place] LOWER EXTREMITY VENOUS (DVT) Referring Phys: [facility] GOEL --------------------------------------------------------------------------------  Indications: Ordered for edema; however, multiple notes stating no edema. History of CHF/SOB.  Comparison Study: No prior studies. Performing Technologist: Jean Rosenthal RDMS, RVT  Examination Guidelines: A complete evaluation includes B-mode imaging, spectral Doppler, color Doppler, and power Doppler as needed of all accessible portions of each vessel. Bilateral testing is considered an integral part of a complete examination. Limited examinations for  reoccurring indications may be performed as noted. The reflux portion of the exam is performed with the patient in reverse Trendelenburg.  +---------+---------------+---------+-----------+----------+--------------+ RIGHT    CompressibilityPhasicitySpontaneityPropertiesThrombus Aging +---------+---------------+---------+-----------+----------+--------------+ CFV      Full           Yes      Yes                                 +---------+---------------+---------+-----------+----------+--------------+ SFJ      Full                                                        +---------+---------------+---------+-----------+----------+--------------+ FV Prox  Full                                                        +---------+---------------+---------+-----------+----------+--------------+ FV Mid   Full                                                        +---------+---------------+---------+-----------+----------+--------------+ FV DistalFull                                                        +---------+---------------+---------+-----------+----------+--------------+ PFV      Full                                                        +---------+---------------+---------+-----------+----------+--------------+  POP      Full           Yes      Yes                                 +---------+---------------+---------+-----------+----------+--------------+ PTV      Full                                                        +---------+---------------+---------+-----------+----------+--------------+ PERO     Full                                                        +---------+---------------+---------+-----------+----------+--------------+   +---------+---------------+---------+-----------+----------+--------------+ LEFT     CompressibilityPhasicitySpontaneityPropertiesThrombus Aging  +---------+---------------+---------+-----------+----------+--------------+ CFV      Full           Yes      Yes                                 +---------+---------------+---------+-----------+----------+--------------+ SFJ      Full                                                        +---------+---------------+---------+-----------+----------+--------------+ FV Prox  Full                                                        +---------+---------------+---------+-----------+----------+--------------+ FV Mid   Full                                                        +---------+---------------+---------+-----------+----------+--------------+ FV DistalFull                                                        +---------+---------------+---------+-----------+----------+--------------+ PFV      Full                                                        +---------+---------------+---------+-----------+----------+--------------+ POP      Full           Yes      Yes                                 +---------+---------------+---------+-----------+----------+--------------+  PTV      Full                                                        +---------+---------------+---------+-----------+----------+--------------+ PERO     Full                                                        +---------+---------------+---------+-----------+----------+--------------+     Summary: RIGHT: - There is no evidence of deep vein thrombosis in the lower extremity.  - No cystic structure found in the popliteal fossa.  LEFT: - There is no evidence of deep vein thrombosis in the lower extremity.  - No cystic structure found in the popliteal fossa.  *See table(s) above for measurements and observations. Electronically signed by Heath Lark on 04/25/2023 at 5:01:29 PM.    Final    ECHOCARDIOGRAM COMPLETE  Result Date: 04/24/2023    ECHOCARDIOGRAM REPORT   Patient Name:    Nikalas Chimento Date of Exam: 04/24/2023 Medical Rec #:  [id number]       Height:       70.5 in Accession #:    [id number]      Weight:       171.0 lb Date of Birth:  19-Mar-1956      BSA:          1.963 m Patient Age:    66 years        BP:           158/97 mmHg Patient Gender: M               HR:           80 bpm. Exam Location:  Inpatient Procedure: 2D Echo, Cardiac Doppler and Color Doppler Indications:    CHF-Acute Systolic I50.21  History:        Patient has no prior history of Echocardiogram examinations.                 CAD; COPD. Hx of substance abuse (HCC) (From Hx). CABG April                 2024 per patient.  Sonographer:    Celesta Gentile RCS Referring Phys: [facility] GOEL IMPRESSIONS  1. Left ventricular ejection fraction, by estimation, is 25 to 30%. The left ventricle has severely decreased function. The left ventricle demonstrates global hypokinesis. The left ventricular internal cavity size was mildly to moderately dilated. Left ventricular diastolic parameters are consistent with Grade III diastolic dysfunction (restrictive).  2. Right ventricular systolic function is severely reduced. The right ventricular size is normal.  3. Left atrial size was moderately dilated.  4. Right atrial size was moderately dilated.  5. The mitral valve is normal in structure. Moderate to severe mitral valve regurgitation. No evidence of mitral stenosis.  6. The aortic valve is normal in structure. Aortic valve regurgitation is not visualized. No aortic stenosis is present.  7. The inferior vena cava is normal in size with greater than 50% respiratory variability, suggesting right atrial pressure of 3 mmHg. FINDINGS  Left Ventricle: Left ventricular ejection fraction, by estimation, is 25 to 30%. The left  ventricle has severely decreased function. The left ventricle demonstrates global hypokinesis. Definity contrast agent was given IV to delineate the left ventricular endocardial borders. The left ventricular internal cavity  size was mildly to moderately dilated. There is no left ventricular hypertrophy. Left ventricular diastolic parameters are consistent with Grade III diastolic dysfunction (restrictive). Right Ventricle: The right ventricular size is normal. No increase in right ventricular wall thickness. Right ventricular systolic function is severely reduced. Left Atrium: Left atrial size was moderately dilated. Right Atrium: Right atrial size was moderately dilated. Pericardium: There is no evidence of pericardial effusion. Mitral Valve: The mitral valve is normal in structure. Moderate to severe mitral valve regurgitation, with centrally-directed jet. No evidence of mitral valve stenosis. Tricuspid Valve: The tricuspid valve is normal in structure. Tricuspid valve regurgitation is mild . No evidence of tricuspid stenosis. Aortic Valve: The aortic valve is normal in structure. Aortic valve regurgitation is not visualized. No aortic stenosis is present. Pulmonic Valve: The pulmonic valve was not well visualized. Pulmonic valve regurgitation is trivial. No evidence of pulmonic stenosis. Aorta: The aortic root is normal in size and structure. Venous: The inferior vena cava is normal in size with greater than 50% respiratory variability, suggesting right atrial pressure of 3 mmHg. IAS/Shunts: No atrial level shunt detected by color flow Doppler.  LEFT VENTRICLE PLAX 2D LVIDd:         5.00 cm      Diastology LVIDs:         4.40 cm      LV e' medial:    4.57 cm/s LV PW:         0.90 cm      LV E/e' medial:  27.1 LV IVS:        1.00 cm      LV e' lateral:   4.57 cm/s LVOT diam:     1.60 cm      LV E/e' lateral: 27.1 LV SV:         27 LV SV Index:   14 LVOT Area:     2.01 cm  LV Volumes (MOD) LV vol d, MOD A2C: 119.0 ml LV vol d, MOD A4C: 104.0 ml LV vol s, MOD A2C: 75.0 ml LV vol s, MOD A4C: 75.3 ml LV SV MOD A2C:     44.0 ml LV SV MOD A4C:     104.0 ml LV SV MOD BP:      38.6 ml RIGHT VENTRICLE RV S prime:     6.20 cm/s TAPSE  (M-mode): 0.9 cm LEFT ATRIUM             Index        RIGHT ATRIUM           Index LA diam:        3.90 cm 1.99 cm/m   RA Area:     14.60 cm LA Vol (A2C):   47.1 ml 23.99 ml/m  RA Volume:   35.80 ml  18.24 ml/m LA Vol (A4C):   58.5 ml 29.80 ml/m LA Biplane Vol: 57.3 ml 29.19 ml/m  AORTIC VALVE LVOT Vmax:   68.70 cm/s LVOT Vmean:  45.400 cm/s LVOT VTI:    0.132 m  AORTA Ao Root diam: 3.00 cm MITRAL VALVE                TRICUSPID VALVE MV Area (PHT): 7.44 cm     TR Peak grad:   36.0 mmHg MV Decel Time: 102 msec  TR Vmax:        300.00 cm/s MR Peak grad: 91.0 mmHg MR Mean grad: 59.0 mmHg     SHUNTS MR Vmax:      477.00 cm/s   Systemic VTI:  0.13 m MR Vmean:     366.0 cm/s    Systemic Diam: 1.60 cm MV E velocity: 124.00 cm/s MV A velocity: 56.60 cm/s MV E/A ratio:  2.19 Arvilla Meres MD Electronically signed by Arvilla Meres MD Signature Date/Time: 04/24/2023/7:22:28 PM    Final    CT CHEST WO CONTRAST  Result Date: 04/24/2023 CLINICAL DATA:  Diffuse interstitial lung disease EXAM: CT CHEST WITHOUT CONTRAST TECHNIQUE: Multidetector CT imaging of the chest was performed following the standard protocol without IV contrast. RADIATION DOSE REDUCTION: This exam was performed according to the departmental dose-optimization program which includes automated exposure control, adjustment of the mA and/or kV according to patient size and/or use of iterative reconstruction technique. COMPARISON:  06/03/2009.  Chest x-ray today FINDINGS: Cardiovascular: Heart is mildly enlarged. Prior CABG. Aortic atherosclerosis. No aneurysm. Mediastinum/Nodes: Borderline mediastinal lymph nodes. Prevascular lymph node has a short axis diameter of 9 mm. Right paratracheal lymph node has a short axis diameter of 9 mm. No visible hilar or axillary adenopathy. Trachea and esophagus are unremarkable. Thyroid unremarkable. Lungs/Pleura: Bronchiectasis and cystic changes/scarring in the right upper lobe and superior segment of the right  lower lobe, unchanged. Right calcified pleural plaques in the lower right chest. Small left pleural effusion. No confluent airspace opacities on the left. Upper Abdomen: No acute findings Musculoskeletal: Chest wall soft tissues are unremarkable. No acute bony abnormality. IMPRESSION: Stable bronchiectasis and scarring in the right upper lobe and right lower lobe. Pleural thickening and calcified pleural plaques on the right, stable. Small left pleural effusion. Cardiomegaly. Aortic Atherosclerosis (ICD10-I70.0). Electronically Signed   By: Charlett Nose M.D.   On: 04/24/2023 17:06   DG Chest 1 View  Result Date: 04/24/2023 CLINICAL DATA:  SOB EXAM: CHEST  1 VIEW COMPARISON:  CXR 01/14/18 FINDINGS: Status post median sternotomy and CABG. Cardiomegaly.No pleural effusion. No pneumothorax. Unchanged linear opacity in the right upper lobe. There is a new hazy opacity at the right lung base, which could represent atelectasis or infection. No radiographically apparent displaced rib fractures. Visualized upper abdomen is unremarkable. IMPRESSION: 1. New hazy opacity at the right lung base, which could represent atelectasis or infection. 2. Cardiomegaly. If there is clinical concern for a pericardial effusion, further evaluation with echocardiography is recommended. Electronically Signed   By: Lorenza Cambridge M.D.   On: 04/24/2023 11:14    Cardiac Studies   Echo 04/24/23 reviewed  Patient Profile     67 y.o. male with PMH CAD s/p 4V CABG (12/21/22, at [facility]), PAD, COPD presenting after lightheaded spell.   Assessment & Plan    Near syncope Second degree AV block -holding beta blocker -was outside working in the heat, had not had anything to eat in >12 hours, felt lightheaded and short of breath, had to sit down. No full loss of consciousness -high suspicion for dehydration/vagal episode, but continue to monitor on telemetry -ECG from 04/24/23 at 23:08 shows brief 2:1 AV block and then PVC. Patient sleeping at the  time -ECG from 04/25/23 1:10 AM shows both Wenkebach and 2:1 block -telemetry with very rare missed beats and PVCs, one episode of Wenkebach. No high degree conduction disease seen in last 24 hours.  Reduced EF -EF 25-30%, was 45-50% after CABG in 12/2022 -RV function  also severely reduced -moderate-severe MR now (was mild 12/2022) -no beta blocker given bradycardia/near syncope as above -global hypokinesis, but with drop in EF, discussed LHC. hsTn 17, 18. D-dimer 0.68, has not had CTPE but DVT study negative -on imdur, continue -started on entresto, spironolactone, monitor BP, K, Cr. Labs not available yet this AM. -clinically euvolemic but also net negative >2L without specific diuresis  CAD s/p CABGx4 12/21/22 ([facility]) Hypercholesterolemia -continue aspirin, clopidogrel -continue atorvastatin 40 mg daily. LDL 12/21/22 was 139 -reports DOE both prior to and after his CABG. hsTn flat but as above, discussed LHC to evaluate grafts. He is NPO and is amenable.  Informed Consent   Shared Decision Making/Informed Consent The risks [stroke (1 in 1000), death (1 in 1000), kidney failure [usually temporary] (1 in 500), bleeding (1 in 200), allergic reaction [possibly serious] (1 in 200)], benefits (diagnostic support and management of coronary artery disease) and alternatives of a cardiac catheterization were discussed in detail with Mr. Lamberth and he is willing to proceed.      For questions or updates, please contact [facility] Please consult [email or web address] for contact info under        Signed, Jodelle Red, MD  04/26/2023, 9:43 AM

## 2023-04-26 NOTE — Interval H&P Note (Signed)
History and Physical Interval Note:  04/26/2023 2:32 PM  Cuthbert Provost  has presented today for surgery, with the diagnosis of heart failure.  The various methods of treatment have been discussed with the patient and family. After consideration of risks, benefits and other options for treatment, the patient has consented to  Procedure(s): RIGHT/LEFT HEART CATH AND CORONARY/GRAFT ANGIOGRAPHY (N/A) as a surgical intervention.  The patient's history has been reviewed, patient examined, no change in status, stable for surgery.  I have reviewed the patient's chart and labs.  Questions were answered to the patient's satisfaction.    Cath Lab Visit (complete for each Cath Lab visit)  Clinical Evaluation Leading to the Procedure:   ACS: Yes.    Non-ACS:    Anginal Classification: CCS II  Anti-ischemic medical therapy: Maximal Therapy (2 or more classes of medications)  Non-Invasive Test Results: No non-invasive testing performed  Prior CABG: Previous CABG        Verne Carrow

## 2023-04-26 NOTE — Progress Notes (Signed)
   Heart Failure Stewardship Pharmacist Progress Note   PCP: Sondra Come, MD PCP-Cardiologist: None    HPI:  67 yo M with PMH of CAD s/p CABG 12/2022, HTN, HLD, COPD, PVD, and seizures.   Presented to the ED on 8/3 with near syncopal event. Denied chest pain, palpitations, or acute shortness of breath. Since CABG, he has had exertional shortness of breath. EKG initially in NSR, first AV block. Second EKG with 2-1 block with PVC. Repeat EKG with 2-1 likely Wenckebach/Mobitz 1. Cardiology consulted. ECHO 8/3 showed LVEF 25-30%, global hypokinesis, G3DD, RV severely reduced, LA and RA moderately dilated, moderate to severe MR. Plan for [facility] on 8/5.  Current HF Medications: ACE/ARB/ARNI: Entresto 24/26 mg BID MRA: spironolactone 25 mg daily  Prior to admission HF Medications: Beta blocker: metoprolol tartrate 12.5 mg BID  Pertinent Lab Values: As of 8/3: Serum creatinine 1.03, BUN 7, Potassium 3.5, Sodium 139, BNP 426.2, Magnesium 2.1, A1c 6.3   Vital Signs: Weight: 160 lbs (admission weight: 160 lbs) Blood pressure: 130/80s  Heart rate: 90s  I/O: net -0.7L yesterday; net -2.7L since admission  Medication Assistance / Insurance Benefits Check: Does the patient have prescription insurance?  Yes Type of insurance plan: [facility] + Medicaid  Outpatient Pharmacy:  Prior to admission outpatient pharmacy: [facility] Is the patient willing to use [facility] TOC pharmacy at discharge? Yes Is the patient willing to transition their outpatient pharmacy to utilize a [facility] outpatient pharmacy?   Pending    Assessment: 1. Acute systolic CHF (LVEF 20-25%), pending ischemic evaluation. NYHA class II symptoms. BMET pending today. - Not volume overloaded on exam. Can confirm with [facility] today. - Holding metoprolol with bradycardia and near syncope, second degree AV block. Also with new severe RV dysfunction. - Continue Entresto 24/26 mg BID - Continue spironolactone 25 mg daily - Consider  adding SGLT2i prior to discharge  Plan: 1) Medication changes recommended at this time: - None pending cath and BMET today  2) Patient assistance: - Entresto copay $0 - Farxiga/Jardiance copay $0  3)  Education  - To be completed prior to discharge - patient asleep and unable to complete education at this time  Sharen Hones, PharmD, BCPS Heart Failure Engineer, building services Phone [phone]

## 2023-04-26 NOTE — Progress Notes (Signed)
PROGRESS NOTE    Donald Leblanc  ZOX:[id number] DOB: 16-Nov-1955 DOA: 04/24/2023 PCP: Sondra Come, MD   Brief Narrative:  Donald Leblanc is a 67 y.o. male with a hx of CAD s/p CABG x4 April 2024 (LIMA-LAD, SVG- PDA, SVG- OM, SVG-D1), PAD, COPD, HTN, HLD who presented to ED with shortness of breath as well as near syncope episodes.  After admission, patient was diagnosed with Mobitz 2 heart block as well as bradycardia.  Cardiology was consulted.   Assessment & Plan:   Principal Problem:   Near syncope Active Problems:   Abnormal CXR   CAD (coronary artery disease)   Acute systolic (congestive) heart failure (HCC)  Near syncope: Could be cardiac in origin.  Has Mobitz type II heart block.  Beta-blocker discontinued.  Cardiology on board and managing.  History of CAD with CABG x 4 at [facility] April 2024/new onset systolic congestive heart failure: Comes in with some shortness of breath.  Echo now shows 25 to 30% ejection fraction.  Seen by cardiology, has been started on Imdur, Entresto and Aldactone.  Plan for cardiac cath today.  On Plavix.  Hyperlipidemia: Continue atorvastatin.  History of COPD: Stable.  DVT prophylaxis: enoxaparin (LOVENOX) injection 40 mg Start: 04/25/23 0800 SCDs Start: 04/24/23 1318   Code Status: Full Code  Family Communication:  None present at bedside.  Plan of care discussed with patient in length and he/she verbalized understanding and agreed with it.  Status is: Inpatient Remains inpatient appropriate because: Planned for cardiac cath most likely today.     Estimated body mass index is 22.72 kg/m as calculated from the following:   Height as of this encounter: 5' 10.5" (1.791 m).   Weight as of this encounter: 72.8 kg.    Nutritional Assessment: Body mass index is 22.72 kg/m.Marland Kitchen Seen by dietician.  I agree with the assessment and plan as outlined below: Nutrition Status:        . Skin Assessment: I have examined the patient's skin  and I agree with the wound assessment as performed by the wound care RN as outlined below:    Consultants:  Cardiology  Procedures:  As above  Antimicrobials:  Anti-infectives (From admission, onward)    None         Subjective: Seen and examined.  No complaints.  Objective: Vitals:   04/25/23 0814 04/25/23 1235 04/25/23 1920 04/26/23 0455  BP: 131/78 126/73 (!) 144/89 (!) 154/87  Pulse: 83  88 90  Resp: 16 17 18 16   Temp:  98.1 F (36.7 C)  97.9 F (36.6 C)  TempSrc:  Oral  Oral  SpO2: 94%  96% 97%  Weight:      Height:        Intake/Output Summary (Last 24 hours) at 04/26/2023 1127 Last data filed at 04/26/2023 1000 Gross per 24 hour  Intake 240 ml  Output 2400 ml  Net -2160 ml   Filed Weights   04/24/23 1012 04/24/23 1707  Weight: 77.6 kg 72.8 kg    Examination:  General exam: Appears calm and comfortable  Respiratory system: Clear to auscultation. Respiratory effort normal. Cardiovascular system: S1 & S2 heard, RRR. No JVD, murmurs, rubs, gallops or clicks. No pedal edema. Gastrointestinal system: Abdomen is nondistended, soft and nontender. No organomegaly or masses felt. Normal bowel sounds heard. Central nervous system: Alert and oriented. No focal neurological deficits. Extremities: Symmetric 5 x 5 power. Skin: No rashes, lesions or ulcers.  Psychiatry: Judgement and insight appear normal. Mood &  affect appropriate.   Data Reviewed: I have personally reviewed following labs and imaging studies  CBC: Recent Labs  Lab 04/24/23 1027 04/24/23 1732  WBC 6.1 7.8  HGB 11.4* 12.4*  HCT 37.8* 40.6  MCV 81.5 81.7  PLT 374 400   Basic Metabolic Panel: Recent Labs  Lab 04/24/23 1027 04/24/23 1732 04/25/23 0135  NA 139  --   --   K 3.9  --  3.5  CL 106  --   --   CO2 23  --   --   GLUCOSE 106*  --   --   BUN 7*  --   --   CREATININE 1.11 1.03  --   CALCIUM 8.8*  --   --   MG  --   --  2.1   GFR: Estimated Creatinine Clearance: 72.6  mL/min (by C-G formula based on SCr of 1.03 mg/dL). Liver Function Tests: Recent Labs  Lab 04/25/23 0135  AST 21  ALT 29  ALKPHOS 72  BILITOT 1.5*  PROT 6.5  ALBUMIN 3.1*   No results for input(s): "LIPASE", "AMYLASE" in the last 168 hours. No results for input(s): "AMMONIA" in the last 168 hours. Coagulation Profile: Recent Labs  Lab 04/25/23 0135  INR 1.2   Cardiac Enzymes: No results for input(s): "CKTOTAL", "CKMB", "CKMBINDEX", "TROPONINI" in the last 168 hours. BNP (last 3 results) No results for input(s): "PROBNP" in the last 8760 hours. HbA1C: Recent Labs    04/24/23 1732  HGBA1C 6.3*   CBG: Recent Labs  Lab 04/25/23 0747 04/25/23 1150 04/25/23 1646 04/25/23 2123 04/26/23 0759  GLUCAP 103* 89 110* 119* 116*   Lipid Profile: No results for input(s): "CHOL", "HDL", "LDLCALC", "TRIG", "CHOLHDL", "LDLDIRECT" in the last 72 hours. Thyroid Function Tests: Recent Labs    04/25/23 0135  TSH 3.002   Anemia Panel: No results for input(s): "VITAMINB12", "FOLATE", "FERRITIN", "TIBC", "IRON", "RETICCTPCT" in the last 72 hours. Sepsis Labs: No results for input(s): "PROCALCITON", "LATICACIDVEN" in the last 168 hours.  No results found for this or any previous visit (from the past 240 hour(s)).   Radiology Studies: VAS [place] LOWER EXTREMITY VENOUS (DVT)  Result Date: 04/25/2023  Lower Venous DVT Study Patient Name:  Donald Leblanc  Date of Exam:   04/25/2023 Medical Rec #: [id number]        Accession #:    [id number] Date of Birth: [birth year]       Patient Gender: M Patient Age:   43 years Exam Location:  [facility] Procedure:      VAS [place] LOWER EXTREMITY VENOUS (DVT) Referring Phys: [facility] GOEL --------------------------------------------------------------------------------  Indications: Ordered for edema; however, multiple notes stating no edema. History of CHF/SOB.  Comparison Study: No prior studies. Performing Technologist: Jean Rosenthal RDMS, RVT  Examination  Guidelines: A complete evaluation includes B-mode imaging, spectral Doppler, color Doppler, and power Doppler as needed of all accessible portions of each vessel. Bilateral testing is considered an integral part of a complete examination. Limited examinations for reoccurring indications may be performed as noted. The reflux portion of the exam is performed with the patient in reverse Trendelenburg.  +---------+---------------+---------+-----------+----------+--------------+ RIGHT    CompressibilityPhasicitySpontaneityPropertiesThrombus Aging +---------+---------------+---------+-----------+----------+--------------+ CFV      Full           Yes      Yes                                 +---------+---------------+---------+-----------+----------+--------------+  SFJ      Full                                                        +---------+---------------+---------+-----------+----------+--------------+ FV Prox  Full                                                        +---------+---------------+---------+-----------+----------+--------------+ FV Mid   Full                                                        +---------+---------------+---------+-----------+----------+--------------+ FV DistalFull                                                        +---------+---------------+---------+-----------+----------+--------------+ PFV      Full                                                        +---------+---------------+---------+-----------+----------+--------------+ POP      Full           Yes      Yes                                 +---------+---------------+---------+-----------+----------+--------------+ PTV      Full                                                        +---------+---------------+---------+-----------+----------+--------------+ PERO     Full                                                         +---------+---------------+---------+-----------+----------+--------------+   +---------+---------------+---------+-----------+----------+--------------+ LEFT     CompressibilityPhasicitySpontaneityPropertiesThrombus Aging +---------+---------------+---------+-----------+----------+--------------+ CFV      Full           Yes      Yes                                 +---------+---------------+---------+-----------+----------+--------------+ SFJ      Full                                                        +---------+---------------+---------+-----------+----------+--------------+  FV Prox  Full                                                        +---------+---------------+---------+-----------+----------+--------------+ FV Mid   Full                                                        +---------+---------------+---------+-----------+----------+--------------+ FV DistalFull                                                        +---------+---------------+---------+-----------+----------+--------------+ PFV      Full                                                        +---------+---------------+---------+-----------+----------+--------------+ POP      Full           Yes      Yes                                 +---------+---------------+---------+-----------+----------+--------------+ PTV      Full                                                        +---------+---------------+---------+-----------+----------+--------------+ PERO     Full                                                        +---------+---------------+---------+-----------+----------+--------------+     Summary: RIGHT: - There is no evidence of deep vein thrombosis in the lower extremity.  - No cystic structure found in the popliteal fossa.  LEFT: - There is no evidence of deep vein thrombosis in the lower extremity.  - No cystic structure found in the popliteal fossa.   *See table(s) above for measurements and observations. Electronically signed by Heath Lark on 04/25/2023 at 5:01:29 PM.    Final    ECHOCARDIOGRAM COMPLETE  Result Date: 04/24/2023    ECHOCARDIOGRAM REPORT   Patient Name:   Jawuan Heagerty Date of Exam: 04/24/2023 Medical Rec #:  [id number]       Height:       70.5 in Accession #:    [id number]      Weight:       171.0 lb Date of Birth:  [birth year]      BSA:          1.963 m Patient Age:    22 years  BP:           158/97 mmHg Patient Gender: M               HR:           80 bpm. Exam Location:  Inpatient Procedure: 2D Echo, Cardiac Doppler and Color Doppler Indications:    CHF-Acute Systolic I50.21  History:        Patient has no prior history of Echocardiogram examinations.                 CAD; COPD. Hx of substance abuse (HCC) (From Hx). CABG April                 2024 per patient.  Sonographer:    Celesta Gentile RCS Referring Phys: [facility] GOEL IMPRESSIONS  1. Left ventricular ejection fraction, by estimation, is 25 to 30%. The left ventricle has severely decreased function. The left ventricle demonstrates global hypokinesis. The left ventricular internal cavity size was mildly to moderately dilated. Left ventricular diastolic parameters are consistent with Grade III diastolic dysfunction (restrictive).  2. Right ventricular systolic function is severely reduced. The right ventricular size is normal.  3. Left atrial size was moderately dilated.  4. Right atrial size was moderately dilated.  5. The mitral valve is normal in structure. Moderate to severe mitral valve regurgitation. No evidence of mitral stenosis.  6. The aortic valve is normal in structure. Aortic valve regurgitation is not visualized. No aortic stenosis is present.  7. The inferior vena cava is normal in size with greater than 50% respiratory variability, suggesting right atrial pressure of 3 mmHg. FINDINGS  Left Ventricle: Left ventricular ejection fraction, by estimation, is 25 to 30%. The  left ventricle has severely decreased function. The left ventricle demonstrates global hypokinesis. Definity contrast agent was given IV to delineate the left ventricular endocardial borders. The left ventricular internal cavity size was mildly to moderately dilated. There is no left ventricular hypertrophy. Left ventricular diastolic parameters are consistent with Grade III diastolic dysfunction (restrictive). Right Ventricle: The right ventricular size is normal. No increase in right ventricular wall thickness. Right ventricular systolic function is severely reduced. Left Atrium: Left atrial size was moderately dilated. Right Atrium: Right atrial size was moderately dilated. Pericardium: There is no evidence of pericardial effusion. Mitral Valve: The mitral valve is normal in structure. Moderate to severe mitral valve regurgitation, with centrally-directed jet. No evidence of mitral valve stenosis. Tricuspid Valve: The tricuspid valve is normal in structure. Tricuspid valve regurgitation is mild . No evidence of tricuspid stenosis. Aortic Valve: The aortic valve is normal in structure. Aortic valve regurgitation is not visualized. No aortic stenosis is present. Pulmonic Valve: The pulmonic valve was not well visualized. Pulmonic valve regurgitation is trivial. No evidence of pulmonic stenosis. Aorta: The aortic root is normal in size and structure. Venous: The inferior vena cava is normal in size with greater than 50% respiratory variability, suggesting right atrial pressure of 3 mmHg. IAS/Shunts: No atrial level shunt detected by color flow Doppler.  LEFT VENTRICLE PLAX 2D LVIDd:         5.00 cm      Diastology LVIDs:         4.40 cm      LV e' medial:    4.57 cm/s LV PW:         0.90 cm      LV E/e' medial:  27.1 LV IVS:        1.00 cm  LV e' lateral:   4.57 cm/s LVOT diam:     1.60 cm      LV E/e' lateral: 27.1 LV SV:         27 LV SV Index:   14 LVOT Area:     2.01 cm  LV Volumes (MOD) LV vol d, MOD A2C:  119.0 ml LV vol d, MOD A4C: 104.0 ml LV vol s, MOD A2C: 75.0 ml LV vol s, MOD A4C: 75.3 ml LV SV MOD A2C:     44.0 ml LV SV MOD A4C:     104.0 ml LV SV MOD BP:      38.6 ml RIGHT VENTRICLE RV S prime:     6.20 cm/s TAPSE (M-mode): 0.9 cm LEFT ATRIUM             Index        RIGHT ATRIUM           Index LA diam:        3.90 cm 1.99 cm/m   RA Area:     14.60 cm LA Vol (A2C):   47.1 ml 23.99 ml/m  RA Volume:   35.80 ml  18.24 ml/m LA Vol (A4C):   58.5 ml 29.80 ml/m LA Biplane Vol: 57.3 ml 29.19 ml/m  AORTIC VALVE LVOT Vmax:   68.70 cm/s LVOT Vmean:  45.400 cm/s LVOT VTI:    0.132 m  AORTA Ao Root diam: 3.00 cm MITRAL VALVE                TRICUSPID VALVE MV Area (PHT): 7.44 cm     TR Peak grad:   36.0 mmHg MV Decel Time: 102 msec     TR Vmax:        300.00 cm/s MR Peak grad: 91.0 mmHg MR Mean grad: 59.0 mmHg     SHUNTS MR Vmax:      477.00 cm/s   Systemic VTI:  0.13 m MR Vmean:     366.0 cm/s    Systemic Diam: 1.60 cm MV E velocity: 124.00 cm/s MV A velocity: 56.60 cm/s MV E/A ratio:  2.19 Arvilla Meres MD Electronically signed by Arvilla Meres MD Signature Date/Time: 04/24/2023/7:22:28 PM    Final    CT CHEST WO CONTRAST  Result Date: 04/24/2023 CLINICAL DATA:  Diffuse interstitial lung disease EXAM: CT CHEST WITHOUT CONTRAST TECHNIQUE: Multidetector CT imaging of the chest was performed following the standard protocol without IV contrast. RADIATION DOSE REDUCTION: This exam was performed according to the departmental dose-optimization program which includes automated exposure control, adjustment of the mA and/or kV according to patient size and/or use of iterative reconstruction technique. COMPARISON:  06/03/2009.  Chest x-ray today FINDINGS: Cardiovascular: Heart is mildly enlarged. Prior CABG. Aortic atherosclerosis. No aneurysm. Mediastinum/Nodes: Borderline mediastinal lymph nodes. Prevascular lymph node has a short axis diameter of 9 mm. Right paratracheal lymph node has a short axis diameter of 9  mm. No visible hilar or axillary adenopathy. Trachea and esophagus are unremarkable. Thyroid unremarkable. Lungs/Pleura: Bronchiectasis and cystic changes/scarring in the right upper lobe and superior segment of the right lower lobe, unchanged. Right calcified pleural plaques in the lower right chest. Small left pleural effusion. No confluent airspace opacities on the left. Upper Abdomen: No acute findings Musculoskeletal: Chest wall soft tissues are unremarkable. No acute bony abnormality. IMPRESSION: Stable bronchiectasis and scarring in the right upper lobe and right lower lobe. Pleural thickening and calcified pleural plaques on the right, stable. Small left pleural effusion. Cardiomegaly.  Aortic Atherosclerosis (ICD10-I70.0). Electronically Signed   By: Charlett Nose M.D.   On: 04/24/2023 17:06    Scheduled Meds:  aspirin  81 mg Oral Daily   aspirin  81 mg Oral Pre-Cath   atorvastatin  40 mg Oral Daily   clopidogrel  75 mg Oral Daily   enoxaparin (LOVENOX) injection  40 mg Subcutaneous Q24H   insulin aspart  0-15 Units Subcutaneous TID WC   insulin aspart  0-5 Units Subcutaneous QHS   isosorbide mononitrate  30 mg Oral Daily   sacubitril-valsartan  1 tablet Oral BID   sodium chloride flush  3 mL Intravenous Q12H   spironolactone  25 mg Oral Daily   Continuous Infusions:  sodium chloride       LOS: 1 day   Hughie Closs, MD [facility]  04/26/2023, 11:27 AM   *Please note that this is a verbal dictation therefore any spelling or grammatical errors are due to the "[employer]agon [employer] One" system interpretation.  Please page via Amion and do not message via secure chat for urgent patient care matters. Secure chat can be used for non urgent patient care matters.  How to contact the [facility] Attending or Consulting provider 7A - 7P or covering provider during after hours 7P -7A, for this patient?  Check the care team in [facility] and look for a) attending/consulting [facility] provider listed and b) the  [facility] team listed. Page or secure chat 7A-7P. Log into [email or web address] and use [place]'s universal password to access. If you do not have the password, please contact the hospital operator. Locate the [facility] provider you are looking for under [facility] and page to a number that you can be directly reached. If you still have difficulty reaching the provider, please page the [facility] (Director on Call) for the Hospitalists listed on amion for assistance.

## 2023-04-26 NOTE — Progress Notes (Signed)
VAST consult received to obtain IV access for cardiac cath sometime today. Upon availability to assess patient's vasculature, patient had already been taken to cath lab.

## 2023-04-26 NOTE — H&P (View-Only) (Signed)
Rounding Note    Patient Name: Donald Leblanc Date of Encounter: 04/26/2023  [facility] Cardiologist: [facility]  Subjective   No acute events overnight. He is NPO and wants to proceed with cath.  Inpatient Medications    Scheduled Meds:  aspirin  81 mg Oral Daily   atorvastatin  40 mg Oral Daily   clopidogrel  75 mg Oral Daily   enoxaparin (LOVENOX) injection  40 mg Subcutaneous Q24H   insulin aspart  0-15 Units Subcutaneous TID WC   insulin aspart  0-5 Units Subcutaneous QHS   isosorbide mononitrate  30 mg Oral Daily   sacubitril-valsartan  1 tablet Oral BID   sodium chloride flush  3 mL Intravenous Q12H   spironolactone  25 mg Oral Daily   Continuous Infusions:  PRN Meds: acetaminophen **OR** acetaminophen, albuterol, HYDROcodone-acetaminophen, polyethylene glycol   Vital Signs    Vitals:   04/25/23 0814 04/25/23 1235 04/25/23 1920 04/26/23 0455  BP: 131/78 126/73 (!) 144/89 (!) 154/87  Pulse: 83  88 90  Resp: 16 17 18 16   Temp:  98.1 F (36.7 C)  97.9 F (36.6 C)  TempSrc:  Oral  Oral  SpO2: 94%  96% 97%  Weight:      Height:        Intake/Output Summary (Last 24 hours) at 04/26/2023 0943 Last data filed at 04/26/2023 0600 Gross per 24 hour  Intake 240 ml  Output 2000 ml  Net -1760 ml      04/24/2023    5:07 PM 04/24/2023   10:12 AM 01/27/2018    9:37 AM  Last 3 Weights  Weight (lbs) 160 lb 9.6 oz 171 lb 178 lb  Weight (kg) 72.848 kg 77.565 kg 80.74 kg      Telemetry    very rare missed beats and PVCs, one episode of Wenkebach - Personally Reviewed  ECG    No new since 8/4 - Personally Reviewed  Physical Exam   GEN: No acute distress.   Neck: No JVD Cardiac: RRR, no murmurs, rubs, or gallops.  Respiratory: Clear to auscultation bilaterally. GI: Soft, nontender, non-distended  MS: No edema; No deformity. Neuro:  Nonfocal  Psych: Normal affect   Labs    High Sensitivity Troponin:   Recent Labs  Lab 04/24/23 1027  04/24/23 1155  TROPONINIHS 17 18*     Chemistry Recent Labs  Lab 04/24/23 1027 04/24/23 1732 04/25/23 0135  NA 139  --   --   K 3.9  --  3.5  CL 106  --   --   CO2 23  --   --   GLUCOSE 106*  --   --   BUN 7*  --   --   CREATININE 1.11 1.03  --   CALCIUM 8.8*  --   --   MG  --   --  2.1  PROT  --   --  6.5  ALBUMIN  --   --  3.1*  AST  --   --  21  ALT  --   --  29  ALKPHOS  --   --  72  BILITOT  --   --  1.5*  GFRNONAA >60 >60  --   ANIONGAP 10  --   --     Lipids No results for input(s): "CHOL", "TRIG", "HDL", "LABVLDL", "LDLCALC", "CHOLHDL" in the last 168 hours.  Hematology Recent Labs  Lab 04/24/23 1027 04/24/23 1732  WBC 6.1 7.8  RBC 4.64 4.97  HGB 11.4* 12.4*  HCT 37.8* 40.6  MCV 81.5 81.7  MCH 24.6* 24.9*  MCHC 30.2 30.5  RDW 17.1* 17.0*  PLT 374 400   Thyroid  Recent Labs  Lab 04/25/23 0135  TSH 3.002    BNP Recent Labs  Lab 04/24/23 1027  BNP 426.2*    DDimer  Recent Labs  Lab 04/24/23 1028  DDIMER 0.68*     Radiology    VAS [place] LOWER EXTREMITY VENOUS (DVT)  Result Date: 04/25/2023  Lower Venous DVT Study Patient Name:  Donald Leblanc  Date of Exam:   04/25/2023 Medical Rec #: [id number]        Accession #:    [id number] Date of Birth: June 15, 1956       Patient Gender: M Patient Age:   67 years Exam Location:  [facility] Procedure:      VAS [place] LOWER EXTREMITY VENOUS (DVT) Referring Phys: [facility] GOEL --------------------------------------------------------------------------------  Indications: Ordered for edema; however, multiple notes stating no edema. History of CHF/SOB.  Comparison Study: No prior studies. Performing Technologist: Jean Rosenthal RDMS, RVT  Examination Guidelines: A complete evaluation includes B-mode imaging, spectral Doppler, color Doppler, and power Doppler as needed of all accessible portions of each vessel. Bilateral testing is considered an integral part of a complete examination. Limited examinations for  reoccurring indications may be performed as noted. The reflux portion of the exam is performed with the patient in reverse Trendelenburg.  +---------+---------------+---------+-----------+----------+--------------+ RIGHT    CompressibilityPhasicitySpontaneityPropertiesThrombus Aging +---------+---------------+---------+-----------+----------+--------------+ CFV      Full           Yes      Yes                                 +---------+---------------+---------+-----------+----------+--------------+ SFJ      Full                                                        +---------+---------------+---------+-----------+----------+--------------+ FV Prox  Full                                                        +---------+---------------+---------+-----------+----------+--------------+ FV Mid   Full                                                        +---------+---------------+---------+-----------+----------+--------------+ FV DistalFull                                                        +---------+---------------+---------+-----------+----------+--------------+ PFV      Full                                                        +---------+---------------+---------+-----------+----------+--------------+  POP      Full           Yes      Yes                                 +---------+---------------+---------+-----------+----------+--------------+ PTV      Full                                                        +---------+---------------+---------+-----------+----------+--------------+ PERO     Full                                                        +---------+---------------+---------+-----------+----------+--------------+   +---------+---------------+---------+-----------+----------+--------------+ LEFT     CompressibilityPhasicitySpontaneityPropertiesThrombus Aging  +---------+---------------+---------+-----------+----------+--------------+ CFV      Full           Yes      Yes                                 +---------+---------------+---------+-----------+----------+--------------+ SFJ      Full                                                        +---------+---------------+---------+-----------+----------+--------------+ FV Prox  Full                                                        +---------+---------------+---------+-----------+----------+--------------+ FV Mid   Full                                                        +---------+---------------+---------+-----------+----------+--------------+ FV DistalFull                                                        +---------+---------------+---------+-----------+----------+--------------+ PFV      Full                                                        +---------+---------------+---------+-----------+----------+--------------+ POP      Full           Yes      Yes                                 +---------+---------------+---------+-----------+----------+--------------+  PTV      Full                                                        +---------+---------------+---------+-----------+----------+--------------+ PERO     Full                                                        +---------+---------------+---------+-----------+----------+--------------+     Summary: RIGHT: - There is no evidence of deep vein thrombosis in the lower extremity.  - No cystic structure found in the popliteal fossa.  LEFT: - There is no evidence of deep vein thrombosis in the lower extremity.  - No cystic structure found in the popliteal fossa.  *See table(s) above for measurements and observations. Electronically signed by Heath Lark on 04/25/2023 at 5:01:29 PM.    Final    ECHOCARDIOGRAM COMPLETE  Result Date: 04/24/2023    ECHOCARDIOGRAM REPORT   Patient Name:    Donald Leblanc Date of Exam: 04/24/2023 Medical Rec #:  [id number]       Height:       70.5 in Accession #:    [id number]      Weight:       171.0 lb Date of Birth:  19-Mar-1956      BSA:          1.963 m Patient Age:    66 years        BP:           158/97 mmHg Patient Gender: M               HR:           80 bpm. Exam Location:  Inpatient Procedure: 2D Echo, Cardiac Doppler and Color Doppler Indications:    CHF-Acute Systolic I50.21  History:        Patient has no prior history of Echocardiogram examinations.                 CAD; COPD. Hx of substance abuse (HCC) (From Hx). CABG April                 2024 per patient.  Sonographer:    Celesta Gentile RCS Referring Phys: [facility] GOEL IMPRESSIONS  1. Left ventricular ejection fraction, by estimation, is 25 to 30%. The left ventricle has severely decreased function. The left ventricle demonstrates global hypokinesis. The left ventricular internal cavity size was mildly to moderately dilated. Left ventricular diastolic parameters are consistent with Grade III diastolic dysfunction (restrictive).  2. Right ventricular systolic function is severely reduced. The right ventricular size is normal.  3. Left atrial size was moderately dilated.  4. Right atrial size was moderately dilated.  5. The mitral valve is normal in structure. Moderate to severe mitral valve regurgitation. No evidence of mitral stenosis.  6. The aortic valve is normal in structure. Aortic valve regurgitation is not visualized. No aortic stenosis is present.  7. The inferior vena cava is normal in size with greater than 50% respiratory variability, suggesting right atrial pressure of 3 mmHg. FINDINGS  Left Ventricle: Left ventricular ejection fraction, by estimation, is 25 to 30%. The left  ventricle has severely decreased function. The left ventricle demonstrates global hypokinesis. Definity contrast agent was given IV to delineate the left ventricular endocardial borders. The left ventricular internal cavity  size was mildly to moderately dilated. There is no left ventricular hypertrophy. Left ventricular diastolic parameters are consistent with Grade III diastolic dysfunction (restrictive). Right Ventricle: The right ventricular size is normal. No increase in right ventricular wall thickness. Right ventricular systolic function is severely reduced. Left Atrium: Left atrial size was moderately dilated. Right Atrium: Right atrial size was moderately dilated. Pericardium: There is no evidence of pericardial effusion. Mitral Valve: The mitral valve is normal in structure. Moderate to severe mitral valve regurgitation, with centrally-directed jet. No evidence of mitral valve stenosis. Tricuspid Valve: The tricuspid valve is normal in structure. Tricuspid valve regurgitation is mild . No evidence of tricuspid stenosis. Aortic Valve: The aortic valve is normal in structure. Aortic valve regurgitation is not visualized. No aortic stenosis is present. Pulmonic Valve: The pulmonic valve was not well visualized. Pulmonic valve regurgitation is trivial. No evidence of pulmonic stenosis. Aorta: The aortic root is normal in size and structure. Venous: The inferior vena cava is normal in size with greater than 50% respiratory variability, suggesting right atrial pressure of 3 mmHg. IAS/Shunts: No atrial level shunt detected by color flow Doppler.  LEFT VENTRICLE PLAX 2D LVIDd:         5.00 cm      Diastology LVIDs:         4.40 cm      LV e' medial:    4.57 cm/s LV PW:         0.90 cm      LV E/e' medial:  27.1 LV IVS:        1.00 cm      LV e' lateral:   4.57 cm/s LVOT diam:     1.60 cm      LV E/e' lateral: 27.1 LV SV:         27 LV SV Index:   14 LVOT Area:     2.01 cm  LV Volumes (MOD) LV vol d, MOD A2C: 119.0 ml LV vol d, MOD A4C: 104.0 ml LV vol s, MOD A2C: 75.0 ml LV vol s, MOD A4C: 75.3 ml LV SV MOD A2C:     44.0 ml LV SV MOD A4C:     104.0 ml LV SV MOD BP:      38.6 ml RIGHT VENTRICLE RV S prime:     6.20 cm/s TAPSE  (M-mode): 0.9 cm LEFT ATRIUM             Index        RIGHT ATRIUM           Index LA diam:        3.90 cm 1.99 cm/m   RA Area:     14.60 cm LA Vol (A2C):   47.1 ml 23.99 ml/m  RA Volume:   35.80 ml  18.24 ml/m LA Vol (A4C):   58.5 ml 29.80 ml/m LA Biplane Vol: 57.3 ml 29.19 ml/m  AORTIC VALVE LVOT Vmax:   68.70 cm/s LVOT Vmean:  45.400 cm/s LVOT VTI:    0.132 m  AORTA Ao Root diam: 3.00 cm MITRAL VALVE                TRICUSPID VALVE MV Area (PHT): 7.44 cm     TR Peak grad:   36.0 mmHg MV Decel Time: 102 msec  TR Vmax:        300.00 cm/s MR Peak grad: 91.0 mmHg MR Mean grad: 59.0 mmHg     SHUNTS MR Vmax:      477.00 cm/s   Systemic VTI:  0.13 m MR Vmean:     366.0 cm/s    Systemic Diam: 1.60 cm MV E velocity: 124.00 cm/s MV A velocity: 56.60 cm/s MV E/A ratio:  2.19 Arvilla Meres MD Electronically signed by Arvilla Meres MD Signature Date/Time: 04/24/2023/7:22:28 PM    Final    CT CHEST WO CONTRAST  Result Date: 04/24/2023 CLINICAL DATA:  Diffuse interstitial lung disease EXAM: CT CHEST WITHOUT CONTRAST TECHNIQUE: Multidetector CT imaging of the chest was performed following the standard protocol without IV contrast. RADIATION DOSE REDUCTION: This exam was performed according to the departmental dose-optimization program which includes automated exposure control, adjustment of the mA and/or kV according to patient size and/or use of iterative reconstruction technique. COMPARISON:  06/03/2009.  Chest x-ray today FINDINGS: Cardiovascular: Heart is mildly enlarged. Prior CABG. Aortic atherosclerosis. No aneurysm. Mediastinum/Nodes: Borderline mediastinal lymph nodes. Prevascular lymph node has a short axis diameter of 9 mm. Right paratracheal lymph node has a short axis diameter of 9 mm. No visible hilar or axillary adenopathy. Trachea and esophagus are unremarkable. Thyroid unremarkable. Lungs/Pleura: Bronchiectasis and cystic changes/scarring in the right upper lobe and superior segment of the right  lower lobe, unchanged. Right calcified pleural plaques in the lower right chest. Small left pleural effusion. No confluent airspace opacities on the left. Upper Abdomen: No acute findings Musculoskeletal: Chest wall soft tissues are unremarkable. No acute bony abnormality. IMPRESSION: Stable bronchiectasis and scarring in the right upper lobe and right lower lobe. Pleural thickening and calcified pleural plaques on the right, stable. Small left pleural effusion. Cardiomegaly. Aortic Atherosclerosis (ICD10-I70.0). Electronically Signed   By: Charlett Nose M.D.   On: 04/24/2023 17:06   DG Chest 1 View  Result Date: 04/24/2023 CLINICAL DATA:  SOB EXAM: CHEST  1 VIEW COMPARISON:  CXR 01/14/18 FINDINGS: Status post median sternotomy and CABG. Cardiomegaly.No pleural effusion. No pneumothorax. Unchanged linear opacity in the right upper lobe. There is a new hazy opacity at the right lung base, which could represent atelectasis or infection. No radiographically apparent displaced rib fractures. Visualized upper abdomen is unremarkable. IMPRESSION: 1. New hazy opacity at the right lung base, which could represent atelectasis or infection. 2. Cardiomegaly. If there is clinical concern for a pericardial effusion, further evaluation with echocardiography is recommended. Electronically Signed   By: Lorenza Cambridge M.D.   On: 04/24/2023 11:14    Cardiac Studies   Echo 04/24/23 reviewed  Patient Profile     67 y.o. male with PMH CAD s/p 4V CABG (12/21/22, at [facility]), PAD, COPD presenting after lightheaded spell.   Assessment & Plan    Near syncope Second degree AV block -holding beta blocker -was outside working in the heat, had not had anything to eat in >12 hours, felt lightheaded and short of breath, had to sit down. No full loss of consciousness -high suspicion for dehydration/vagal episode, but continue to monitor on telemetry -ECG from 04/24/23 at 23:08 shows brief 2:1 AV block and then PVC. Patient sleeping at the  time -ECG from 04/25/23 1:10 AM shows both Wenkebach and 2:1 block -telemetry with very rare missed beats and PVCs, one episode of Wenkebach. No high degree conduction disease seen in last 24 hours.  Reduced EF -EF 25-30%, was 45-50% after CABG in 12/2022 -RV function  also severely reduced -moderate-severe MR now (was mild 12/2022) -no beta blocker given bradycardia/near syncope as above -global hypokinesis, but with drop in EF, discussed LHC. hsTn 17, 18. D-dimer 0.68, has not had CTPE but DVT study negative -on imdur, continue -started on entresto, spironolactone, monitor BP, K, Cr. Labs not available yet this AM. -clinically euvolemic but also net negative >2L without specific diuresis  CAD s/p CABGx4 12/21/22 ([facility]) Hypercholesterolemia -continue aspirin, clopidogrel -continue atorvastatin 40 mg daily. LDL 12/21/22 was 139 -reports DOE both prior to and after his CABG. hsTn flat but as above, discussed LHC to evaluate grafts. He is NPO and is amenable.  Informed Consent   Shared Decision Making/Informed Consent The risks [stroke (1 in 1000), death (1 in 1000), kidney failure [usually temporary] (1 in 500), bleeding (1 in 200), allergic reaction [possibly serious] (1 in 200)], benefits (diagnostic support and management of coronary artery disease) and alternatives of a cardiac catheterization were discussed in detail with Mr. Lamberth and he is willing to proceed.      For questions or updates, please contact [facility] Please consult [email or web address] for contact info under        Signed, Jodelle Red, MD  04/26/2023, 9:43 AM

## 2023-04-26 NOTE — TOC CM/SW Note (Signed)
Transition of Care [facility]) - Inpatient Brief Assessment   Patient Details  Name: Donald Leblanc MRN: [id number] Date of Birth: 1956-06-10  Transition of [facility]) CM/SW Contact:    Gala Lewandowsky, RN Phone Number: 04/26/2023, 3:03 PM   Clinical Narrative: Patient presented for near syncope. Case Manager called the [facility] Fees Coordinator to make them aware of hospitalization. Patient is a member of the [facility]; PCP is Joseph Art and CSW is Kennon Portela # [phone] ext [phone]. Case Manager will continue to follow for transition of care needs as the patient progresses.    Transition of Care Asessment: Insurance and Status: Insurance coverage has been reviewed Patient has primary care physician: Yes  Prior/Current Home Services: No current home services Social Determinants of Health Reivew: SDOH reviewed no interventions necessary Readmission risk has been reviewed: Yes Transition of care needs: transition of care needs identified, TOC will continue to follow

## 2023-04-27 ENCOUNTER — Encounter (HOSPITAL_COMMUNITY): Payer: Self-pay | Admitting: Cardiovascular Disease

## 2023-04-27 DIAGNOSIS — I441 Atrioventricular block, second degree: Secondary | ICD-10-CM | POA: Diagnosis not present

## 2023-04-27 DIAGNOSIS — I429 Cardiomyopathy, unspecified: Secondary | ICD-10-CM | POA: Diagnosis not present

## 2023-04-27 DIAGNOSIS — R55 Syncope and collapse: Secondary | ICD-10-CM | POA: Diagnosis not present

## 2023-04-27 DIAGNOSIS — I5082 Biventricular heart failure: Secondary | ICD-10-CM | POA: Diagnosis not present

## 2023-04-27 LAB — BASIC METABOLIC PANEL
Anion gap: 10 (ref 5–15)
BUN: 9 mg/dL (ref 8–23)
CO2: 25 mmol/L (ref 22–32)
Calcium: 9.3 mg/dL (ref 8.9–10.3)
Chloride: 101 mmol/L (ref 98–111)
Creatinine, Ser: 1.14 mg/dL (ref 0.61–1.24)
GFR, Estimated: >60 mL/min (ref >60)
Glucose, Bld: 106 mg/dL — ABNORMAL HIGH (ref 70–99)
Potassium: 3.6 mmol/L (ref 3.5–5.1)
Sodium: 136 mmol/L (ref 135–145)

## 2023-04-27 LAB — GLUCOSE, CAPILLARY
Glucose-Capillary: 137 mg/dL — ABNORMAL HIGH (ref 70–99)
Glucose-Capillary: 126 mg/dL — ABNORMAL HIGH (ref 70–99)
Glucose-Capillary: 123 mg/dL — ABNORMAL HIGH (ref 70–99)
Glucose-Capillary: 124 mg/dL — ABNORMAL HIGH (ref 70–99)

## 2023-04-27 MED ORDER — FUROSEMIDE 10 MG/ML IJ SOLN
40.0000 mg | Freq: Once | INTRAMUSCULAR | Status: AC
  Administered 2023-04-27: 40 mg via INTRAVENOUS
  Filled 2023-04-27: qty 4

## 2023-04-27 NOTE — Progress Notes (Signed)
PROGRESS NOTE    Donald Leblanc  JXB:[id number] DOB: Feb 12, 1956 DOA: 04/24/2023 PCP: Sondra Come, MD   Brief Narrative:  Donald Leblanc is a 67 y.o. male with a hx of CAD s/p CABG x4 April 2024 (LIMA-LAD, SVG- PDA, SVG- OM, SVG-D1), PAD, COPD, HTN, HLD who presented to ED with shortness of breath as well as near syncope episodes.  After admission, patient was diagnosed with Mobitz 2 heart block as well as bradycardia.  Cardiology was consulted.   Assessment & Plan:   Principal Problem:   Near syncope Active Problems:   Abnormal CXR   CAD (coronary artery disease)   Acute systolic (congestive) heart failure (HCC)  Near syncope: Could be cardiac in origin.  Has Mobitz type II heart block.  Beta-blocker discontinued.  Cardiology on board and managing.  History of CAD with CABG x 4 at [facility] April 2024/new onset systolic congestive heart failure: Comes in with some shortness of breath.  Echo now shows 25 to 30% ejection fraction.  Seen by cardiology, has been started on Imdur, Entresto and Aldactone.  Underwent cardiac cath, was found to have clean grafts but had elevated LVEDP, on Plavix and cardiology plans to diurese with IV Lasix today and reassess tomorrow.  Hyperlipidemia: Continue atorvastatin.  History of COPD: Stable.  DVT prophylaxis: enoxaparin (LOVENOX) injection 40 mg Start: 04/25/23 0800 SCDs Start: 04/24/23 1318   Code Status: Full Code  Family Communication:  None present at bedside.  Plan of care discussed with patient in length and he/she verbalized understanding and agreed with it.  Status is: Inpatient Remains inpatient appropriate because: Fluid overload, needs IV diuresis per cardiology.  Estimated body mass index is 22.72 kg/m as calculated from the following:   Height as of this encounter: 5' 10.5" (1.791 m).   Weight as of this encounter: 72.8 kg.    Nutritional Assessment: Body mass index is 22.72 kg/m.Marland Kitchen Seen by dietician.  I agree with the  assessment and plan as outlined below: Nutrition Status:        . Skin Assessment: I have examined the patient's skin and I agree with the wound assessment as performed by the wound care RN as outlined below:    Consultants:  Cardiology  Procedures:  As above  Antimicrobials:  Anti-infectives (From admission, onward)    None         Subjective: Patient seen and examined.  He has no complaints.  Objective: Vitals:   04/26/23 2200 04/26/23 2259 04/27/23 0426 04/27/23 0834  BP: 116/78 122/89 125/77 (!) 141/88  Pulse:  99 91   Resp:   17 18  Temp:   98.1 F (36.7 C) 97.7 F (36.5 C)  TempSrc:   Oral Oral  SpO2:  96% 95%   Weight:      Height:        Intake/Output Summary (Last 24 hours) at 04/27/2023 1054 Last data filed at 04/26/2023 2300 Gross per 24 hour  Intake 523.32 ml  Output 290 ml  Net 233.32 ml   Filed Weights   04/24/23 1012 04/24/23 1707  Weight: 77.6 kg 72.8 kg    Examination:  General exam: Appears calm and comfortable  Respiratory system: Clear to auscultation. Respiratory effort normal. Cardiovascular system: S1 & S2 heard, RRR. No JVD, murmurs, rubs, gallops or clicks. No pedal edema. Gastrointestinal system: Abdomen is nondistended, soft and nontender. No organomegaly or masses felt. Normal bowel sounds heard. Central nervous system: Alert and oriented. No focal neurological deficits. Extremities: Symmetric 5  x 5 power. Skin: No rashes, lesions or ulcers.  Psychiatry: Judgement and insight appear normal. Mood & affect appropriate.    Data Reviewed: I have personally reviewed following labs and imaging studies  CBC: Recent Labs  Lab 04/24/23 1027 04/24/23 1732 04/26/23 1509 04/26/23 1513  WBC 6.1 7.8  --   --   HGB 11.4* 12.4* 16.7 16.7  HCT 37.8* 40.6 49.0 49.0  MCV 81.5 81.7  --   --   PLT 374 400  --   --    Basic Metabolic Panel: Recent Labs  Lab 04/24/23 1027 04/24/23 1732 04/25/23 0135 04/26/23 1249 04/26/23 1509  04/26/23 1513 04/27/23 0856  NA 139  --   --  139 143 141 136  K 3.9  --  3.5 3.4* 3.5 3.7 3.6  CL 106  --   --  106  --   --  101  CO2 23  --   --  24  --   --  25  GLUCOSE 106*  --   --  107*  --   --  106*  BUN 7*  --   --  5*  --   --  9  CREATININE 1.11 1.03  --  0.80  --   --  1.14  CALCIUM 8.8*  --   --  9.2  --   --  9.3  MG  --   --  2.1  --   --   --   --    GFR: Estimated Creatinine Clearance: 65.6 mL/min (by C-G formula based on SCr of 1.14 mg/dL). Liver Function Tests: Recent Labs  Lab 04/25/23 0135  AST 21  ALT 29  ALKPHOS 72  BILITOT 1.5*  PROT 6.5  ALBUMIN 3.1*   No results for input(s): "LIPASE", "AMYLASE" in the last 168 hours. No results for input(s): "AMMONIA" in the last 168 hours. Coagulation Profile: Recent Labs  Lab 04/25/23 0135  INR 1.2   Cardiac Enzymes: No results for input(s): "CKTOTAL", "CKMB", "CKMBINDEX", "TROPONINI" in the last 168 hours. BNP (last 3 results) No results for input(s): "PROBNP" in the last 8760 hours. HbA1C: Recent Labs    04/24/23 1732  HGBA1C 6.3*   CBG: Recent Labs  Lab 04/26/23 0759 04/26/23 1204 04/26/23 1631 04/26/23 2056 04/27/23 0837  GLUCAP 116* 129* 94 139* 137*   Lipid Profile: No results for input(s): "CHOL", "HDL", "LDLCALC", "TRIG", "CHOLHDL", "LDLDIRECT" in the last 72 hours. Thyroid Function Tests: Recent Labs    04/25/23 0135  TSH 3.002   Anemia Panel: No results for input(s): "VITAMINB12", "FOLATE", "FERRITIN", "TIBC", "IRON", "RETICCTPCT" in the last 72 hours. Sepsis Labs: No results for input(s): "PROCALCITON", "LATICACIDVEN" in the last 168 hours.  No results found for this or any previous visit (from the past 240 hour(s)).   Radiology Studies: CARDIAC CATHETERIZATION  Result Date: 04/26/2023   Prox RCA lesion is 90% stenosed.   Dist RCA lesion is 100% stenosed.   Prox RCA to Mid RCA lesion is 50% stenosed.   Ost Cx to Prox Cx lesion is 90% stenosed.   1st Mrg lesion is 10%  stenosed.   Mid LAD lesion is 100% stenosed.   SVG graft was visualized by angiography and is normal in caliber.   LIMA graft was visualized by angiography and is normal in caliber. Severe three vessel CAD s/p 3V CABG with 3/3 patent bypass grafts The LAD is occluded in the mid vessel. The free LIMA to the mid LAD  is patent (The LIMA was resected and used as a free graft).  I did not see a vein graft to the Diagonal branch. The Circumflex has severe ostial stenosis followed by a patent stented segment. The vein graft to the obtuse marginal branch is patent The RCA is occluded in the mid vessel. The vein graft to the PDA is patent. LVEDP 22 mmHg, PCWP 22 mmHg Recommendations: Medical management of CAD. Mild elevation in filling pressures. He would benefit from further diuresis.   VAS [place] LOWER EXTREMITY VENOUS (DVT)  Result Date: 04/25/2023  Lower Venous DVT Study Patient Name:  Donald Leblanc  Date of Exam:   04/25/2023 Medical Rec #: [id number]        Accession #:    [id number] Date of Birth: 19-Apr-1956       Patient Gender: M Patient Age:   32 years Exam Location:  [facility] Procedure:      VAS [place] LOWER EXTREMITY VENOUS (DVT) Referring Phys: [facility] GOEL --------------------------------------------------------------------------------  Indications: Ordered for edema; however, multiple notes stating no edema. History of CHF/SOB.  Comparison Study: No prior studies. Performing Technologist: Jean Rosenthal RDMS, RVT  Examination Guidelines: A complete evaluation includes B-mode imaging, spectral Doppler, color Doppler, and power Doppler as needed of all accessible portions of each vessel. Bilateral testing is considered an integral part of a complete examination. Limited examinations for reoccurring indications may be performed as noted. The reflux portion of the exam is performed with the patient in reverse Trendelenburg.  +---------+---------------+---------+-----------+----------+--------------+ RIGHT     CompressibilityPhasicitySpontaneityPropertiesThrombus Aging +---------+---------------+---------+-----------+----------+--------------+ CFV      Full           Yes      Yes                                 +---------+---------------+---------+-----------+----------+--------------+ SFJ      Full                                                        +---------+---------------+---------+-----------+----------+--------------+ FV Prox  Full                                                        +---------+---------------+---------+-----------+----------+--------------+ FV Mid   Full                                                        +---------+---------------+---------+-----------+----------+--------------+ FV DistalFull                                                        +---------+---------------+---------+-----------+----------+--------------+ PFV      Full                                                        +---------+---------------+---------+-----------+----------+--------------+  POP      Full           Yes      Yes                                 +---------+---------------+---------+-----------+----------+--------------+ PTV      Full                                                        +---------+---------------+---------+-----------+----------+--------------+ PERO     Full                                                        +---------+---------------+---------+-----------+----------+--------------+   +---------+---------------+---------+-----------+----------+--------------+ LEFT     CompressibilityPhasicitySpontaneityPropertiesThrombus Aging +---------+---------------+---------+-----------+----------+--------------+ CFV      Full           Yes      Yes                                 +---------+---------------+---------+-----------+----------+--------------+ SFJ      Full                                                         +---------+---------------+---------+-----------+----------+--------------+ FV Prox  Full                                                        +---------+---------------+---------+-----------+----------+--------------+ FV Mid   Full                                                        +---------+---------------+---------+-----------+----------+--------------+ FV DistalFull                                                        +---------+---------------+---------+-----------+----------+--------------+ PFV      Full                                                        +---------+---------------+---------+-----------+----------+--------------+ POP      Full           Yes      Yes                                 +---------+---------------+---------+-----------+----------+--------------+  PTV      Full                                                        +---------+---------------+---------+-----------+----------+--------------+ PERO     Full                                                        +---------+---------------+---------+-----------+----------+--------------+     Summary: RIGHT: - There is no evidence of deep vein thrombosis in the lower extremity.  - No cystic structure found in the popliteal fossa.  LEFT: - There is no evidence of deep vein thrombosis in the lower extremity.  - No cystic structure found in the popliteal fossa.  *See table(s) above for measurements and observations. Electronically signed by Heath Lark on 04/25/2023 at 5:01:29 PM.    Final     Scheduled Meds:  aspirin  81 mg Oral Daily   atorvastatin  40 mg Oral Daily   clopidogrel  75 mg Oral Daily   enoxaparin (LOVENOX) injection  40 mg Subcutaneous Q24H   insulin aspart  0-15 Units Subcutaneous TID WC   insulin aspart  0-5 Units Subcutaneous QHS   isosorbide mononitrate  30 mg Oral Daily   sacubitril-valsartan  1 tablet Oral BID   sodium chloride flush   3 mL Intravenous Q12H   sodium chloride flush  3 mL Intravenous Q12H   spironolactone  25 mg Oral Daily   Continuous Infusions:  sodium chloride       LOS: 2 days   Hughie Closs, MD [facility]  04/27/2023, 10:54 AM   *Please note that this is a verbal dictation therefore any spelling or grammatical errors are due to the "[employer]agon [employer] One" system interpretation.  Please page via Amion and do not message via secure chat for urgent patient care matters. Secure chat can be used for non urgent patient care matters.  How to contact the [facility] Attending or Consulting provider 7A - 7P or covering provider during after hours 7P -7A, for this patient?  Check the care team in [facility] and look for a) attending/consulting TRH provider listed and b) the [facility] team listed. Page or secure chat 7A-7P. Log into [email or web address] and use [place]'s universal password to access. If you do not have the password, please contact the hospital operator. Locate the [facility] provider you are looking for under [facility] and page to a number that you can be directly reached. If you still have difficulty reaching the provider, please page the [facility] (Director on Call) for the Hospitalists listed on amion for assistance.

## 2023-04-27 NOTE — Progress Notes (Signed)
Heart Failure Navigator Progress Note  Assessed for Heart & Vascular TOC clinic readiness.  Patient with a EF of 25-30% HFrEF. Per patient and MD, he see's a cardiologist at the [facility] and would like to stay with them. Has a appointment scheduled for 09/07/2023 @ 10:30, however will get it moved up after discharge. .   Navigator available for reassessment of patient.   Rhae Hammock, BSN, Scientist, clinical (histocompatibility and immunogenetics) Only

## 2023-04-27 NOTE — Progress Notes (Signed)
Rounding Note    Patient Name: Donald Leblanc Date of Encounter: 04/27/2023  [facility] Cardiologist: [facility]  Subjective   Reviewed cath. Feeling well today, no chest pain or shortness of breath.  Inpatient Medications    Scheduled Meds:  aspirin  81 mg Oral Daily   atorvastatin  40 mg Oral Daily   clopidogrel  75 mg Oral Daily   enoxaparin (LOVENOX) injection  40 mg Subcutaneous Q24H   insulin aspart  0-15 Units Subcutaneous TID WC   insulin aspart  0-5 Units Subcutaneous QHS   isosorbide mononitrate  30 mg Oral Daily   sacubitril-valsartan  1 tablet Oral BID   sodium chloride flush  3 mL Intravenous Q12H   sodium chloride flush  3 mL Intravenous Q12H   spironolactone  25 mg Oral Daily   Continuous Infusions:  sodium chloride     PRN Meds: sodium chloride, acetaminophen **OR** acetaminophen, albuterol, HYDROcodone-acetaminophen, polyethylene glycol, sodium chloride flush   Vital Signs    Vitals:   04/26/23 2200 04/26/23 2259 04/27/23 0426 04/27/23 0834  BP: 116/78 122/89 125/77 (!) 141/88  Pulse:  99 91   Resp:   17 18  Temp:   98.1 F (36.7 C) 97.7 F (36.5 C)  TempSrc:   Oral Oral  SpO2:  96% 95%   Weight:      Height:        Intake/Output Summary (Last 24 hours) at 04/27/2023 0942 Last data filed at 04/26/2023 2300 Gross per 24 hour  Intake 523.32 ml  Output 690 ml  Net -166.68 ml      04/24/2023    5:07 PM 04/24/2023   10:12 AM 01/27/2018    9:37 AM  Last 3 Weights  Weight (lbs) 160 lb 9.6 oz 171 lb 178 lb  Weight (kg) 72.848 kg 77.565 kg 80.74 kg      Telemetry    very rare missed beats and PVCs, otherwise sinus - Personally Reviewed  ECG    No new since 8/4 - Personally Reviewed  Physical Exam   GEN: Well nourished, well developed in no acute distress NECK: No JVD CARDIAC: regular rhythm, normal S1 and S2, no rubs or gallops. No murmur. VASCULAR: Radial pulses 2+ bilaterally.  RESPIRATORY:  Clear to auscultation without  rales, wheezing or rhonchi  ABDOMEN: Soft, non-tender, non-distended MUSCULOSKELETAL:  Moves all 4 limbs independently SKIN: Warm and dry, no edema NEUROLOGIC:  No focal neuro deficits noted. PSYCHIATRIC:  Normal affect    Labs    High Sensitivity Troponin:   Recent Labs  Lab 04/24/23 1027 04/24/23 1155  TROPONINIHS 17 18*     Chemistry Recent Labs  Lab 04/24/23 1027 04/24/23 1732 04/25/23 0135 04/26/23 1249 04/26/23 1509 04/26/23 1513  NA 139  --   --  139 143 141  K 3.9  --  3.5 3.4* 3.5 3.7  CL 106  --   --  106  --   --   CO2 23  --   --  24  --   --   GLUCOSE 106*  --   --  107*  --   --   BUN 7*  --   --  5*  --   --   CREATININE 1.11 1.03  --  0.80  --   --   CALCIUM 8.8*  --   --  9.2  --   --   MG  --   --  2.1  --   --   --  PROT  --   --  6.5  --   --   --   ALBUMIN  --   --  3.1*  --   --   --   AST  --   --  21  --   --   --   ALT  --   --  29  --   --   --   ALKPHOS  --   --  72  --   --   --   BILITOT  --   --  1.5*  --   --   --   GFRNONAA >60 >60  --  >60  --   --   ANIONGAP 10  --   --  9  --   --     Lipids No results for input(s): "CHOL", "TRIG", "HDL", "LABVLDL", "LDLCALC", "CHOLHDL" in the last 168 hours.  Hematology Recent Labs  Lab 04/24/23 1027 04/24/23 1732 04/26/23 1509 04/26/23 1513  WBC 6.1 7.8  --   --   RBC 4.64 4.97  --   --   HGB 11.4* 12.4* 16.7 16.7  HCT 37.8* 40.6 49.0 49.0  MCV 81.5 81.7  --   --   MCH 24.6* 24.9*  --   --   MCHC 30.2 30.5  --   --   RDW 17.1* 17.0*  --   --   PLT 374 400  --   --    Thyroid  Recent Labs  Lab 04/25/23 0135  TSH 3.002    BNP Recent Labs  Lab 04/24/23 1027  BNP 426.2*    DDimer  Recent Labs  Lab 04/24/23 1028  DDIMER 0.68*     Radiology    CARDIAC CATHETERIZATION  Result Date: 04/26/2023   Prox RCA lesion is 90% stenosed.   Dist RCA lesion is 100% stenosed.   Prox RCA to Mid RCA lesion is 50% stenosed.   Ost Cx to Prox Cx lesion is 90% stenosed.   1st Mrg lesion is  10% stenosed.   Mid LAD lesion is 100% stenosed.   SVG graft was visualized by angiography and is normal in caliber.   LIMA graft was visualized by angiography and is normal in caliber. Severe three vessel CAD s/p 3V CABG with 3/3 patent bypass grafts The LAD is occluded in the mid vessel. The free LIMA to the mid LAD is patent (The LIMA was resected and used as a free graft).  I did not see a vein graft to the Diagonal branch. The Circumflex has severe ostial stenosis followed by a patent stented segment. The vein graft to the obtuse marginal branch is patent The RCA is occluded in the mid vessel. The vein graft to the PDA is patent. LVEDP 22 mmHg, PCWP 22 mmHg Recommendations: Medical management of CAD. Mild elevation in filling pressures. He would benefit from further diuresis.   VAS [place] LOWER EXTREMITY VENOUS (DVT)  Result Date: 04/25/2023  Lower Venous DVT Study Patient Name:  Donald Leblanc  Date of Exam:   04/25/2023 Medical Rec #: [id number]        Accession #:    [id number] Date of Birth: [birth year]       Patient Gender: M Patient Age:   67 years Exam Location:  [facility] Procedure:      VAS [place] LOWER EXTREMITY VENOUS (DVT) Referring Phys: [facility] GOEL --------------------------------------------------------------------------------  Indications: Ordered for edema; however, multiple notes stating no edema. History of CHF/SOB.  Comparison Study: No prior studies. Performing Technologist: Jean Rosenthal RDMS, RVT  Examination Guidelines: A complete evaluation includes B-mode imaging, spectral Doppler, color Doppler, and power Doppler as needed of all accessible portions of each vessel. Bilateral testing is considered an integral part of a complete examination. Limited examinations for reoccurring indications may be performed as noted. The reflux portion of the exam is performed with the patient in reverse Trendelenburg.  +---------+---------------+---------+-----------+----------+--------------+ RIGHT     CompressibilityPhasicitySpontaneityPropertiesThrombus Aging +---------+---------------+---------+-----------+----------+--------------+ CFV      Full           Yes      Yes                                 +---------+---------------+---------+-----------+----------+--------------+ SFJ      Full                                                        +---------+---------------+---------+-----------+----------+--------------+ FV Prox  Full                                                        +---------+---------------+---------+-----------+----------+--------------+ FV Mid   Full                                                        +---------+---------------+---------+-----------+----------+--------------+ FV DistalFull                                                        +---------+---------------+---------+-----------+----------+--------------+ PFV      Full                                                        +---------+---------------+---------+-----------+----------+--------------+ POP      Full           Yes      Yes                                 +---------+---------------+---------+-----------+----------+--------------+ PTV      Full                                                        +---------+---------------+---------+-----------+----------+--------------+ PERO     Full                                                        +---------+---------------+---------+-----------+----------+--------------+   +---------+---------------+---------+-----------+----------+--------------+  LEFT     CompressibilityPhasicitySpontaneityPropertiesThrombus Aging +---------+---------------+---------+-----------+----------+--------------+ CFV      Full           Yes      Yes                                 +---------+---------------+---------+-----------+----------+--------------+ SFJ      Full                                                         +---------+---------------+---------+-----------+----------+--------------+ FV Prox  Full                                                        +---------+---------------+---------+-----------+----------+--------------+ FV Mid   Full                                                        +---------+---------------+---------+-----------+----------+--------------+ FV DistalFull                                                        +---------+---------------+---------+-----------+----------+--------------+ PFV      Full                                                        +---------+---------------+---------+-----------+----------+--------------+ POP      Full           Yes      Yes                                 +---------+---------------+---------+-----------+----------+--------------+ PTV      Full                                                        +---------+---------------+---------+-----------+----------+--------------+ PERO     Full                                                        +---------+---------------+---------+-----------+----------+--------------+     Summary: RIGHT: - There is no evidence of deep vein thrombosis in the lower extremity.  - No cystic structure found in the popliteal fossa.  LEFT: - There is no evidence of deep vein thrombosis in the lower extremity.  - No  cystic structure found in the popliteal fossa.  *See table(s) above for measurements and observations. Electronically signed by Heath Lark on 04/25/2023 at 5:01:29 PM.    Final     Cardiac Studies   Echo 04/24/23 reviewed Cath 8.5.24 reviewed  Per op note ([facility]) * 4-vessel CABG On Pump - greater saphenous vein graft anastomosed end-to-side to the posterior descending artery from ascending aorta - greater saphenous vein graft anastomosed end-to-side to the first obtuse marginal from ascending aorta - greater saphenous vein graft anastomosed  end-to-side to the first diagonal from t-graft off saphenous vein - free left internal mammary artery anastomosed end-to-side to the mid left anterior descending from t-graft off saphenous vein   Patient Profile     67 y.o. male with PMH CAD s/p 4V CABG (12/21/22, at [facility]), PAD, COPD presenting after lightheaded spell.   Assessment & Plan    Near syncope Second degree AV block -holding beta blocker -was outside working in the heat, had not had anything to eat in >12 hours, felt lightheaded and short of breath, had to sit down. No full loss of consciousness -high suspicion for dehydration/vagal episode, but continue to monitor on telemetry -ECG from 04/24/23 at 23:08 shows brief 2:1 AV block and then PVC. Patient sleeping at the time -ECG from 04/25/23 1:10 AM shows both Wenkebach and 2:1 block -telemetry with very rare missed beats and PVCs. No high degree conduction disease seen in last 24 hours.  Reduced EF -EF 25-30%, was 45-50% after CABG in 12/2022 -RV function also severely reduced -moderate-severe MR now (was mild 12/2022) -no beta blocker given bradycardia/near syncope as above -cath as below -on imdur, continue -started on entresto, spironolactone, monitor BP, K, Cr. Labs not available yet this AM. -LVEDP 22 on cath -no daily weights, suspect I/O inaccurate -will give 40 IV lasix today. Awaiting BMET. -follows with a cardiologist at the [facility]  CAD s/p CABGx4 12/21/22 ([facility]) Hypercholesterolemia -continue aspirin, clopidogrel -continue atorvastatin 40 mg daily. LDL 12/21/22 was 139 -cath shows severe native 3V disease with three patent grafts (free LIMA-LAD, SVG-OM, SVG-PDA). The op note mentions SVG-D1 from t-graft but this was not visualized on cath. Even if this graft is occluded, would not expect this severe of a drop in EF.  For questions or updates, please contact  [facility] Please consult [email or web address] for contact info under        Signed, Jodelle Red, MD   04/27/2023, 9:42 AM

## 2023-04-27 NOTE — Progress Notes (Addendum)
   Heart Failure Stewardship Pharmacist Progress Note   PCP: Sondra Come, MD PCP-Cardiologist: None    HPI:  67 yo M with PMH of CAD s/p CABG 12/2022, HTN, HLD, COPD, PVD, and seizures.   Presented to the ED on 8/3 with near syncopal event. Denied chest pain, palpitations, or acute shortness of breath. Since CABG, he has had exertional shortness of breath. EKG initially in NSR, first AV block. Second EKG with 2-1 block with PVC. Repeat EKG with 2-1 likely Wenckebach/Mobitz 1. Cardiology consulted. ECHO 8/3 showed LVEF 25-30%, global hypokinesis, G3DD, RV severely reduced, LA and RA moderately dilated, moderate to severe MR. R/LHC on 8/5 showed severe native 3 vessel disease with 3 patent grafts. RA 15, PA 34, wedge 22, CO 4.5, CI 2.3.  Current HF Medications: Diuretic: furosemide 40 mg IV x 1 ACE/ARB/ARNI: Entresto 24/26 mg BID MRA: spironolactone 25 mg daily  Prior to admission HF Medications: Beta blocker: metoprolol tartrate 12.5 mg BID  Pertinent Lab Values: As of 8/5: Serum creatinine 0.80, BUN 5, Potassium 3.7, Sodium 141, BNP 426.2, Magnesium 2.1, A1c 6.3   Vital Signs: Weight: 160 lbs (admission weight: 160 lbs) Blood pressure: 120-140/80s  Heart rate: 90-100s  I/O: net -1.5L yesterday; net -2.4 since admission  Medication Assistance / Insurance Benefits Check: Does the patient have prescription insurance?  Yes Type of insurance plan: [employer] + Medicaid  Outpatient Pharmacy:  Prior to admission outpatient pharmacy: [facility] Is the patient willing to use [facility] TOC pharmacy at discharge? Yes Is the patient willing to transition their outpatient pharmacy to utilize a [facility] outpatient pharmacy?   No    Assessment: 1. 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 NYHA class II symptoms. BMET pending. - Agree with furosemide 40 mg IV x 1 today.  - Holding metoprolol with bradycardia and near syncope, second degree AV block. Also with new severe RV  dysfunction. - Continue Entresto 24/26 mg BID - Continue spironolactone 25 mg daily - Consider adding SGLT2i prior to discharge  Plan: 1) Medication changes recommended at this time: - Add Jardiance 10 mg daily if BMET stable  2) Patient assistance: - Entresto copay $0 - Farxiga/Jardiance copay $0  3)  Education  - Patient has been educated on current HF medications and potential additions to HF medication regimen - Patient verbalizes understanding that over the next few months, these medication doses may change and more medications may be added to optimize HF regimen - Patient has been educated on basic disease state pathophysiology and goals of therapy   Sharen Hones, PharmD, BCPS Heart Failure Stewardship Pharmacist Phone [phone]

## 2023-04-28 ENCOUNTER — Other Ambulatory Visit (HOSPITAL_COMMUNITY): Payer: Self-pay

## 2023-04-28 DIAGNOSIS — I441 Atrioventricular block, second degree: Secondary | ICD-10-CM | POA: Diagnosis not present

## 2023-04-28 DIAGNOSIS — I429 Cardiomyopathy, unspecified: Secondary | ICD-10-CM | POA: Diagnosis not present

## 2023-04-28 DIAGNOSIS — R55 Syncope and collapse: Secondary | ICD-10-CM | POA: Diagnosis not present

## 2023-04-28 LAB — BASIC METABOLIC PANEL
Anion gap: 14 (ref 5–15)
BUN: 16 mg/dL (ref 8–23)
CO2: 22 mmol/L (ref 22–32)
Calcium: 9.9 mg/dL (ref 8.9–10.3)
Chloride: 99 mmol/L (ref 98–111)
Creatinine, Ser: 1.41 mg/dL — ABNORMAL HIGH (ref 0.61–1.24)
GFR, Estimated: 55 mL/min — ABNORMAL LOW (ref >60)
Glucose, Bld: 81 mg/dL (ref 70–99)
Potassium: 4.4 mmol/L (ref 3.5–5.1)
Sodium: 135 mmol/L (ref 135–145)

## 2023-04-28 LAB — GLUCOSE, CAPILLARY: Glucose-Capillary: 107 mg/dL — ABNORMAL HIGH (ref 70–99)

## 2023-04-28 MED ORDER — FUROSEMIDE 40 MG PO TABS
40.0000 mg | ORAL_TABLET | ORAL | 11 refills | Status: AC | PRN
Start: 2023-04-28 — End: 2024-04-27
  Filled 2023-04-28: qty 30, 30d supply, fill #0

## 2023-04-28 MED ORDER — SACUBITRIL-VALSARTAN 24-26 MG PO TABS
1.0000 | ORAL_TABLET | Freq: Two times a day (BID) | ORAL | 0 refills | Status: AC
  Filled 2023-04-28: qty 60, 30d supply, fill #0

## 2023-04-28 MED ORDER — SPIRONOLACTONE 25 MG PO TABS
25.0000 mg | ORAL_TABLET | Freq: Every day | ORAL | 0 refills | Status: DC
  Filled 2023-04-28: qty 30, 30d supply, fill #0

## 2023-04-28 NOTE — Progress Notes (Signed)
Rounding Note    Patient Name: Donald Leblanc Date of Encounter: 04/28/2023  [facility] Cardiologist: [facility]  Subjective   Doing well today. Reports brisk urine output after 40 mg IV lasix. Was not on diuretic prior to admission.  Inpatient Medications    Scheduled Meds:  aspirin  81 mg Oral Daily   atorvastatin  40 mg Oral Daily   clopidogrel  75 mg Oral Daily   enoxaparin (LOVENOX) injection  40 mg Subcutaneous Q24H   insulin aspart  0-15 Units Subcutaneous TID WC   insulin aspart  0-5 Units Subcutaneous QHS   isosorbide mononitrate  30 mg Oral Daily   sacubitril-valsartan  1 tablet Oral BID   sodium chloride flush  3 mL Intravenous Q12H   sodium chloride flush  3 mL Intravenous Q12H   spironolactone  25 mg Oral Daily   Continuous Infusions:  sodium chloride     PRN Meds: sodium chloride, acetaminophen **OR** acetaminophen, albuterol, HYDROcodone-acetaminophen, polyethylene glycol, sodium chloride flush   Vital Signs    Vitals:   04/27/23 2013 04/28/23 0519 04/28/23 0802 04/28/23 1014  BP: 108/74 113/65 117/88 101/69  Pulse: 95  95   Resp: 18 18 18    Temp: 98.5 F (36.9 C) 97.6 F (36.4 C) 97.7 F (36.5 C)   TempSrc: Oral Oral Oral   SpO2: 94%  96%   Weight:      Height:        Intake/Output Summary (Last 24 hours) at 04/28/2023 1054 Last data filed at 04/28/2023 0000 Gross per 24 hour  Intake 540 ml  Output 1225 ml  Net -685 ml      04/24/2023    5:07 PM 04/24/2023   10:12 AM 01/27/2018    9:37 AM  Last 3 Weights  Weight (lbs) 160 lb 9.6 oz 171 lb 178 lb  Weight (kg) 72.848 kg 77.565 kg 80.74 kg      Telemetry    very rare missed beats, otherwise sinus - Personally Reviewed  ECG    No new since 8/4 - Personally Reviewed  Physical Exam   GEN: Well nourished, well developed in no acute distress NECK: No JVD CARDIAC: regular rhythm, normal S1 and S2, no rubs or gallops. 2/6 systolic murmur. VASCULAR: Radial pulses 2+ bilaterally.   RESPIRATORY:  Clear to auscultation without rales, wheezing or rhonchi  ABDOMEN: Soft, non-tender, non-distended MUSCULOSKELETAL:  Moves all 4 limbs independently SKIN: Warm and dry, no edema NEUROLOGIC:  No focal neuro deficits noted. PSYCHIATRIC:  Normal affect    Labs    High Sensitivity Troponin:   Recent Labs  Lab 04/24/23 1027 04/24/23 1155  TROPONINIHS 17 18*     Chemistry Recent Labs  Lab 04/25/23 0135 04/26/23 1249 04/26/23 1509 04/26/23 1513 04/27/23 0856 04/28/23 0226  NA  --  139   < > 141 136 136  K 3.5 3.4*   < > 3.7 3.6 3.5  CL  --  106  --   --  101 98  CO2  --  24  --   --  25 24  GLUCOSE  --  107*  --   --  106* 106*  BUN  --  5*  --   --  9 18  CREATININE  --  0.80  --   --  1.14 1.39*  CALCIUM  --  9.2  --   --  9.3 9.8  MG 2.1  --   --   --   --   --  PROT 6.5  --   --   --   --   --   ALBUMIN 3.1*  --   --   --   --   --   AST 21  --   --   --   --   --   ALT 29  --   --   --   --   --   ALKPHOS 72  --   --   --   --   --   BILITOT 1.5*  --   --   --   --   --   GFRNONAA  --  >60  --   --  >60 56*  ANIONGAP  --  9  --   --  10 14   < > = values in this interval not displayed.    Lipids No results for input(s): "CHOL", "TRIG", "HDL", "LABVLDL", "LDLCALC", "CHOLHDL" in the last 168 hours.  Hematology Recent Labs  Lab 04/24/23 1027 04/24/23 1732 04/26/23 1509 04/26/23 1513  WBC 6.1 7.8  --   --   RBC 4.64 4.97  --   --   HGB 11.4* 12.4* 16.7 16.7  HCT 37.8* 40.6 49.0 49.0  MCV 81.5 81.7  --   --   MCH 24.6* 24.9*  --   --   MCHC 30.2 30.5  --   --   RDW 17.1* 17.0*  --   --   PLT 374 400  --   --    Thyroid  Recent Labs  Lab 04/25/23 0135  TSH 3.002    BNP Recent Labs  Lab 04/24/23 1027  BNP 426.2*    DDimer  Recent Labs  Lab 04/24/23 1028  DDIMER 0.68*     Radiology    CARDIAC CATHETERIZATION  Result Date: 04/26/2023   Prox RCA lesion is 90% stenosed.   Dist RCA lesion is 100% stenosed.   Prox RCA to Mid RCA  lesion is 50% stenosed.   Ost Cx to Prox Cx lesion is 90% stenosed.   1st Mrg lesion is 10% stenosed.   Mid LAD lesion is 100% stenosed.   SVG graft was visualized by angiography and is normal in caliber.   LIMA graft was visualized by angiography and is normal in caliber. Severe three vessel CAD s/p 3V CABG with 3/3 patent bypass grafts The LAD is occluded in the mid vessel. The free LIMA to the mid LAD is patent (The LIMA was resected and used as a free graft).  I did not see a vein graft to the Diagonal branch. The Circumflex has severe ostial stenosis followed by a patent stented segment. The vein graft to the obtuse marginal branch is patent The RCA is occluded in the mid vessel. The vein graft to the PDA is patent. LVEDP 22 mmHg, PCWP 22 mmHg Recommendations: Medical management of CAD. Mild elevation in filling pressures. He would benefit from further diuresis.    Cardiac Studies   Echo 04/24/23 reviewed Cath 8.5.24 reviewed  Per op note ([facility]) * 4-vessel CABG On Pump - greater saphenous vein graft anastomosed end-to-side to the posterior descending artery from ascending aorta - greater saphenous vein graft anastomosed end-to-side to the first obtuse marginal from ascending aorta - greater saphenous vein graft anastomosed end-to-side to the first diagonal from t-graft off saphenous vein - free left internal mammary artery anastomosed end-to-side to the mid left anterior descending from t-graft off saphenous vein   Patient Profile  67 y.o. male with PMH CAD s/p 4V CABG (12/21/22, at [facility]), PAD, COPD presenting after lightheaded spell.   Assessment & Plan    Near syncope Second degree AV block -holding beta blocker -was outside working in the heat, had not had anything to eat in >12 hours, felt lightheaded and short of breath, had to sit down. No full loss of consciousness -high suspicion for dehydration/vagal episode, but continue to monitor on telemetry -ECG from 04/24/23 at 23:08 shows  brief 2:1 AV block and then PVC. Patient sleeping at the time -ECG from 04/25/23 1:10 AM shows both Wenkebach and 2:1 block -telemetry with very rare missed beats and PVCs. No high degree conduction disease seen in last 24 hours.  Reduced EF -EF 25-30%, was 45-50% after CABG in 12/2022 -RV function also severely reduced -moderate-severe MR now (was mild 12/2022) -no beta blocker given bradycardia/near syncope as above -cath as below -on imdur, continue -started on entresto, spironolactone -Cr up slightly today, did receive lasix yesterday for LVEDP 22 on cath. Will change to PRN oral lasix -no daily weights, suspect I/O inaccurate -follows with a cardiologist at the [facility], discussed need for close follow up -reviewed daily weights  CAD s/p CABGx4 12/21/22 ([facility]) Hypercholesterolemia -continue aspirin, clopidogrel -continue atorvastatin 40 mg daily. LDL 12/21/22 was 139 -cath shows severe native 3V disease with three patent grafts (free LIMA-LAD, SVG-OM, SVG-PDA). The op note mentions SVG-D1 from t-graft but this was not visualized on cath. Even if this graft is occluded, would not expect this severe of a drop in EF.  [facility] will sign off.   Medication Recommendations:  remain off beta blocker. Would start lasix 40 mg daily PRN for weight gain 3 lbs overnight or 5 lbs from baseline. Otherwise continue meds as currently ordered Other recommendations (labs, testing, etc):  recheck BMET at his cardiology visit Follow up as an outpatient:  He follows with [facility] cardiology. He will call to get a close follow up appt with them.   For questions or updates, please contact [facility] Please consult [email or web address] for contact info under        Signed, Jodelle Red, MD  04/28/2023, 10:54 AM

## 2023-04-28 NOTE — Progress Notes (Signed)
Discharge paperwork given. Patient refuse to wear heart monitor until family arrives for pick up

## 2023-04-28 NOTE — Discharge Summary (Signed)
Physician Discharge Summary  Kerman Breech NWG:[id number] DOB: [birth year] DOA: 04/24/2023  PCP: Sondra Come, MD  Admit date: 04/24/2023 Discharge date: 04/28/2023 30 Day Unplanned Readmission Risk Score    Flowsheet Row ED to Hosp-Admission (Current) from 04/24/2023 in [facility]  30 Day Unplanned Readmission Risk Score (%) 8.04 Filed at 04/28/2023 0801       This score is the patient's risk of an unplanned readmission within 30 days of being discharged (0 -100%). The score is based on dignosis, age, lab data, medications, orders, and past utilization.   Low:  0-14.9   Medium: 15-21.9   High: 22-29.9   Extreme: 30 and above          Admitted From: Home Disposition: Home  Recommendations for Outpatient Follow-up:  Follow up with PCP in 1-2 weeks Please obtain BMP/CBC in one week Follow-up with cardiology as scheduled by them Please follow up with your PCP on the following pending results: Unresulted Labs (From admission, onward)     Start     Ordered   05/01/23 0500  Creatinine, serum  (enoxaparin (LOVENOX)    CrCl >/= 30 ml/min)  Weekly,   R     Comments: while on enoxaparin therapy    04/24/23 1318   04/28/23 1101  Basic metabolic panel  ONCE - STAT,   STAT       Question:  Specimen collection method  Answer:  Lab=Lab collect   04/28/23 1100              Home Health: None Equipment/Devices: None  Discharge Condition: Stable  CODE STATUS: Full code Diet recommendation: Cardiac/low-sodium  Subjective: Seen and examined.  No complaints.  No shortness of breath.  Brief/Interim Summary: Donald Leblanc is a 67 y.o. male with a hx of CAD s/p CABG x4 April 2024 (LIMA-LAD, SVG- PDA, SVG- OM, SVG-D1), PAD, COPD, HTN, HLD who presented to ED with shortness of breath as well as near syncope episodes.  After admission, patient was diagnosed with Mobitz 2 heart block as well as bradycardia.  Cardiology was consulted.  Details below.   Near syncope: Could  be cardiac in origin.  Has Mobitz type II heart block.  Beta-blocker discontinued.  Cardiology on board and managing.   History of CAD with CABG x 4 at [facility] April 2024/new onset systolic congestive heart failure: Comes in with some shortness of breath.  Echo now shows 25 to 30% ejection fraction.  Seen by cardiology, has been started on Imdur, Entresto and Aldactone.  Underwent cardiac cath, was found to have clean grafts but had elevated LVEDP, on Plavix .  Cardiology gave her 1 dose of IV Lasix 40 mg yesterday.  This caused some rise in creatinine today.  Cardiology is not much concerned about it and has cleared the patient for discharge.  Their recommendations about Lasix is to take 40 mg as needed daily for weight gain 3 lbs overnight or 5 lbs from baseline    Hyperlipidemia: Continue atorvastatin.   History of COPD: Stable.  Discharge plan was discussed with patient and/or family member and they verbalized understanding and agreed with it.  Discharge Diagnoses:  Principal Problem:   Near syncope Active Problems:   Abnormal CXR   CAD (coronary artery disease)   Acute systolic (congestive) heart failure [facility])    Discharge Instructions   Allergies as of 04/28/2023   No Known Allergies      Medication List     STOP taking these  medications    ibuprofen 200 MG tablet Commonly known as: ADVIL   metoprolol tartrate 25 MG tablet Commonly known as: LOPRESSOR       TAKE these medications    albuterol 108 (90 Base) MCG/ACT inhaler Commonly known as: VENTOLIN HFA Inhale 1-2 puffs into the lungs every 6 (six) hours as needed for wheezing or shortness of breath.   alprostadil 40 MCG injection Commonly known as: EDEX 40 mcg daily as needed for erectile dysfunction. use no more than 3 times per week   ASPIRIN 81 PO Take 1 tablet by mouth daily.   atorvastatin 40 MG tablet Commonly known as: LIPITOR Take 1 tablet (40 mg total) by mouth daily at 6 PM. What changed: when  to take this   benzonatate 100 MG capsule Commonly known as: TESSALON Take 1 capsule (100 mg total) by mouth every 8 (eight) hours. What changed: when to take this   clopidogrel 75 MG tablet Commonly known as: PLAVIX Take 75 mg by mouth daily.   furosemide 40 MG tablet Commonly known as: Lasix Take 1 tablet (40 mg total) by mouth as needed (PRN for weight gain 3 lbs overnight or 5 lbs from baseline).   HYDROcodone-acetaminophen 5-325 MG tablet Commonly known as: NORCO/VICODIN Take 1-2 tablets by mouth every 6 hours as needed for cough. What changed:  how much to take how to take this when to take this additional instructions   isosorbide mononitrate 30 MG 24 hr tablet Commonly known as: IMDUR Take 30 mg by mouth daily.   sacubitril-valsartan 24-26 MG Commonly known as: ENTRESTO Take 1 tablet by mouth 2 (two) times daily.   spironolactone 25 MG tablet Commonly known as: ALDACTONE Take 1 tablet (25 mg total) by mouth daily. Start taking on: April 29, 2023        Follow-up Information     Sondra Come, MD Follow up in 1 week(s).   Specialty: Internal Medicine Contact information: [address] [place] [address]                No Known Allergies  Consultations: Cardiology   Procedures/Studies: CARDIAC CATHETERIZATION  Result Date: 04/26/2023   Prox RCA lesion is 90% stenosed.   Dist RCA lesion is 100% stenosed.   Prox RCA to Mid RCA lesion is 50% stenosed.   Ost Cx to Prox Cx lesion is 90% stenosed.   1st Mrg lesion is 10% stenosed.   Mid LAD lesion is 100% stenosed.   SVG graft was visualized by angiography and is normal in caliber.   LIMA graft was visualized by angiography and is normal in caliber. Severe three vessel CAD s/p 3V CABG with 3/3 patent bypass grafts The LAD is occluded in the mid vessel. The free LIMA to the mid LAD is patent (The LIMA was resected and used as a free graft).  I did not see a vein graft to the Diagonal branch. The  Circumflex has severe ostial stenosis followed by a patent stented segment. The vein graft to the obtuse marginal branch is patent The RCA is occluded in the mid vessel. The vein graft to the PDA is patent. LVEDP 22 mmHg, PCWP 22 mmHg Recommendations: Medical management of CAD. Mild elevation in filling pressures. He would benefit from further diuresis.   VAS [place] LOWER EXTREMITY VENOUS (DVT)  Result Date: 04/25/2023  Lower Venous DVT Study Patient Name:  DALAN LAMOTTE  Date of Exam:   04/25/2023 Medical Rec #: [id number]  Accession #:    [id number] Date of Birth: [birth year]       Patient Gender: M Patient Age:   62 years Exam Location:  [facility] Procedure:      VAS [place] LOWER EXTREMITY VENOUS (DVT) Referring Phys: [facility] GOEL --------------------------------------------------------------------------------  Indications: Ordered for edema; however, multiple notes stating no edema. History of CHF/SOB.  Comparison Study: No prior studies. Performing Technologist: Jean Rosenthal RDMS, RVT  Examination Guidelines: A complete evaluation includes B-mode imaging, spectral Doppler, color Doppler, and power Doppler as needed of all accessible portions of each vessel. Bilateral testing is considered an integral part of a complete examination. Limited examinations for reoccurring indications may be performed as noted. The reflux portion of the exam is performed with the patient in reverse Trendelenburg.  +---------+---------------+---------+-----------+----------+--------------+ RIGHT    CompressibilityPhasicitySpontaneityPropertiesThrombus Aging +---------+---------------+---------+-----------+----------+--------------+ CFV      Full           Yes      Yes                                 +---------+---------------+---------+-----------+----------+--------------+ SFJ      Full                                                         +---------+---------------+---------+-----------+----------+--------------+ FV Prox  Full                                                        +---------+---------------+---------+-----------+----------+--------------+ FV Mid   Full                                                        +---------+---------------+---------+-----------+----------+--------------+ FV DistalFull                                                        +---------+---------------+---------+-----------+----------+--------------+ PFV      Full                                                        +---------+---------------+---------+-----------+----------+--------------+ POP      Full           Yes      Yes                                 +---------+---------------+---------+-----------+----------+--------------+ PTV      Full                                                        +---------+---------------+---------+-----------+----------+--------------+  PERO     Full                                                        +---------+---------------+---------+-----------+----------+--------------+   +---------+---------------+---------+-----------+----------+--------------+ LEFT     CompressibilityPhasicitySpontaneityPropertiesThrombus Aging +---------+---------------+---------+-----------+----------+--------------+ CFV      Full           Yes      Yes                                 +---------+---------------+---------+-----------+----------+--------------+ SFJ      Full                                                        +---------+---------------+---------+-----------+----------+--------------+ FV Prox  Full                                                        +---------+---------------+---------+-----------+----------+--------------+ FV Mid   Full                                                         +---------+---------------+---------+-----------+----------+--------------+ FV DistalFull                                                        +---------+---------------+---------+-----------+----------+--------------+ PFV      Full                                                        +---------+---------------+---------+-----------+----------+--------------+ POP      Full           Yes      Yes                                 +---------+---------------+---------+-----------+----------+--------------+ PTV      Full                                                        +---------+---------------+---------+-----------+----------+--------------+ PERO     Full                                                        +---------+---------------+---------+-----------+----------+--------------+  Summary: RIGHT: - There is no evidence of deep vein thrombosis in the lower extremity.  - No cystic structure found in the popliteal fossa.  LEFT: - There is no evidence of deep vein thrombosis in the lower extremity.  - No cystic structure found in the popliteal fossa.  *See table(s) above for measurements and observations. Electronically signed by Heath Lark on 04/25/2023 at 5:01:29 PM.    Final    ECHOCARDIOGRAM COMPLETE  Result Date: 04/24/2023    ECHOCARDIOGRAM REPORT   Patient Name:   Gionnie Hodak Date of Exam: 04/24/2023 Medical Rec #:  [id number]       Height:       70.5 in Accession #:    [id number]      Weight:       171.0 lb Date of Birth:  1956-06-16      BSA:          1.963 m Patient Age:    66 years        BP:           158/97 mmHg Patient Gender: M               HR:           80 bpm. Exam Location:  Inpatient Procedure: 2D Echo, Cardiac Doppler and Color Doppler Indications:    CHF-Acute Systolic I50.21  History:        Patient has no prior history of Echocardiogram examinations.                 CAD; COPD. Hx of substance abuse (HCC) (From Hx). CABG April                  2024 per patient.  Sonographer:    Celesta Gentile RCS Referring Phys: [facility] GOEL IMPRESSIONS  1. Left ventricular ejection fraction, by estimation, is 25 to 30%. The left ventricle has severely decreased function. The left ventricle demonstrates global hypokinesis. The left ventricular internal cavity size was mildly to moderately dilated. Left ventricular diastolic parameters are consistent with Grade III diastolic dysfunction (restrictive).  2. Right ventricular systolic function is severely reduced. The right ventricular size is normal.  3. Left atrial size was moderately dilated.  4. Right atrial size was moderately dilated.  5. The mitral valve is normal in structure. Moderate to severe mitral valve regurgitation. No evidence of mitral stenosis.  6. The aortic valve is normal in structure. Aortic valve regurgitation is not visualized. No aortic stenosis is present.  7. The inferior vena cava is normal in size with greater than 50% respiratory variability, suggesting right atrial pressure of 3 mmHg. FINDINGS  Left Ventricle: Left ventricular ejection fraction, by estimation, is 25 to 30%. The left ventricle has severely decreased function. The left ventricle demonstrates global hypokinesis. Definity contrast agent was given IV to delineate the left ventricular endocardial borders. The left ventricular internal cavity size was mildly to moderately dilated. There is no left ventricular hypertrophy. Left ventricular diastolic parameters are consistent with Grade III diastolic dysfunction (restrictive). Right Ventricle: The right ventricular size is normal. No increase in right ventricular wall thickness. Right ventricular systolic function is severely reduced. Left Atrium: Left atrial size was moderately dilated. Right Atrium: Right atrial size was moderately dilated. Pericardium: There is no evidence of pericardial effusion. Mitral Valve: The mitral valve is normal in structure. Moderate to severe mitral valve  regurgitation, with centrally-directed jet. No evidence of mitral valve stenosis. Tricuspid Valve: The tricuspid valve  is normal in structure. Tricuspid valve regurgitation is mild . No evidence of tricuspid stenosis. Aortic Valve: The aortic valve is normal in structure. Aortic valve regurgitation is not visualized. No aortic stenosis is present. Pulmonic Valve: The pulmonic valve was not well visualized. Pulmonic valve regurgitation is trivial. No evidence of pulmonic stenosis. Aorta: The aortic root is normal in size and structure. Venous: The inferior vena cava is normal in size with greater than 50% respiratory variability, suggesting right atrial pressure of 3 mmHg. IAS/Shunts: No atrial level shunt detected by color flow Doppler.  LEFT VENTRICLE PLAX 2D LVIDd:         5.00 cm      Diastology LVIDs:         4.40 cm      LV e' medial:    4.57 cm/s LV PW:         0.90 cm      LV E/e' medial:  27.1 LV IVS:        1.00 cm      LV e' lateral:   4.57 cm/s LVOT diam:     1.60 cm      LV E/e' lateral: 27.1 LV SV:         27 LV SV Index:   14 LVOT Area:     2.01 cm  LV Volumes (MOD) LV vol d, MOD A2C: 119.0 ml LV vol d, MOD A4C: 104.0 ml LV vol s, MOD A2C: 75.0 ml LV vol s, MOD A4C: 75.3 ml LV SV MOD A2C:     44.0 ml LV SV MOD A4C:     104.0 ml LV SV MOD BP:      38.6 ml RIGHT VENTRICLE RV S prime:     6.20 cm/s TAPSE (M-mode): 0.9 cm LEFT ATRIUM             Index        RIGHT ATRIUM           Index LA diam:        3.90 cm 1.99 cm/m   RA Area:     14.60 cm LA Vol (A2C):   47.1 ml 23.99 ml/m  RA Volume:   35.80 ml  18.24 ml/m LA Vol (A4C):   58.5 ml 29.80 ml/m LA Biplane Vol: 57.3 ml 29.19 ml/m  AORTIC VALVE LVOT Vmax:   68.70 cm/s LVOT Vmean:  45.400 cm/s LVOT VTI:    0.132 m  AORTA Ao Root diam: 3.00 cm MITRAL VALVE                TRICUSPID VALVE MV Area (PHT): 7.44 cm     TR Peak grad:   36.0 mmHg MV Decel Time: 102 msec     TR Vmax:        300.00 cm/s MR Peak grad: 91.0 mmHg MR Mean grad: 59.0 mmHg      SHUNTS MR Vmax:      477.00 cm/s   Systemic VTI:  0.13 m MR Vmean:     366.0 cm/s    Systemic Diam: 1.60 cm MV E velocity: 124.00 cm/s MV A velocity: 56.60 cm/s MV E/A ratio:  2.19 Arvilla Meres MD Electronically signed by Arvilla Meres MD Signature Date/Time: 04/24/2023/7:22:28 PM    Final    CT CHEST WO CONTRAST  Result Date: 04/24/2023 CLINICAL DATA:  Diffuse interstitial lung disease EXAM: CT CHEST WITHOUT CONTRAST TECHNIQUE: Multidetector CT imaging of the chest was performed following the standard protocol without IV  contrast. RADIATION DOSE REDUCTION: This exam was performed according to the departmental dose-optimization program which includes automated exposure control, adjustment of the mA and/or kV according to patient size and/or use of iterative reconstruction technique. COMPARISON:  06/03/2009.  Chest x-ray today FINDINGS: Cardiovascular: Heart is mildly enlarged. Prior CABG. Aortic atherosclerosis. No aneurysm. Mediastinum/Nodes: Borderline mediastinal lymph nodes. Prevascular lymph node has a short axis diameter of 9 mm. Right paratracheal lymph node has a short axis diameter of 9 mm. No visible hilar or axillary adenopathy. Trachea and esophagus are unremarkable. Thyroid unremarkable. Lungs/Pleura: Bronchiectasis and cystic changes/scarring in the right upper lobe and superior segment of the right lower lobe, unchanged. Right calcified pleural plaques in the lower right chest. Small left pleural effusion. No confluent airspace opacities on the left. Upper Abdomen: No acute findings Musculoskeletal: Chest wall soft tissues are unremarkable. No acute bony abnormality. IMPRESSION: Stable bronchiectasis and scarring in the right upper lobe and right lower lobe. Pleural thickening and calcified pleural plaques on the right, stable. Small left pleural effusion. Cardiomegaly. Aortic Atherosclerosis (ICD10-I70.0). Electronically Signed   By: Charlett Nose M.D.   On: 04/24/2023 17:06   DG Chest 1  View  Result Date: 04/24/2023 CLINICAL DATA:  SOB EXAM: CHEST  1 VIEW COMPARISON:  CXR 01/14/18 FINDINGS: Status post median sternotomy and CABG. Cardiomegaly.No pleural effusion. No pneumothorax. Unchanged linear opacity in the right upper lobe. There is a new hazy opacity at the right lung base, which could represent atelectasis or infection. No radiographically apparent displaced rib fractures. Visualized upper abdomen is unremarkable. IMPRESSION: 1. New hazy opacity at the right lung base, which could represent atelectasis or infection. 2. Cardiomegaly. If there is clinical concern for a pericardial effusion, further evaluation with echocardiography is recommended. Electronically Signed   By: Lorenza Cambridge M.D.   On: 04/24/2023 11:14     Discharge Exam: Vitals:   04/28/23 0802 04/28/23 1014  BP: 117/88 101/69  Pulse: 95   Resp: 18   Temp: 97.7 F (36.5 C)   SpO2: 96%    Vitals:   04/27/23 2013 04/28/23 0519 04/28/23 0802 04/28/23 1014  BP: 108/74 113/65 117/88 101/69  Pulse: 95  95   Resp: 18 18 18    Temp: 98.5 F (36.9 C) 97.6 F (36.4 C) 97.7 F (36.5 C)   TempSrc: Oral Oral Oral   SpO2: 94%  96%   Weight:      Height:        General: Pt is alert, awake, not in acute distress Cardiovascular: RRR, S1/S2 +, no rubs, no gallops Respiratory: CTA bilaterally, no wheezing, no rhonchi Abdominal: Soft, NT, ND, bowel sounds + Extremities: no edema, no cyanosis    The results of significant diagnostics from this hospitalization (including imaging, microbiology, ancillary and laboratory) are listed below for reference.     Microbiology: No results found for this or any previous visit (from the past 240 hour(s)).   Labs: BNP (last 3 results) Recent Labs    04/24/23 1027  BNP 426.2*   Basic Metabolic Panel: Recent Labs  Lab 04/24/23 1027 04/24/23 1732 04/25/23 0135 04/26/23 1249 04/26/23 1509 04/26/23 1513 04/27/23 0856 04/28/23 0226  NA 139  --   --  139 143 141  136 136  K 3.9  --  3.5 3.4* 3.5 3.7 3.6 3.5  CL 106  --   --  106  --   --  101 98  CO2 23  --   --  24  --   --  25 24  GLUCOSE 106*  --   --  107*  --   --  106* 106*  BUN 7*  --   --  5*  --   --  9 18  CREATININE 1.11 1.03  --  0.80  --   --  1.14 1.39*  CALCIUM 8.8*  --   --  9.2  --   --  9.3 9.8  MG  --   --  2.1  --   --   --   --   --    Liver Function Tests: Recent Labs  Lab 04/25/23 0135  AST 21  ALT 29  ALKPHOS 72  BILITOT 1.5*  PROT 6.5  ALBUMIN 3.1*   No results for input(s): "LIPASE", "AMYLASE" in the last 168 hours. No results for input(s): "AMMONIA" in the last 168 hours. CBC: Recent Labs  Lab 04/24/23 1027 04/24/23 1732 04/26/23 1509 04/26/23 1513  WBC 6.1 7.8  --   --   HGB 11.4* 12.4* 16.7 16.7  HCT 37.8* 40.6 49.0 49.0  MCV 81.5 81.7  --   --   PLT 374 400  --   --    Cardiac Enzymes: No results for input(s): "CKTOTAL", "CKMB", "CKMBINDEX", "TROPONINI" in the last 168 hours. BNP: Invalid input(s): "POCBNP" CBG: Recent Labs  Lab 04/27/23 0837 04/27/23 1259 04/27/23 1604 04/27/23 2136 04/28/23 0801  GLUCAP 137* 126* 123* 124* 107*   D-Dimer No results for input(s): "DDIMER" in the last 72 hours. Hgb A1c No results for input(s): "HGBA1C" in the last 72 hours. Lipid Profile No results for input(s): "CHOL", "HDL", "LDLCALC", "TRIG", "CHOLHDL", "LDLDIRECT" in the last 72 hours. Thyroid function studies No results for input(s): "TSH", "T4TOTAL", "T3FREE", "THYROIDAB" in the last 72 hours.  Invalid input(s): "FREET3" Anemia work up No results for input(s): "VITAMINB12", "FOLATE", "FERRITIN", "TIBC", "IRON", "RETICCTPCT" in the last 72 hours. Urinalysis    Component Value Date/Time   COLORURINE YELLOW 04/24/2023 1307   APPEARANCEUR CLEAR 04/24/2023 1307   LABSPEC 1.026 04/24/2023 1307   PHURINE 5.0 04/24/2023 1307   GLUCOSEU NEGATIVE 04/24/2023 1307   HGBUR NEGATIVE 04/24/2023 1307   BILIRUBINUR NEGATIVE 04/24/2023 1307   KETONESUR  NEGATIVE 04/24/2023 1307   PROTEINUR 100 (A) 04/24/2023 1307   UROBILINOGEN 1.0 06/03/2009 0412   NITRITE NEGATIVE 04/24/2023 1307   LEUKOCYTESUR NEGATIVE 04/24/2023 1307   Sepsis Labs Recent Labs  Lab 04/24/23 1027 04/24/23 1732  WBC 6.1 7.8   Microbiology No results found for this or any previous visit (from the past 240 hour(s)).  FURTHER DISCHARGE INSTRUCTIONS:   Get Medicines reviewed and adjusted: Please take all your medications with you for your next visit with your Primary MD   Laboratory/radiological data: Please request your Primary MD to go over all hospital tests and procedure/radiological results at the follow up, please ask your Primary MD to get all Hospital records sent to his/her office.   In some cases, they will be blood work, cultures and biopsy results pending at the time of your discharge. Please request that your primary care M.D. goes through all the records of your hospital data and follows up on these results.   Also Note the following: If you experience worsening of your admission symptoms, develop shortness of breath, life threatening emergency, suicidal or homicidal thoughts you must seek medical attention immediately by calling 911 or calling your MD immediately  if symptoms less severe.   You must read complete instructions/literature along with all the  possible adverse reactions/side effects for all the Medicines you take and that have been prescribed to you. Take any new Medicines after you have completely understood and accpet all the possible adverse reactions/side effects.    Do not drive when taking Pain medications or sleeping medications (Benzodaizepines)   Do not take more than prescribed Pain, Sleep and Anxiety Medications. It is not advisable to combine anxiety,sleep and pain medications without talking with your primary care practitioner   Special Instructions: If you have smoked or chewed Tobacco  in the last 2 yrs please stop smoking,  stop any regular Alcohol  and or any Recreational drug use.   Wear Seat belts while driving.   Please note: You were cared for by a hospitalist during your hospital stay. Once you are discharged, your primary care physician will handle any further medical issues. Please note that NO REFILLS for any discharge medications will be authorized once you are discharged, as it is imperative that you return to your primary care physician (or establish a relationship with a primary care physician if you do not have one) for your post hospital discharge needs so that they can reassess your need for medications and monitor your lab values  Time coordinating discharge: Over 30 minutes  SIGNED:   Hughie Closs, MD  [facility] 04/28/2023, 11:14 AM *Please note that this is a verbal dictation therefore any spelling or grammatical errors are due to the "[employer] One" system interpretation. If 7PM-7AM, please contact night-coverage [email or web address]

## 2023-04-28 NOTE — Progress Notes (Signed)
   Heart Failure Stewardship Pharmacist Progress Note   PCP: Sondra Come, MD PCP-Cardiologist: None    HPI:  67 yo M with PMH of CAD s/p CABG 12/2022, HTN, HLD, COPD, PVD, and seizures.   Presented to the ED on 8/3 with near syncopal event. Denied chest pain, palpitations, or acute shortness of breath. Since CABG, he has had exertional shortness of breath. EKG initially in NSR, first AV block. Second EKG with 2-1 block with PVC. Repeat EKG with 2-1 likely Wenckebach/Mobitz 1. Cardiology consulted. ECHO 8/3 showed LVEF 25-30%, global hypokinesis, G3DD, RV severely reduced, LA and RA moderately dilated, moderate to severe MR. R/LHC on 8/5 showed severe native 3 vessel disease with 3 patent grafts. RA 15, PA 34, wedge 22, CO 4.5, CI 2.3.  Current HF Medications: ACE/ARB/ARNI: Entresto 24/26 mg BID MRA: spironolactone 25 mg daily  Prior to admission HF Medications: Beta blocker: metoprolol tartrate 12.5 mg BID  Pertinent Lab Values: Serum creatinine 1.39, BUN 18, Potassium 3.5, Sodium 136, BNP 426.2, Magnesium 2.1, A1c 6.3   Vital Signs: Weight: 160 lbs (admission weight: 160 lbs) Blood pressure: 110/80s  Heart rate: 80-90s  I/O: net -0.2L yesterday; net -2.8L since admission  Medication Assistance / Insurance Benefits Check: Does the patient have prescription insurance?  Yes Type of insurance plan: [facility] + Medicaid  Outpatient Pharmacy:  Prior to admission outpatient pharmacy: [facility] Is the patient willing to use [facility] TOC pharmacy at discharge? Yes Is the patient willing to transition their outpatient pharmacy to utilize a [facility] outpatient pharmacy?   No    Assessment: 1. 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 NYHA class II symptoms. BMET pending. - Holding metoprolol with bradycardia and near syncope, second degree AV block. Also with new severe RV dysfunction. - Continue Entresto 24/26 mg BID - Continue spironolactone 25 mg daily - Consider adding  SGLT2i prior to discharge if renal function improves. Creatinine 0.8 on 8/5 >> 1.39 today  Plan: 1) Medication changes recommended at this time: - Continue current regimen  2) Patient assistance: - Entresto copay $0 - Farxiga/Jardiance copay $0  3)  Education  - Patient has been educated on current HF medications and potential additions to HF medication regimen - Patient verbalizes understanding that over the next few months, these medication doses may change and more medications may be added to optimize HF regimen - Patient has been educated on basic disease state pathophysiology and goals of therapy   Sharen Hones, PharmD, BCPS Heart Failure Stewardship Pharmacist Phone [phone]

## 2023-09-17 DIAGNOSIS — J479 Bronchiectasis, uncomplicated: Secondary | ICD-10-CM | POA: Diagnosis not present

## 2023-09-17 DIAGNOSIS — I441 Atrioventricular block, second degree: Secondary | ICD-10-CM | POA: Diagnosis not present

## 2023-09-17 DIAGNOSIS — Z114 Encounter for screening for human immunodeficiency virus [HIV]: Secondary | ICD-10-CM | POA: Diagnosis not present

## 2023-09-17 DIAGNOSIS — E782 Mixed hyperlipidemia: Secondary | ICD-10-CM | POA: Diagnosis not present

## 2023-09-17 DIAGNOSIS — E261 Secondary hyperaldosteronism: Secondary | ICD-10-CM | POA: Diagnosis not present

## 2023-09-17 DIAGNOSIS — J449 Chronic obstructive pulmonary disease, unspecified: Secondary | ICD-10-CM | POA: Diagnosis not present

## 2023-09-17 DIAGNOSIS — I25709 Atherosclerosis of coronary artery bypass graft(s), unspecified, with unspecified angina pectoris: Secondary | ICD-10-CM | POA: Diagnosis not present

## 2023-09-17 DIAGNOSIS — Z79899 Other long term (current) drug therapy: Secondary | ICD-10-CM | POA: Diagnosis not present

## 2023-09-17 DIAGNOSIS — Z1159 Encounter for screening for other viral diseases: Secondary | ICD-10-CM | POA: Diagnosis not present

## 2023-09-17 DIAGNOSIS — Z136 Encounter for screening for cardiovascular disorders: Secondary | ICD-10-CM | POA: Diagnosis not present

## 2023-09-17 DIAGNOSIS — I700 Atherosclerosis of aorta: Secondary | ICD-10-CM | POA: Diagnosis not present

## 2023-11-30 ENCOUNTER — Other Ambulatory Visit (HOSPITAL_COMMUNITY): Payer: Self-pay

## 2023-12-01 ENCOUNTER — Other Ambulatory Visit (HOSPITAL_COMMUNITY): Payer: Self-pay

## 2024-01-19 ENCOUNTER — Emergency Department (HOSPITAL_COMMUNITY)
Admission: EM | Admit: 2024-01-19 | Discharge: 2024-01-19 | Disposition: A | Discharge status: Home / Self Care | Attending: Emergency Medicine | Admitting: Emergency Medicine

## 2024-01-19 ENCOUNTER — Other Ambulatory Visit: Payer: Self-pay

## 2024-01-19 DIAGNOSIS — J449 Chronic obstructive pulmonary disease, unspecified: Secondary | ICD-10-CM | POA: Insufficient documentation

## 2024-01-19 DIAGNOSIS — Z7982 Long term (current) use of aspirin: Secondary | ICD-10-CM | POA: Insufficient documentation

## 2024-01-19 DIAGNOSIS — I509 Heart failure, unspecified: Secondary | ICD-10-CM | POA: Insufficient documentation

## 2024-01-19 DIAGNOSIS — M79605 Pain in left leg: Secondary | ICD-10-CM | POA: Diagnosis present

## 2024-01-19 DIAGNOSIS — I2510 Atherosclerotic heart disease of native coronary artery without angina pectoris: Secondary | ICD-10-CM | POA: Insufficient documentation

## 2024-01-19 MED ORDER — ACETAMINOPHEN 500 MG PO TABS
1000.0000 mg | ORAL_TABLET | Freq: Once | ORAL | Status: AC
  Administered 2024-01-19: 1000 mg via ORAL
  Filled 2024-01-19: qty 2

## 2024-01-19 NOTE — Discharge Instructions (Addendum)
 Evaluation for your leg pain was overall reassuring.  There is significant stenosis which is pinching or occlusion in the arteries in both left and right legs per the recent CT angio performed yesterday at another facility.  Please follow-up with vascular surgery.  If your legs become cold to touch, pain is much worse, you start to experience numbness in your legs, chest pain or shortness of breath or any other concerning symptom please return to the ED for further evaluation.

## 2024-01-19 NOTE — ED Provider Notes (Signed)
 [place] EMERGENCY DEPARTMENT AT [facility] Provider Note   CSN: [id number] Arrival date & time: 01/19/24  1540     History  Chief Complaint  Patient presents with   Leg Pain    Pt arrives c c/o L leg pain, ongoing for the last three months. Pt denies paresthesias in legs "it just hurts" steady gait into triage room. Was seen at PCP yesterday c xrays nothing was found   HPI Donald Leblanc is a 68 y.o. male with h/o CAD, CHF, COPD, PVD, seizures presenting for leg pain.  Started about 3 months ago.  The pain originates in the upper portion of the left thigh and radiates down to the mid shin approximately.  Denies chest pain and shortness of breath.  States he was seen at [facility] ED yesterday and that the ultrasound of his leg was normal.  Denies trauma to the leg.   Leg Pain      Home Medications Prior to Admission medications   Medication Sig Start Date End Date Taking? Authorizing Provider  albuterol  (PROVENTIL  HFA;VENTOLIN  HFA) 108 (90 Base) MCG/ACT inhaler Inhale 1-2 puffs into the lungs every 6 (six) hours as needed for wheezing or shortness of breath. 12/15/17   Pisciotta, Peterson Brandt, PA-C  alprostadil (EDEX) 40 MCG injection 40 mcg daily as needed for erectile dysfunction. use no more than 3 times per week    [provider]  ASPIRIN  81 PO Take 1 tablet by mouth daily.    [provider]  atorvastatin  (LIPITOR) 40 MG tablet Take 1 tablet (40 mg total) by mouth daily at 6 PM. Patient taking differently: Take 40 mg by mouth daily. 01/01/18   Colin Dawley, MD  benzonatate  (TESSALON ) 100 MG capsule Take 1 capsule (100 mg total) by mouth every 8 (eight) hours. Patient taking differently: Take 100 mg by mouth daily. 11/30/15   Kirichenko, Tatyana, PA-C  clopidogrel  (PLAVIX ) 75 MG tablet Take 75 mg by mouth daily.    [provider]  furosemide  (LASIX ) 40 MG tablet Take 1 tablet (40 mg total) by mouth as needed (as needed for weight gain 3 lbs  overnight or 5 lbs from baseline). 04/28/23 04/27/24  Modena Andes, MD  HYDROcodone -acetaminophen  (NORCO/VICODIN) 5-325 MG tablet Take 1-2 tablets by mouth every 6 hours as needed for cough. Patient taking differently: Take 1 tablet by mouth 3 (three) times daily. 12/15/17   Pisciotta, Peterson Brandt, PA-C  isosorbide  mononitrate (IMDUR ) 30 MG 24 hr tablet Take 30 mg by mouth daily.    [provider]  spironolactone  (ALDACTONE ) 25 MG tablet Take 1 tablet (25 mg total) by mouth daily. 04/29/23 05/29/23  Modena Andes, MD      Allergies    Patient has no known allergies.    Review of Systems   See HPI  Physical Exam Updated Vital Signs BP (!) 146/82 (BP Location: Left Arm)   Pulse 95   Temp 97.8 F (36.6 C) (Oral)   Resp 16   SpO2 98%  Physical Exam Constitutional:      Appearance: Normal appearance.  HENT:     Head: Normocephalic.     Nose: Nose normal.  Eyes:     Conjunctiva/sclera: Conjunctivae normal.  Pulmonary:     Effort: Pulmonary effort is normal.  Musculoskeletal:     Right lower leg: No swelling. No edema.     Left lower leg: No swelling. No edema.     Right ankle: No swelling. Normal pulse.     Left  ankle: No swelling. Normal pulse.     Comments: Pedal pulses are 2+ bilaterally.  Cap refill in the toes bilaterally is brisk.  The legs ankles and feet are warm to touch.  Neurological:     Mental Status: He is alert.  Psychiatric:        Mood and Affect: Mood normal.     ED Results / Procedures / Treatments   Labs (all labs ordered are listed, but only abnormal results are displayed) Labs Reviewed - No data to display  EKG None  Radiology No results found.  Procedures Procedures    Medications Ordered in ED Medications  acetaminophen  (TYLENOL ) tablet 1,000 mg (has no administration in time range)    ED Course/ Medical Decision Making/ A&P                                 Medical Decision Making  68 year old well-appearing male presenting for left  leg pain.  Exam was unremarkable.  Both legs are warm and well-perfused.  I did review his recent left leg ultrasound at OSH which was negative for DVT.  CT angio of the abdominal aorta and bilateral iliofemoral runoff performed yesterday at OSH revealed arterial stenosis in both legs.  Notably on the left leg there is moderate to severe proximal stenosis of the popliteal artery.  This could be contributing to his ongoing pain.  Overall his legs are neurovascularly intact and warm and well-perfused.  Do not feel there is an emergent vascular issue at this time but advised patient that he does need to follow-up with vascular in the outpatient setting.  Discussed return precautions.  He did receive a 30-day supply of Norco from an OSH.  Advised and follow-up vascular surgery.  Discharged in good condition.        Final Clinical Impression(s) / ED Diagnoses Final diagnoses:  Left leg pain    Rx / DC Orders ED Discharge Orders     None         Janalee Mcmurray, PA-C 01/19/24 1714    Guadalupe Lee, MD 01/21/24 [phone]

## 2024-02-16 ENCOUNTER — Other Ambulatory Visit: Payer: Self-pay

## 2024-02-16 DIAGNOSIS — I739 Peripheral vascular disease, unspecified: Secondary | ICD-10-CM

## 2024-03-02 NOTE — Progress Notes (Signed)
 Patient ID: Donald Leblanc, male   DOB: May 24, 1956, 68 y.o.   MRN: [id number]  Reason for Consult: New Patient (Initial Visit)   Referred by Nolberto Batty, DO  Subjective:     HPI Donald Leblanc is a 68 y.o. male presenting for evaluation of leg pain.  He's had worsening left leg pain over the last 3 months and has a previous history of a left popliteal stent.  He denies foot or calf pain.  He reports that most of his pain is in his left thigh and its constant.  He also has significant low back pain he says he cannot lay flat on his back without terrible pain.  He denies calf claudication, rest pain or slow/nonhealing wounds.  He is a former smoker.  Past Medical History:  Diagnosis Date   Back pain    Colon polyps    COPD (chronic obstructive pulmonary disease) (HCC)    Coronary artery disease    Hx of substance abuse (HCC)    PVD (peripheral vascular disease) (HCC)    Seizures (HCC)    Thrombocytosis    Family History  Problem Relation Age of Onset   Cancer Mother        Unknown   Colon cancer Neg Hx    Pancreatic cancer Neg Hx    Past Surgical History:  Procedure Laterality Date   CORONARY ANGIOPLASTY WITH STENT PLACEMENT     ESOPHAGOGASTRODUODENOSCOPY (EGD) WITH PROPOFOL  N/A 12/30/2017   Procedure: ESOPHAGOGASTRODUODENOSCOPY (EGD) WITH PROPOFOL ;  Surgeon: Lajuan Pila, MD;  Location: [facility] ENDOSCOPY;  Service: Endoscopy;  Laterality: N/A;   HAND SURGERY     RIGHT/LEFT HEART CATH AND CORONARY/GRAFT ANGIOGRAPHY N/A 04/26/2023   Procedure: RIGHT/LEFT HEART CATH AND CORONARY/GRAFT ANGIOGRAPHY;  Surgeon: Odie Benne, MD;  Location: [facility] INVASIVE CV LAB;  Service: Cardiovascular;  Laterality: N/A;    Short Social History:  Social History   Tobacco Use   Smoking status: Former    Current packs/day: 0.00    Average packs/day: 1.5 packs/day for 20.0 years (30.0 ttl pk-yrs)    Types: Cigarettes    Start date: 12/23/1977    Quit date: 12/23/1997    Years since  quitting: 26.2   Smokeless tobacco: Never  Substance Use Topics   Alcohol use: No    No Known Allergies  Current Outpatient Medications  Medication Sig Dispense Refill   albuterol  (PROVENTIL  HFA;VENTOLIN  HFA) 108 (90 Base) MCG/ACT inhaler Inhale 1-2 puffs into the lungs every 6 (six) hours as needed for wheezing or shortness of breath. 1 Inhaler 0   alprostadil (EDEX) 40 MCG injection 40 mcg daily as needed for erectile dysfunction. use no more than 3 times per week     ASPIRIN  81 PO Take 1 tablet by mouth daily.     atorvastatin  (LIPITOR) 40 MG tablet Take 1 tablet (40 mg total) by mouth daily at 6 PM. (Patient taking differently: Take 40 mg by mouth daily.) 30 tablet 2   benzonatate  (TESSALON ) 100 MG capsule Take 1 capsule (100 mg total) by mouth every 8 (eight) hours. (Patient taking differently: Take 100 mg by mouth daily.) 21 capsule 0   clopidogrel  (PLAVIX ) 75 MG tablet Take 75 mg by mouth daily.     furosemide  (LASIX ) 40 MG tablet Take 1 tablet (40 mg total) by mouth as needed (as needed for weight gain 3 lbs overnight or 5 lbs from baseline). 30 tablet 11   HYDROcodone -acetaminophen  (NORCO/VICODIN) 5-325 MG tablet Take 1-2 tablets by mouth  every 6 hours as needed for cough. (Patient taking differently: Take 1 tablet by mouth 3 (three) times daily.) 11 tablet 0   isosorbide  mononitrate (IMDUR ) 30 MG 24 hr tablet Take 30 mg by mouth daily.     spironolactone  (ALDACTONE ) 25 MG tablet Take 1 tablet (25 mg total) by mouth daily. 30 tablet 0   No current facility-administered medications for this visit.    REVIEW OF SYSTEMS  All other systems were reviewed and are negative     Objective:  Objective   Vitals:   03/03/24 0956  BP: (!) 145/88  Pulse: 65  Resp: 18  Temp: (!) 97.1 F (36.2 C)  TempSrc: Temporal  SpO2: 98%  Weight: 171 lb 8 oz (77.8 kg)  Height: 5' 10.5 (1.791 m)   Body mass index is 24.26 kg/m.  Physical Exam General: no acute distress Cardiac:  hemodynamically stable Pulm: normal work of breathing Abdomen: non-tender, no pulsatile mass Neuro: alert, no focal deficit Extremities: no edema, cyanosis or wounds Vascular:   Right: Palpable femoral, DP  Left: Palpable femoral, DP  Data: ABI ABI Findings:  +---------+------------------+-----+----------+--------+  Right   Rt Pressure (mmHg)IndexWaveform  Comment   +---------+------------------+-----+----------+--------+  Brachial 151                                        +---------+------------------+-----+----------+--------+  PTA     144               0.95 biphasic            +---------+------------------+-----+----------+--------+  DP      139               0.92 monophasic          +---------+------------------+-----+----------+--------+  Great Toe113               0.75 Normal              +---------+------------------+-----+----------+--------+   +---------+------------------+-----+-------------------+-------+  Left    Lt Pressure (mmHg)IndexWaveform           Comment  +---------+------------------+-----+-------------------+-------+  Brachial 147                                                +---------+------------------+-----+-------------------+-------+  PTA     128               0.85 monophasic                  +---------+------------------+-----+-------------------+-------+  DP      136               0.90 dampened monophasic         +---------+------------------+-----+-------------------+-------+  Great Toe111               0.74 Normal                      +---------+------------------+-----+-------------------+-------+   BMP reviewed Cr 0.95  Reviewed CTA from [facility] RIGHT PELVIS/LEG  Common iliac artery: Diffuse atherosclerotic disease.  External iliac artery: Severe proximal stenosis.  Mild to moderate mid to distal stenosis.  Internal iliac arteries: Unremarkable  Common femoral artery: Unremarkable   Profunda femoral artery: Unremarkable  Superficial femoral artery: Diffuse atherosclerotic disease with  moderate mid to distal stenoses.  Popliteal artery: Proximal stent, patent.  Diffuse atherosclerotic disease.  Tibial peroneal trunk: Diffuse atherosclerotic disease.  Anterior tibial artery: Proximal occlusion with distal reconstitution  Posterior tibial artery: Unremarkable  Peroneal artery: Unremarkable  Dorsalis pedis artery: Patent   LEFT PELVIS/LEG  Common iliac artery: Unremarkable  External iliac artery: Unremarkable  Internal iliac arteries: Unremarkable  Common femoral artery: Unremarkable  Profunda femoral artery: Unremarkable  Superficial femoral artery: Diffuse atherosclerotic disease.  Popliteal artery: Moderate to severe proximal stenosis.  Proximal stent, patent.  Tibial peroneal trunk: Diffuse atherosclerotic disease.  Anterior tibial artery: Proximal occlusion with distal reconstitution.  Posterior tibial artery: Unremarkable  Peroneal artery: Unremarkable  Dorsalis pedis artery: Patent      Assessment/Plan:   Daiel Strohecker is a 68 y.o. male with PAD and a previous placed left popliteal stent at [facility] a few years ago.  He now has been having significant left thigh pain over the past 6 months that has been constant.  He also has significant back pain.  I explained that he has normal perfusion to his feet and his symptoms seem to likely be stemming from his back with possible nerve compression causing referred pain to his left upper leg.  Will refer to a spine specialist. For his PAD we will plan to follow-up in 12 months with an ABI and left lower extremity duplex He is compliant with best medical therapy on aspirin , Plavix  and Lipitor  Philipp Brawn MD [facility]

## 2024-03-03 ENCOUNTER — Ambulatory Visit (INDEPENDENT_AMBULATORY_CARE_PROVIDER_SITE_OTHER): Admitting: Vascular Surgery

## 2024-03-03 ENCOUNTER — Ambulatory Visit (HOSPITAL_COMMUNITY)
Admission: RE | Admit: 2024-03-03 | Discharge: 2024-03-03 | Disposition: A | Discharge status: Home / Self Care | Source: Ambulatory Visit | Attending: Vascular Surgery | Admitting: Vascular Surgery

## 2024-03-03 ENCOUNTER — Encounter: Payer: Self-pay | Admitting: Vascular Surgery

## 2024-03-03 VITALS — BP 145/88 | HR 65 | Temp 97.1°F | Resp 18 | Ht 70.5 in | Wt 171.5 lb

## 2024-03-03 DIAGNOSIS — I739 Peripheral vascular disease, unspecified: Secondary | ICD-10-CM

## 2024-03-03 DIAGNOSIS — M545 Low back pain, unspecified: Secondary | ICD-10-CM

## 2024-03-03 LAB — VAS US ABI WITH/WO TBI
Left ABI: 0.9
Right ABI: 0.95

## 2024-05-12 ENCOUNTER — Inpatient Hospital Stay (HOSPITAL_COMMUNITY)
Admission: EM | Admit: 2024-05-12 | Discharge: 2024-05-17 | DRG: 871 | Disposition: A | Discharge status: Home / Self Care

## 2024-05-12 ENCOUNTER — Emergency Department (HOSPITAL_COMMUNITY)

## 2024-05-12 ENCOUNTER — Encounter (HOSPITAL_COMMUNITY): Payer: Self-pay

## 2024-05-12 ENCOUNTER — Ambulatory Visit: Payer: Self-pay | Admitting: Student

## 2024-05-12 DIAGNOSIS — J449 Chronic obstructive pulmonary disease, unspecified: Secondary | ICD-10-CM | POA: Diagnosis present

## 2024-05-12 DIAGNOSIS — R509 Fever, unspecified: Secondary | ICD-10-CM

## 2024-05-12 DIAGNOSIS — E7800 Pure hypercholesterolemia, unspecified: Secondary | ICD-10-CM | POA: Diagnosis present

## 2024-05-12 DIAGNOSIS — Z7982 Long term (current) use of aspirin: Secondary | ICD-10-CM

## 2024-05-12 DIAGNOSIS — Z951 Presence of aortocoronary bypass graft: Secondary | ICD-10-CM

## 2024-05-12 DIAGNOSIS — I739 Peripheral vascular disease, unspecified: Secondary | ICD-10-CM | POA: Diagnosis present

## 2024-05-12 DIAGNOSIS — Z1152 Encounter for screening for COVID-19: Secondary | ICD-10-CM | POA: Diagnosis not present

## 2024-05-12 DIAGNOSIS — Z7902 Long term (current) use of antithrombotics/antiplatelets: Secondary | ICD-10-CM

## 2024-05-12 DIAGNOSIS — G4700 Insomnia, unspecified: Secondary | ICD-10-CM | POA: Diagnosis present

## 2024-05-12 DIAGNOSIS — I5030 Unspecified diastolic (congestive) heart failure: Secondary | ICD-10-CM

## 2024-05-12 DIAGNOSIS — T361X5A Adverse effect of cephalosporins and other beta-lactam antibiotics, initial encounter: Secondary | ICD-10-CM | POA: Diagnosis present

## 2024-05-12 DIAGNOSIS — I2489 Other forms of acute ischemic heart disease: Secondary | ICD-10-CM | POA: Diagnosis present

## 2024-05-12 DIAGNOSIS — R6520 Severe sepsis without septic shock: Secondary | ICD-10-CM | POA: Diagnosis present

## 2024-05-12 DIAGNOSIS — Z955 Presence of coronary angioplasty implant and graft: Secondary | ICD-10-CM

## 2024-05-12 DIAGNOSIS — Z792 Long term (current) use of antibiotics: Secondary | ICD-10-CM | POA: Diagnosis not present

## 2024-05-12 DIAGNOSIS — I5022 Chronic systolic (congestive) heart failure: Secondary | ICD-10-CM | POA: Diagnosis present

## 2024-05-12 DIAGNOSIS — J9601 Acute respiratory failure with hypoxia: Secondary | ICD-10-CM | POA: Diagnosis present

## 2024-05-12 DIAGNOSIS — E876 Hypokalemia: Secondary | ICD-10-CM | POA: Diagnosis present

## 2024-05-12 DIAGNOSIS — N179 Acute kidney failure, unspecified: Secondary | ICD-10-CM | POA: Diagnosis present

## 2024-05-12 DIAGNOSIS — A4151 Sepsis due to Escherichia coli [E. coli]: Secondary | ICD-10-CM | POA: Diagnosis present

## 2024-05-12 DIAGNOSIS — D72829 Elevated white blood cell count, unspecified: Secondary | ICD-10-CM

## 2024-05-12 DIAGNOSIS — E860 Dehydration: Secondary | ICD-10-CM | POA: Diagnosis present

## 2024-05-12 DIAGNOSIS — G8929 Other chronic pain: Secondary | ICD-10-CM

## 2024-05-12 DIAGNOSIS — J9811 Atelectasis: Secondary | ICD-10-CM | POA: Diagnosis present

## 2024-05-12 DIAGNOSIS — A4150 Gram-negative sepsis, unspecified: Secondary | ICD-10-CM | POA: Diagnosis not present

## 2024-05-12 DIAGNOSIS — R55 Syncope and collapse: Secondary | ICD-10-CM | POA: Diagnosis present

## 2024-05-12 DIAGNOSIS — Z9981 Dependence on supplemental oxygen: Secondary | ICD-10-CM | POA: Diagnosis not present

## 2024-05-12 DIAGNOSIS — Z79899 Other long term (current) drug therapy: Secondary | ICD-10-CM

## 2024-05-12 DIAGNOSIS — I340 Nonrheumatic mitral (valve) insufficiency: Secondary | ICD-10-CM | POA: Diagnosis not present

## 2024-05-12 DIAGNOSIS — I2510 Atherosclerotic heart disease of native coronary artery without angina pectoris: Secondary | ICD-10-CM

## 2024-05-12 DIAGNOSIS — A419 Sepsis, unspecified organism: Principal | ICD-10-CM

## 2024-05-12 DIAGNOSIS — R21 Rash and other nonspecific skin eruption: Secondary | ICD-10-CM | POA: Diagnosis present

## 2024-05-12 DIAGNOSIS — E872 Acidosis, unspecified: Secondary | ICD-10-CM | POA: Diagnosis present

## 2024-05-12 DIAGNOSIS — I110 Hypertensive heart disease with heart failure: Secondary | ICD-10-CM | POA: Diagnosis present

## 2024-05-12 DIAGNOSIS — T368X5A Adverse effect of other systemic antibiotics, initial encounter: Secondary | ICD-10-CM | POA: Diagnosis present

## 2024-05-12 DIAGNOSIS — I5020 Unspecified systolic (congestive) heart failure: Secondary | ICD-10-CM | POA: Diagnosis not present

## 2024-05-12 DIAGNOSIS — E861 Hypovolemia: Secondary | ICD-10-CM | POA: Diagnosis present

## 2024-05-12 LAB — CBC WITH DIFFERENTIAL/PLATELET
Abs Immature Granulocytes: 0.14 K/uL — ABNORMAL HIGH (ref 0.00–0.07)
Basophils Absolute: 0 K/uL (ref 0.0–0.1)
Basophils Relative: 0 %
Eosinophils Absolute: 0 K/uL (ref 0.0–0.5)
Eosinophils Relative: 0 %
HCT: 38.3 % — ABNORMAL LOW (ref 39.0–52.0)
Hemoglobin: 12.1 g/dL — ABNORMAL LOW (ref 13.0–17.0)
Immature Granulocytes: 1 %
Lymphocytes Relative: 15 %
Lymphs Abs: 2.6 K/uL (ref 0.7–4.0)
MCH: 26.5 pg (ref 26.0–34.0)
MCHC: 31.6 g/dL (ref 30.0–36.0)
MCV: 84 fL (ref 80.0–100.0)
Monocytes Absolute: 1.7 K/uL — ABNORMAL HIGH (ref 0.1–1.0)
Monocytes Relative: 10 %
Neutro Abs: 12.9 K/uL — ABNORMAL HIGH (ref 1.7–7.7)
Neutrophils Relative %: 74 %
Platelets: 318 K/uL (ref 150–400)
RBC: 4.56 MIL/uL (ref 4.22–5.81)
RDW: 14.9 % (ref 11.5–15.5)
WBC: 17.3 K/uL — ABNORMAL HIGH (ref 4.0–10.5)
nRBC: 0 % (ref 0.0–0.2)

## 2024-05-12 LAB — RESPIRATORY PANEL BY PCR

## 2024-05-12 LAB — MAGNESIUM: Magnesium: 1.1 mg/dL — ABNORMAL LOW (ref 1.7–2.4)

## 2024-05-12 LAB — URINALYSIS, ROUTINE W REFLEX MICROSCOPIC
Bilirubin Urine: NEGATIVE
Glucose, UA: NEGATIVE mg/dL
Ketones, ur: NEGATIVE mg/dL
Nitrite: NEGATIVE
Protein, ur: NEGATIVE mg/dL
Specific Gravity, Urine: 1.005 (ref 1.005–1.030)
WBC, UA: >50 WBC/hpf (ref 0–5)
pH: 6 (ref 5.0–8.0)

## 2024-05-12 LAB — I-STAT CHEM 8, ED
BUN: 10 mg/dL (ref 8–23)
Calcium, Ion: 0.89 mmol/L — CL (ref 1.15–1.40)
Chloride: 96 mmol/L — ABNORMAL LOW (ref 98–111)
Creatinine, Ser: 1.2 mg/dL (ref 0.61–1.24)
Glucose, Bld: 127 mg/dL — ABNORMAL HIGH (ref 70–99)
HCT: 42 % (ref 39.0–52.0)
Hemoglobin: 14.3 g/dL (ref 13.0–17.0)
Potassium: 3.4 mmol/L — ABNORMAL LOW (ref 3.5–5.1)
Sodium: 138 mmol/L (ref 135–145)
TCO2: 30 mmol/L (ref 22–32)

## 2024-05-12 LAB — COMPREHENSIVE METABOLIC PANEL WITH GFR
ALT: 22 U/L (ref 0–44)
AST: 30 U/L (ref 15–41)
Albumin: 3.2 g/dL — ABNORMAL LOW (ref 3.5–5.0)
Alkaline Phosphatase: 57 U/L (ref 38–126)
Anion gap: 13 (ref 5–15)
BUN: 8 mg/dL (ref 8–23)
CO2: 26 mmol/L (ref 22–32)
Calcium: 8.6 mg/dL — ABNORMAL LOW (ref 8.9–10.3)
Chloride: 98 mmol/L (ref 98–111)
Creatinine, Ser: 1.36 mg/dL — ABNORMAL HIGH (ref 0.61–1.24)
GFR, Estimated: 57 mL/min — ABNORMAL LOW (ref >60)
Glucose, Bld: 131 mg/dL — ABNORMAL HIGH (ref 70–99)
Potassium: 2.4 mmol/L — CL (ref 3.5–5.1)
Sodium: 137 mmol/L (ref 135–145)
Total Bilirubin: 2 mg/dL — ABNORMAL HIGH (ref 0.0–1.2)
Total Protein: 7.1 g/dL (ref 6.5–8.1)

## 2024-05-12 LAB — CK: Total CK: 296 U/L (ref 49–397)

## 2024-05-12 LAB — TROPONIN I (HIGH SENSITIVITY)
Troponin I (High Sensitivity): 40 ng/L — ABNORMAL HIGH (ref <18)
Troponin I (High Sensitivity): 128 ng/L (ref <18)
Troponin I (High Sensitivity): 221 ng/L (ref <18)
Troponin I (High Sensitivity): 175 ng/L (ref <18)

## 2024-05-12 LAB — RESP PANEL BY RT-PCR (RSV, FLU A&B, COVID)  RVPGX2
Influenza A by PCR: NEGATIVE
Influenza B by PCR: NEGATIVE
Resp Syncytial Virus by PCR: NEGATIVE
SARS Coronavirus 2 by RT PCR: NEGATIVE

## 2024-05-12 LAB — LACTIC ACID, PLASMA
Lactic Acid, Venous: 3.6 mmol/L (ref 0.5–1.9)
Lactic Acid, Venous: 2.9 mmol/L (ref 0.5–1.9)

## 2024-05-12 LAB — CBC
HCT: 38.7 % — ABNORMAL LOW (ref 39.0–52.0)
Hemoglobin: 11.8 g/dL — ABNORMAL LOW (ref 13.0–17.0)
MCH: 26.8 pg (ref 26.0–34.0)
MCHC: 30.5 g/dL (ref 30.0–36.0)
MCV: 87.8 fL (ref 80.0–100.0)
Platelets: 306 K/uL (ref 150–400)
RBC: 4.41 MIL/uL (ref 4.22–5.81)
RDW: 15 % (ref 11.5–15.5)
WBC: 15.8 K/uL — ABNORMAL HIGH (ref 4.0–10.5)
nRBC: 0 % (ref 0.0–0.2)

## 2024-05-12 LAB — HIV ANTIBODY (ROUTINE TESTING W REFLEX): HIV Screen 4th Generation wRfx: NONREACTIVE

## 2024-05-12 LAB — TSH: TSH: 0.389 u[IU]/mL (ref 0.350–4.500)

## 2024-05-12 MED ORDER — METRONIDAZOLE 500 MG/100ML IV SOLN: 500.0000 mg | Freq: Two times a day (BID) | INTRAVENOUS | Status: DC

## 2024-05-12 MED ORDER — ACETAMINOPHEN 325 MG PO TABS
650.0000 mg | ORAL_TABLET | Freq: Four times a day (QID) | ORAL | Status: DC | PRN
  Administered 2024-05-12 – 2024-05-16 (×9): 650 mg via ORAL
  Filled 2024-05-12 (×9): qty 2

## 2024-05-12 MED ORDER — POTASSIUM CHLORIDE 10 MEQ/100ML IV SOLN
10.0000 meq | INTRAVENOUS | Status: AC
  Administered 2024-05-12 (×4): 10 meq via INTRAVENOUS
  Filled 2024-05-12 (×4): qty 100

## 2024-05-12 MED ORDER — CLOPIDOGREL BISULFATE 75 MG PO TABS
75.0000 mg | ORAL_TABLET | Freq: Every day | ORAL | Status: DC
  Administered 2024-05-12 – 2024-05-17 (×6): 75 mg via ORAL
  Filled 2024-05-12 (×6): qty 1

## 2024-05-12 MED ORDER — LINEZOLID 600 MG/300ML IV SOLN: 600.0000 mg | Freq: Two times a day (BID) | INTRAVENOUS | Status: DC

## 2024-05-12 MED ORDER — VANCOMYCIN HCL IN DEXTROSE 1-5 GM/200ML-% IV SOLN
1000.0000 mg | Freq: Once | INTRAVENOUS | Status: AC
  Administered 2024-05-12: 1000 mg via INTRAVENOUS
  Filled 2024-05-12: qty 200

## 2024-05-12 MED ORDER — LACTATED RINGERS IV SOLN: INTRAVENOUS | Status: DC

## 2024-05-12 MED ORDER — ASPIRIN 81 MG PO CHEW
81.0000 mg | CHEWABLE_TABLET | Freq: Every day | ORAL | Status: DC
  Administered 2024-05-12 – 2024-05-17 (×6): 81 mg via ORAL
  Filled 2024-05-12 (×6): qty 1

## 2024-05-12 MED ORDER — DOXYCYCLINE HYCLATE 100 MG PO TABS
100.0000 mg | ORAL_TABLET | Freq: Two times a day (BID) | ORAL | Status: DC
  Administered 2024-05-12: 100 mg via ORAL
  Filled 2024-05-12: qty 1

## 2024-05-12 MED ORDER — METRONIDAZOLE 500 MG/100ML IV SOLN
500.0000 mg | Freq: Once | INTRAVENOUS | Status: AC
  Administered 2024-05-12: 500 mg via INTRAVENOUS
  Filled 2024-05-12: qty 100

## 2024-05-12 MED ORDER — LACTATED RINGERS IV BOLUS (SEPSIS)
1000.0000 mL | Freq: Once | INTRAVENOUS | Status: AC
  Administered 2024-05-12: 1000 mL via INTRAVENOUS

## 2024-05-12 MED ORDER — SODIUM CHLORIDE 0.9 % IV SOLN: INTRAVENOUS | Status: DC

## 2024-05-12 MED ORDER — ENOXAPARIN SODIUM 40 MG/0.4ML IJ SOSY
40.0000 mg | PREFILLED_SYRINGE | INTRAMUSCULAR | Status: DC
  Administered 2024-05-12 – 2024-05-15 (×4): 40 mg via SUBCUTANEOUS
  Filled 2024-05-12 (×4): qty 0.4

## 2024-05-12 MED ORDER — POLYETHYLENE GLYCOL 3350 17 G PO PACK
17.0000 g | PACK | Freq: Every day | ORAL | Status: DC | PRN
  Filled 2024-05-12: qty 1

## 2024-05-12 MED ORDER — DIPHENHYDRAMINE HCL 50 MG/ML IJ SOLN
25.0000 mg | Freq: Once | INTRAMUSCULAR | Status: AC
  Administered 2024-05-12: 25 mg via INTRAVENOUS
  Filled 2024-05-12: qty 1

## 2024-05-12 MED ORDER — POTASSIUM CHLORIDE CRYS ER 20 MEQ PO TBCR
40.0000 meq | EXTENDED_RELEASE_TABLET | Freq: Two times a day (BID) | ORAL | Status: AC
  Administered 2024-05-12 – 2024-05-13 (×2): 40 meq via ORAL
  Filled 2024-05-12 (×2): qty 2

## 2024-05-12 MED ORDER — ACETAMINOPHEN 650 MG RE SUPP: 650.0000 mg | Freq: Four times a day (QID) | RECTAL | Status: DC | PRN

## 2024-05-12 MED ORDER — GABAPENTIN 100 MG PO CAPS
100.0000 mg | ORAL_CAPSULE | Freq: Once | ORAL | Status: AC
  Administered 2024-05-12: 100 mg via ORAL
  Filled 2024-05-12: qty 1

## 2024-05-12 MED ORDER — SODIUM CHLORIDE 0.9 % IV SOLN
2.0000 g | INTRAVENOUS | Status: DC
  Administered 2024-05-13 – 2024-05-15 (×3): 2 g via INTRAVENOUS
  Filled 2024-05-12 (×3): qty 20

## 2024-05-12 MED ORDER — SODIUM CHLORIDE 0.9 % IV SOLN
2.0000 g | Freq: Once | INTRAVENOUS | Status: AC
  Administered 2024-05-12: 2 g via INTRAVENOUS
  Filled 2024-05-12: qty 12.5

## 2024-05-12 NOTE — ED Notes (Signed)
 Hospitalist notified of critical lab value  Troponin 128

## 2024-05-12 NOTE — ED Triage Notes (Addendum)
 Pt BIB [facility] from after a witnessed syncopal episode. Friends reported pt LOC for a few seconds and quickly came too. Denies hitting head or feeling dizzy prior, reports it came out of no where, pt takes plavix . Friends reported recent heart procedure. Initial bp 80/60. C/o pain in left leg  BP 142/100 CBG 163

## 2024-05-12 NOTE — ED Provider Notes (Signed)
 [place] EMERGENCY DEPARTMENT AT [facility] Provider Note   CSN: [id number] Arrival date & time: 05/12/24  1317     Patient presents with: Loss of Consciousness   Donald Leblanc is a 68 y.o. male.   HPI 68 year old man status post CABG April 2024, presents today with report of syncope and hypotension.  Patient reports that he was in his usual state of health today.  He was standing when he had a syncopal episode.  He denies striking his head.  He states that he had friends help him up.  He denies any headache, head injury, chest pain, dyspnea, fever, nausea, vomiting, diarrhea.  He reports taking medications as prescribed and is not on any blood thinners.  EMS reports first blood pressure is 80    Prior to Admission medications   Medication Sig Start Date End Date Taking? Authorizing Provider  albuterol  (PROVENTIL  HFA;VENTOLIN  HFA) 108 (90 Base) MCG/ACT inhaler Inhale 1-2 puffs into the lungs every 6 (six) hours as needed for wheezing or shortness of breath. 12/15/17   Pisciotta, Nat, PA-C  alprostadil (EDEX) 40 MCG injection 40 mcg daily as needed for erectile dysfunction. use no more than 3 times per week    [provider]  ASPIRIN  81 PO Take 1 tablet by mouth daily.    [provider]  atorvastatin  (LIPITOR) 40 MG tablet Take 1 tablet (40 mg total) by mouth daily at 6 PM. Patient taking differently: Take 40 mg by mouth daily. 01/01/18   Pearlean Manus, MD  benzonatate  (TESSALON ) 100 MG capsule Take 1 capsule (100 mg total) by mouth every 8 (eight) hours. Patient taking differently: Take 100 mg by mouth daily. 11/30/15   Kirichenko, Tatyana, PA-C  clopidogrel  (PLAVIX ) 75 MG tablet Take 75 mg by mouth daily.    [provider]  furosemide  (LASIX ) 40 MG tablet Take 1 tablet (40 mg total) by mouth as needed (as needed for weight gain 3 lbs overnight or 5 lbs from baseline). 04/28/23 04/27/24  Vernon Ranks, MD  HYDROcodone -acetaminophen   (NORCO/VICODIN) 5-325 MG tablet Take 1-2 tablets by mouth every 6 hours as needed for cough. Patient taking differently: Take 1 tablet by mouth 3 (three) times daily. 12/15/17   Pisciotta, Nat, PA-C  isosorbide  mononitrate (IMDUR ) 30 MG 24 hr tablet Take 30 mg by mouth daily.    [provider]  spironolactone  (ALDACTONE ) 25 MG tablet Take 1 tablet (25 mg total) by mouth daily. 04/29/23 03/03/24  Vernon Ranks, MD    Allergies: Patient has no known allergies.    Review of Systems  Updated Vital Signs BP (!) 131/104   Pulse 100   Temp (!) 102.7 F (39.3 C) (Oral)   Resp (!) 22   Ht 1.778 m (5' 10)   Wt 74.8 kg   SpO2 97%   BMI 23.68 kg/m   Physical Exam  (all labs ordered are listed, but only abnormal results are displayed) Labs Reviewed  CBC - Abnormal; Notable for the following components:      Result Value   WBC 15.8 (*)    Hemoglobin 11.8 (*)    HCT 38.7 (*)    All other components within normal limits  LACTIC ACID, PLASMA - Abnormal; Notable for the following components:   Lactic Acid, Venous 3.6 (*)    All other components within normal limits  I-STAT CHEM 8, ED - Abnormal; Notable for the following components:   Potassium 3.4 (*)    Chloride 96 (*)  Glucose, Bld 127 (*)    Calcium , Ion 0.89 (*)    All other components within normal limits  TROPONIN I (HIGH SENSITIVITY) - Abnormal; Notable for the following components:   Troponin I (High Sensitivity) 40 (*)    All other components within normal limits  CULTURE, BLOOD (ROUTINE X 2)  CULTURE, BLOOD (ROUTINE X 2)  LACTIC ACID, PLASMA  COMPREHENSIVE METABOLIC PANEL WITH GFR  URINALYSIS, ROUTINE W REFLEX MICROSCOPIC  I-STAT CHEM 8, ED  TROPONIN I (HIGH SENSITIVITY)    EKG: EKG Interpretation Date/Time:  Friday May 12 2024 13:42:23 EDT Ventricular Rate:  98 PR Interval:  209 QRS Duration:  93 QT Interval:  345 QTC Calculation: 441 R Axis:   65  Text Interpretation: Sinus tachycardia  Multiform ventricular premature complexes Borderline prolonged PR interval Probable left atrial enlargement Abnormal Q wave in V1 Repol abnrm suggests ischemia, diffuse leads Confirmed by Levander Houston [phone]) on 05/12/2024 3:42:02 PM  Radiology: [facility] Chest Port 1 View Result Date: 05/12/2024 CLINICAL DATA:  Weakness. EXAM: PORTABLE CHEST 1 VIEW COMPARISON:  April 24, 2023. FINDINGS: Stable cardiomediastinal silhouette. Sternotomy wires are noted. Left lung is clear. Stable probable scarring seen in right upper lobe. Bony thorax is unremarkable. IMPRESSION: No active disease. Electronically Signed   By: Lynwood Landy Raddle M.D.   On: 05/12/2024 15:20     .Critical Care  Performed by: Levander Houston, MD Authorized by: Levander Houston, MD   Critical care provider statement:    Critical care time (minutes):  60   Critical care was necessary to treat or prevent imminent or life-threatening deterioration of the following conditions:  Sepsis   Critical care was time spent personally by me on the following activities:  Development of treatment plan with patient or surrogate, discussions with consultants, evaluation of patient's response to treatment, examination of patient, ordering and review of laboratory studies, ordering and review of radiographic studies, ordering and performing treatments and interventions, pulse oximetry, re-evaluation of patient's condition and review of old charts    Medications Ordered in the ED  lactated ringers  infusion (has no administration in time range)  metroNIDAZOLE  (FLAGYL ) IVPB 500 mg (500 mg Intravenous New Bag/Given 05/12/24 1608)  ceFEPIme  (MAXIPIME ) 2 g in sodium chloride  0.9 % 100 mL IVPB (0 g Intravenous Stopped 05/12/24 1606)  vancomycin  (VANCOCIN ) IVPB 1000 mg/200 mL premix (1,000 mg Intravenous New Bag/Given 05/12/24 1510)  lactated ringers  bolus 1,000 mL (1,000 mLs Intravenous New Bag/Given 05/12/24 1509)    Clinical Course as of 05/12/24 1626  Fri May 12, 2024   1409 Prehospital EKG reviewed with diffuse ST depression in 2 3 aVF and V2 through V6 [DR]  1409 CBC reviewed interpreted significant for leukocytosis white count 15,800 mild anemia hemoglobin 11.8 [DR]    Clinical Course User Index [DR] Levander Houston, MD                                 Medical Decision Making Amount and/or Complexity of Data Reviewed Labs: ordered. Radiology: ordered.  Risk Prescription drug management.  68 year old man who presents today with report of near syncope and hypotension.  Patient received IV fluids prehospital and is now normotensive.  He states that he was standing and had this lightheadedness that caused him to fall the ground.  He denies any chest pain, dyspnea, cough, fever, nausea, Dahal vomiting, diarrhea, UTI symptoms.  He reports having any similar symptoms in the past.  He denies any prior history of syncope.  He does have a known history of coronary artery disease and has had a CABG in the past. Prehospital EKG showed diffuse nonspecific ST changes that were consistent with global ischemia likely secondary to his hypotension Patient is not having any active chest pain. Due to hypotension patient had labs ordered including CBC, i-STAT, troponin, and lactic acid. CBC is significant for leukocytosis otherwise is within normal limits Sodium normal, mild hypokalemia, ionized calcium  is decreased Lactic acid is found to be elevated at 3.6 Mild troponin elevation at 40 Care discussed with IM teaching service who will see for admission     Final diagnoses:  Sepsis without acute organ dysfunction, due to unspecified organism [facility])    ED Discharge Orders     None          Levander Houston, MD 05/12/24 1626

## 2024-05-12 NOTE — ED Notes (Signed)
 CCMD called.

## 2024-05-12 NOTE — Hospital Course (Addendum)
 Syncope HFrEF (echo 04/2023 25-30%) Heart arrhythmia Coronary artery disease Patient presented after fainting and hypovolemic. States he was in normal health prior to fainting episode.  Review of systems largely negative aside from a recent reported weight loss.  Patient denies chest palpitations, chest pain, vasovagal like prodrome, or signs of vertigo.  Heart rhythm irregular on exam.  First troponin in ED 40, repeat troponin 128.  If his troponins continue to trend up, call cardiology and start a heparin  drip. Will continue to trend until peak.  Likely his syncopal episode is cardiogenic, consider cardiac monitor and cardiology consultation. Magnesium  sulfate supplemented, potassium 2.4 on admission-> 3.4 with supplementation-> 2.4 this morning. -Aspirin  81 mg daily, clopidogrel  75 mg daily -On telemetry  Echo 8/23 consistent with left ventricular ejection fraction 35%, inferior and posterior lateral wall hypokinesis, mild MV regurgitation -TSH .389  - Orthostatics in a.m. -Consulted cards for the [place] wall motion abnormalities     Severe Sepsis 2/2 GNR WBC 15.8 on admission, temperature up to 102.7. patient had a drug reaction after vancomycin  and cefepime .  Patient denies sick contacts, cough, fever, bloody stools, GI upset, difficulty breathing.  Patient did have MSK injection in left lower back 2 weeks ago.  This is possibly an infection, unsure of where the source of infection is coming from.  Will remain on broad-spectrum antibiotics.  Patient was last sexually active 2 weeks ago and did have symptoms of vaginal itchiness, although denied discharge.  Lactic acid in ED 3.6 and decreased to 2.9. Ordered a differential, could be an acute neoplastic process with recent weight loss. Given bacteria and leukocytes in urine, likely urinary source.  Patient did have injections for pain 2 weeks ago, concomitant steroids may have compromised immune status and lead to systemic infection.  -Doxy,  metronidazole , ceftriaxone  narrowed to only ceftriaxone  -Acetaminophen  650 mg every 6 hours as needed -UA: moderate Hgb, large leukocytes, rare bacteria - Blood cultures growing gram-negative rods, may be able to narrow to only ceftriaxone , if ESBL producer then switch to Carbapenems. BCID positive for E. coli -CK 296 -RVP, COVID, flu negative - RPR AND HIV nonreactive - GC/chlamydia   AKI Lactic acidosis Serum creatinine 1.36 on admission, following 8/7/2 for serum creatinine was 1.41.  Possibly due to dehydration.  Potassium 2.4 on admission, repeat potassium 3.4.  Was given potassium chloride  supplements.  Patient producing urine in ED. Lactic acid in ED 3.6 and decreased to 2.9. Possibly due to low volume in the setting of sepsis.  -CMP in a.m.   Chronic pain Peripheral arterial disease History of femoral artery stent and repair in 2014 and 2015.  ABIs in 6/25 were normal. On initial exam, straight leg test was negative bilaterally.  Patient had equal strength on both sides.  Likely chronic degenerative changes and sciatic like syndrome.  Given history of cardiovascular disease, there could be some component of peripheral arterial disease although patient denies worsening of symptoms on ambulation.  Will try gabapentin  tonight and follow-up with patient in the morning. - Gabapentin  100 mg 1 dose tonight

## 2024-05-12 NOTE — Sepsis Progress Note (Signed)
 eLink is following this Code Sepsis.

## 2024-05-12 NOTE — Progress Notes (Signed)
 HI Dr., CCMD just called that the patient had 7 beats of VT around 1945. No symptom noted. Dr. Tobie notified without any new order. Will continue to monitor.

## 2024-05-12 NOTE — H&P (Addendum)
 Date: 05/12/2024               Patient Name:  Donald Leblanc MRN: [id number]  DOB: 09/19/1956 Age / Sex: 68 y.o., male   PCP: [name], MD         Medical Service: Internal Medicine Teaching Service         Attending Physician: [name]      First Contact: [name], DO}    Second Contact: Dr. Libby Blanch, DO          Pager Information: First Contact Pager: [phone]   Second Contact Pager: [phone]   SUBJECTIVE   Chief Complaint: Fainted  History of Present Illness: Donald Leblanc is a 68 y.o. male with PMH of hypertension, cardiovascular disease, coronary artery disease status post CABG in 12/2022, chronic sciatic-type pain and heart failure.   Patient reports going outside to fix his uncle's tire, once he was finished he went to sit down and he thought he was going to sit down but fainted.  This happened around 1:30 PM, he was transported by ambulance to the ED.  His friend was there telling him to wake up.  Patient denies any chest pain at around the time of the event.  Patient denies feeling hot or sweaty around the event.  Patient denies dizziness both around the event and now.  Patient ate breakfast this morning and dinner last night, his meals have been normal.  Denies pain during eating, and after eating.  His appetite has been normal.  Patient denies cough, fevers, chills, stuffy nose, sick contacts.  Denies palpitations, chest pain, or arm pain.  Patient does notice an unintentional weight loss over the past month.  Patient has chronic low back pain that runs down his left leg.  Patient says he is always in pain from this.  No new back or leg pain.  His leg pain is not worse with ambulation, it is all the time even when he is at rest.  He has a difficult time getting comfortable.  About 2 weeks ago he went to a clinic and received a steroid injection in his lower back for the pain, denies any tenderness or warmth coming from the area of injection.  Patient has had  normal bowel movements, denies dark stools, denies blood in stools.  Last bowel movement was yesterday.  Patient does not have pain with urination but notices some itchiness with urination.  Of note, when we initially walked into the room patient was itching on his right inner and upper thigh that was also on his right lower back and abdomen.  The nurse explained that this rash started after vancomycin  and cefepime  was given.  They stopped the antibiotic and the rash had already gone away by the time we were there and the itchiness had stopped by the time we left the room.  ED Course: Labs significant for  CBC - Abnormal; Notable for the following components:      Result Value     WBC 15.8 (*)      Hemoglobin 11.8 (*)      HCT 38.7 (*)      All other components within normal limits  LACTIC ACID, PLASMA - Abnormal; Notable for the following components:    Lactic Acid, Venous 3.6 (*)      All other components within normal limits  I-STAT CHEM 8, ED - Abnormal; Notable for the following components:    Potassium 3.4 (*)  Chloride 96 (*)      Glucose, Bld 127 (*)      Calcium , Ion 0.89 (*)      All other components within normal limits  TROPONIN I (HIGH SENSITIVITY) - Abnormal; Notable for the following components:    Troponin I (High Sensitivity) 40 (*)      All other components within normal limits  CULTURE, BLOOD (ROUTINE X 2)  CULTURE, BLOOD (ROUTINE X 2)  LACTIC ACID, PLASMA 2.9  COMPREHENSIVE METABOLIC PANEL WITH GFR potassium 2.4  URINALYSIS, ROUTINE W REFLEX MICROSCOPIC 100 protein, rare bacteria  I-STAT CHEM 8, ED 0.89 ionized calcium   TROPONIN I (HIGH SENSITIVITY) 128   Imaging chest x-ray no active disease Received LR, metronidazole , cefepime , vancomycin , LR Consulted IM TS  Meds:  Patient reported:  Medications: Amlodipine  10 mg daily  Aspirin  81 mg daily Benzonatate  100 mg every 8 hours. Plavix  75 mg daily Lasix  40 mg as needed Norco  Imdur  60 mg  daily Spironolactone  25 mg daily  No outpatient medications have been marked as taking for the 05/12/24 encounter [facility] Encounter).    Past Medical History Hypertension, hyperlipidemia, heart failure, chronic low back pain  Past Surgical History Past Surgical History:  Procedure Laterality Date   CORONARY ANGIOPLASTY WITH STENT PLACEMENT     ESOPHAGOGASTRODUODENOSCOPY (EGD) WITH PROPOFOL  N/A 12/30/2017   Procedure: ESOPHAGOGASTRODUODENOSCOPY (EGD) WITH PROPOFOL ;  Surgeon: Charlanne Groom, MD;  Location: [facility] ENDOSCOPY;  Service: Endoscopy;  Laterality: N/A;   HAND SURGERY     RIGHT/LEFT HEART CATH AND CORONARY/GRAFT ANGIOGRAPHY N/A 04/26/2023   Procedure: RIGHT/LEFT HEART CATH AND CORONARY/GRAFT ANGIOGRAPHY;  Surgeon: Verlin Lonni BIRCH, MD;  Location: [facility] INVASIVE CV LAB;  Service: Cardiovascular;  Laterality: N/A;     Social:  Lives With: Roommate Occupation: Retired Hotel manager Level of Function: Independent PCP:  [name], MD  Substances: -Tobacco: Not in 20 years -Alcohol: None -Recreational Drug: Not in about 15 years  Family History:  Family History  Problem Relation Age of Onset   Cancer Mother        Unknown   Colon cancer Neg Hx    Pancreatic cancer Neg Hx      Allergies: Allergies as of 05/12/2024   (No Known Allergies)    Review of Systems: A complete ROS was negative except as per HPI.   OBJECTIVE:   Physical Exam: Blood pressure (!) 107/57, pulse 86, temperature 98.1 F (36.7 C), temperature source Oral, resp. rate 19, height 5' 10 (1.778 m), weight 74.8 kg, SpO2 92%.  Constitutional: well-appearing male sitting in ED bed, in no acute distress, repositioning often HENT: normocephalic atraumatic, mucous membranes moist, no anterior or posterior or submandibular lymphadenopathy Eyes: conjunctiva non-erythematous, nonicteric Neck: supple Cardiovascular: Irregular rhythm Pulmonary/Chest: normal work of breathing on room air, lungs clear to  auscultation bilaterally Abdominal: soft, non-tender, non-distended MSK: normal bulk and tone Neurological: alert & oriented x 3, 5/5 strength in bilateral upper and lower extremities, normal gait Skin: Warm to touch Psych: Normal mood  Labs: CBC    Component Value Date/Time   WBC 15.8 (H) 05/12/2024 1340   RBC 4.41 05/12/2024 1340   HGB 14.3 05/12/2024 1535   HCT 42.0 05/12/2024 1535   PLT 306 05/12/2024 1340   MCV 87.8 05/12/2024 1340   MCH 26.8 05/12/2024 1340   MCHC 30.5 05/12/2024 1340   RDW 15.0 05/12/2024 1340   LYMPHSABS 3.8 01/27/2020 1512   MONOABS 0.8 01/27/2020 1512   EOSABS 0.4 01/27/2020 1512   BASOSABS 0.0  01/27/2020 1512     CMP     Component Value Date/Time   NA 138 05/12/2024 1535   K 3.4 (L) 05/12/2024 1535   CL 96 (L) 05/12/2024 1535   CO2 26 05/12/2024 1505   GLUCOSE 127 (H) 05/12/2024 1535   BUN 10 05/12/2024 1535   CREATININE 1.20 05/12/2024 1535   CALCIUM  8.6 (L) 05/12/2024 1505   PROT 7.1 05/12/2024 1505   ALBUMIN 3.2 (L) 05/12/2024 1505   AST 30 05/12/2024 1505   ALT 22 05/12/2024 1505   ALKPHOS 57 05/12/2024 1505   BILITOT 2.0 (H) 05/12/2024 1505   GFRNONAA 57 (L) 05/12/2024 1505   GFRAA >60 01/27/2020 1512    Imaging:  DG Chest Port 1 View Result Date: 05/12/2024 CLINICAL DATA:  Weakness. EXAM: PORTABLE CHEST 1 VIEW COMPARISON:  April 24, 2023. FINDINGS: Stable cardiomediastinal silhouette. Sternotomy wires are noted. Left lung is clear. Stable probable scarring seen in right upper lobe. Bony thorax is unremarkable. IMPRESSION: No active disease. Electronically Signed   By: Lynwood Landy Raddle M.D.   On: 05/12/2024 15:20     EKG: personally reviewed my interpretation is sinus tachycardia with PVCs.   ASSESSMENT & PLAN:   Assessment & Plan by Problem: Principal Problem:   Syncope Active Problems:   CAD (coronary artery disease)   Fever, unspecified   Leukocytosis   Lactic acid acidosis   AKI (acute kidney injury)  (HCC)   Donald Leblanc is a 68 y.o. person living with a history of hypertension, hyperlipidemia, chronic pain, heart failure who presented with fainting spell and admitted for leukocytosis and elevated temperature on hospital day 0  Syncope HFrEF (echo 04/2023 25-30%) Heart arrhythmia Coronary artery disease Patient presented after fainting and hypovolemic. States he was in normal health prior to fainting episode.  Review of systems largely negative aside from a recent reported weight loss.  Patient denies chest palpitations, chest pain, vasovagal like prodrome, or signs of vertigo.  Heart rhythm irregular on exam.  First troponin in ED 40, repeat troponin 128.  If his troponins continue to trend up, call cardiology and start a heparin  drip. Will continue to trend until peak.  Likely his syncopal episode is cardiogenic, consider cardiac monitor and cardiology consultation. -Aspirin  81 mg daily, clopidogrel  75 mg daily -On telemetry - Echo pending -TSH pending - Orthostatics in a.m.  Leukocytosis and fever WBC 15.8 on admission, temperature up to 102.7. patient had a drug reaction after vancomycin  and cefepime .  Patient denies sick contacts, cough, fever, bloody stools, GI upset, difficulty breathing.  Patient did have MSK injection in left lower back 2 weeks ago.  This is possibly an infection, unsure of where the source of infection is coming from.  Will remain on broad-spectrum antibiotics.  Patient was last sexually active 2 weeks ago and did have symptoms of vaginal itchiness, although denied discharge.  Lactic acid in ED 3.6 and decreased to 2.9. Ordered a differential, could be an acute neoplastic process with recent weight loss.  -Doxy, metronidazole , ceftriaxone  -Acetaminophen  650 mg every 6 hours as needed - CBC with differential - Blood cultures pending -CK pending -RVP, COVID, flu - RPR, HIV - GC/chlamydia  AKI Lactic acidosis Serum creatinine 1.36 on admission, following  8/7/2 for serum creatinine was 1.41.  Possibly due to dehydration.  Potassium 2.4 on admission, repeat potassium 3.4.  Was given potassium chloride  supplements.  Patient producing urine in ED. Lactic acid in ED 3.6 and decreased to 2.9. -CMP in a.m.  Chronic sciatic-like pain On initial exam, straight leg test was negative bilaterally.  Patient had equal strength on both sides.  Likely chronic degenerative changes and sciatic like syndrome.  Given history of cardiovascular disease, there could be some component of peripheral arterial disease although patient denies worsening of symptoms on ambulation.  Will try gabapentin  tonight and follow-up with patient in the morning. - Gabapentin  100 mg 1 dose tonight  Best practice: Diet: Normal VTE: Enoxaparin  IVF: NS,100cc/hr Code: Full  Disposition planning: Prior to Admission Living Arrangement: Home, living roommate Anticipated Discharge Location: Home  Dispo: Admit patient to Inpatient with expected length of stay greater than 2 midnights.  Signed: Charmayne Holmes, DO Internal Medicine Resident  05/12/2024, 7:02 PM  On Call pager: [phone]

## 2024-05-12 NOTE — ED Notes (Signed)
 Patient called out for itching and rash. Patient was originally itching before the Flagyl  was given but no hives were present. Maxipime  and Vancomycin  was running at the same time prior to the flagyl . Maxipime  was finished and Vancomycin  had some left to be given but was stopped. Pharmacist and EDP notified. Benadryl  given for the itching.  Patient also educated that if he starts having difficulty breathing or facial swelling to notify RN immediately.

## 2024-05-13 ENCOUNTER — Inpatient Hospital Stay (HOSPITAL_COMMUNITY)

## 2024-05-13 ENCOUNTER — Other Ambulatory Visit: Payer: Self-pay

## 2024-05-13 DIAGNOSIS — R6520 Severe sepsis without septic shock: Secondary | ICD-10-CM

## 2024-05-13 DIAGNOSIS — I340 Nonrheumatic mitral (valve) insufficiency: Secondary | ICD-10-CM | POA: Diagnosis not present

## 2024-05-13 DIAGNOSIS — I5020 Unspecified systolic (congestive) heart failure: Secondary | ICD-10-CM

## 2024-05-13 DIAGNOSIS — A4150 Gram-negative sepsis, unspecified: Secondary | ICD-10-CM

## 2024-05-13 LAB — CBC WITH DIFFERENTIAL/PLATELET
Abs Immature Granulocytes: 0.06 K/uL (ref 0.00–0.07)
Basophils Absolute: 0 K/uL (ref 0.0–0.1)
Basophils Relative: 0 %
Eosinophils Absolute: 0 K/uL (ref 0.0–0.5)
Eosinophils Relative: 0 %
HCT: 35.3 % — ABNORMAL LOW (ref 39.0–52.0)
Hemoglobin: 11.1 g/dL — ABNORMAL LOW (ref 13.0–17.0)
Immature Granulocytes: 0 %
Lymphocytes Relative: 21 %
Lymphs Abs: 3.2 K/uL (ref 0.7–4.0)
MCH: 26.4 pg (ref 26.0–34.0)
MCHC: 31.4 g/dL (ref 30.0–36.0)
MCV: 84 fL (ref 80.0–100.0)
Monocytes Absolute: 1.5 K/uL — ABNORMAL HIGH (ref 0.1–1.0)
Monocytes Relative: 10 %
Neutro Abs: 10.2 K/uL — ABNORMAL HIGH (ref 1.7–7.7)
Neutrophils Relative %: 69 %
Platelets: 261 K/uL (ref 150–400)
RBC: 4.2 MIL/uL — ABNORMAL LOW (ref 4.22–5.81)
RDW: 14.8 % (ref 11.5–15.5)
WBC: 15 K/uL — ABNORMAL HIGH (ref 4.0–10.5)
nRBC: 0 % (ref 0.0–0.2)

## 2024-05-13 LAB — BLOOD CULTURE ID PANEL (REFLEXED) - BCID2

## 2024-05-13 LAB — COMPREHENSIVE METABOLIC PANEL WITH GFR
ALT: 19 U/L (ref 0–44)
AST: 22 U/L (ref 15–41)
Albumin: 2.6 g/dL — ABNORMAL LOW (ref 3.5–5.0)
Alkaline Phosphatase: 49 U/L (ref 38–126)
Anion gap: 9 (ref 5–15)
BUN: 7 mg/dL — ABNORMAL LOW (ref 8–23)
CO2: 27 mmol/L (ref 22–32)
Calcium: 7.9 mg/dL — ABNORMAL LOW (ref 8.9–10.3)
Chloride: 103 mmol/L (ref 98–111)
Creatinine, Ser: 1.32 mg/dL — ABNORMAL HIGH (ref 0.61–1.24)
GFR, Estimated: 59 mL/min — ABNORMAL LOW (ref >60)
Glucose, Bld: 106 mg/dL — ABNORMAL HIGH (ref 70–99)
Potassium: 2.4 mmol/L — CL (ref 3.5–5.1)
Sodium: 139 mmol/L (ref 135–145)
Total Bilirubin: 2.5 mg/dL — ABNORMAL HIGH (ref 0.0–1.2)
Total Protein: 5.9 g/dL — ABNORMAL LOW (ref 6.5–8.1)

## 2024-05-13 LAB — BASIC METABOLIC PANEL WITH GFR
Anion gap: 8 (ref 5–15)
BUN: 6 mg/dL — ABNORMAL LOW (ref 8–23)
CO2: 23 mmol/L (ref 22–32)
Calcium: 7.9 mg/dL — ABNORMAL LOW (ref 8.9–10.3)
Chloride: 106 mmol/L (ref 98–111)
Creatinine, Ser: 1.04 mg/dL (ref 0.61–1.24)
GFR, Estimated: >60 mL/min (ref >60)
Glucose, Bld: 146 mg/dL — ABNORMAL HIGH (ref 70–99)
Potassium: 3.1 mmol/L — ABNORMAL LOW (ref 3.5–5.1)
Sodium: 137 mmol/L (ref 135–145)

## 2024-05-13 LAB — ECHOCARDIOGRAM COMPLETE
Area-P 1/2: 5.13 cm2
Calc EF: 37 %
Est EF: 35
Height: 70.5 in
MV M vel: 4.33 m/s
MV Peak grad: 75 mmHg
S' Lateral: 4.1 cm
Single Plane A2C EF: 38.7 %
Single Plane A4C EF: 35.1 %
Weight: 2694.9 [oz_av]

## 2024-05-13 LAB — MAGNESIUM
Magnesium: 1.6 mg/dL — ABNORMAL LOW (ref 1.7–2.4)
Magnesium: 2.6 mg/dL — ABNORMAL HIGH (ref 1.7–2.4)

## 2024-05-13 LAB — LACTIC ACID, PLASMA: Lactic Acid, Venous: 2.1 mmol/L (ref 0.5–1.9)

## 2024-05-13 LAB — RPR: RPR Ser Ql: NONREACTIVE

## 2024-05-13 MED ORDER — POTASSIUM CHLORIDE 10 MEQ/100ML IV SOLN
10.0000 meq | INTRAVENOUS | Status: AC
  Administered 2024-05-13 (×4): 10 meq via INTRAVENOUS
  Filled 2024-05-13 (×4): qty 100

## 2024-05-13 MED ORDER — MELATONIN 3 MG PO TABS
3.0000 mg | ORAL_TABLET | Freq: Once | ORAL | Status: AC
  Administered 2024-05-13: 3 mg via ORAL
  Filled 2024-05-13: qty 1

## 2024-05-13 MED ORDER — MAGNESIUM SULFATE 2 GM/50ML IV SOLN
2.0000 g | Freq: Once | INTRAVENOUS | Status: AC
  Administered 2024-05-13: 2 g via INTRAVENOUS
  Filled 2024-05-13: qty 50

## 2024-05-13 MED ORDER — MAGNESIUM SULFATE 4 GM/100ML IV SOLN
4.0000 g | Freq: Once | INTRAVENOUS | Status: AC
  Administered 2024-05-13: 4 g via INTRAVENOUS
  Filled 2024-05-13: qty 100

## 2024-05-13 MED ORDER — POTASSIUM CHLORIDE CRYS ER 20 MEQ PO TBCR
40.0000 meq | EXTENDED_RELEASE_TABLET | Freq: Once | ORAL | Status: AC
  Administered 2024-05-13: 40 meq via ORAL
  Filled 2024-05-13: qty 2

## 2024-05-13 MED ORDER — ATORVASTATIN CALCIUM 80 MG PO TABS
80.0000 mg | ORAL_TABLET | Freq: Every day | ORAL | Status: DC
  Administered 2024-05-13 – 2024-05-17 (×5): 80 mg via ORAL
  Filled 2024-05-13 (×5): qty 1

## 2024-05-13 MED ORDER — POTASSIUM CHLORIDE CRYS ER 20 MEQ PO TBCR
40.0000 meq | EXTENDED_RELEASE_TABLET | Freq: Two times a day (BID) | ORAL | Status: AC
Start: 2024-05-13 — End: 2024-05-13
  Administered 2024-05-13 (×2): 40 meq via ORAL
  Filled 2024-05-13 (×2): qty 2

## 2024-05-13 MED ORDER — MAGNESIUM SULFATE 2 GM/50ML IV SOLN: 2.0000 g | Freq: Once | INTRAVENOUS | Status: DC

## 2024-05-13 MED ORDER — PNEUMOCOCCAL 20-VAL CONJ VACC 0.5 ML IM SUSY
0.5000 mL | PREFILLED_SYRINGE | INTRAMUSCULAR | Status: AC
  Administered 2024-05-16: 0.5 mL via INTRAMUSCULAR
  Filled 2024-05-13: qty 0.5

## 2024-05-13 MED ORDER — ALBUTEROL SULFATE (2.5 MG/3ML) 0.083% IN NEBU: 2.5000 mg | INHALATION_SOLUTION | Freq: Four times a day (QID) | RESPIRATORY_TRACT | Status: DC | PRN

## 2024-05-13 NOTE — Plan of Care (Signed)
  Problem: Education: Goal: Knowledge of General Education information will improve Description: Including pain rating scale, medication(s)/side effects and non-pharmacologic comfort measures Outcome: Progressing   Problem: Clinical Measurements: Goal: Respiratory complications will improve Outcome: Progressing   Problem: Elimination: Goal: Will not experience complications related to urinary retention Outcome: Progressing   Problem: Pain Managment: Goal: General experience of comfort will improve and/or be controlled Outcome: Progressing   Problem: Clinical Measurements: Goal: Ability to maintain clinical measurements within normal limits will improve Outcome: Not Progressing Goal: Will remain free from infection Outcome: Not Progressing

## 2024-05-13 NOTE — Progress Notes (Signed)
  Echocardiogram 2D Echocardiogram has been performed.  Donald Leblanc 05/13/2024, 10:06 AM

## 2024-05-13 NOTE — Progress Notes (Signed)
 Nursing paged about patient having desaturations to 88% while on room air.  Patient was placed on 2 L of nasal cannula by nursing, then resaturated up to 92%.  We went to go assess the patient for hypoxia.  Pleth did not appear to be poor and waveform.  Patient seemed to be respirating normally without increased work of breathing.  We had initially weaned down the oxygen to assess SpO2 status, but then were called down to a code.  After returning from the code, I went to assess the patient to see if he was still respirating normally, but the patient had his lunch and was minimally answering my questions.  He did seem to have increased work of breath, but had taken off his pulse ox in order to eat and so I could not appropriately assess his saturation status.  Informed nursing to monitor patient after his lunch, and if he is still having increased work of breath or is desaturating below 90%, to replace him on on the nasal cannula and to inform IMTS.  If persistently dyspneic with worsening shortness of breath, will consider getting chest x-ray to rule out signs of infection.  If patient's clinical status worsens, please page at the number below.  Marcelle Bebout, DO Internal Medicine Resident, PGY-1 Please contact the on call pager at [phone] for any urgent or emergent needs. 3:05 PM 05/13/2024

## 2024-05-13 NOTE — Progress Notes (Signed)
 PHARMACY - PHYSICIAN COMMUNICATION CRITICAL VALUE ALERT - BLOOD CULTURE IDENTIFICATION (BCID)  Donald Leblanc is an 68 y.o. male who presented to [facility] on 05/12/2024 with a chief complaint of syncope   Name of physician (or Provider) Contacted: Dr. Benuel  Current antibiotics: Ceftriaxone , Flagyl , Doxycyline   Changes to prescribed antibiotics recommended:  No changes for now  Results for orders placed or performed during the hospital encounter of 05/12/24  Blood Culture ID Panel (Reflexed) (Collected: 05/12/2024  2:55 PM)  Result Value Ref Range   Enterococcus faecalis NOT DETECTED NOT DETECTED   Enterococcus Faecium NOT DETECTED NOT DETECTED   Listeria monocytogenes NOT DETECTED NOT DETECTED   Staphylococcus species NOT DETECTED NOT DETECTED   Staphylococcus aureus (BCID) NOT DETECTED NOT DETECTED   Staphylococcus epidermidis NOT DETECTED NOT DETECTED   Staphylococcus lugdunensis NOT DETECTED NOT DETECTED   Streptococcus species NOT DETECTED NOT DETECTED   Streptococcus agalactiae NOT DETECTED NOT DETECTED   Streptococcus pneumoniae NOT DETECTED NOT DETECTED   Streptococcus pyogenes NOT DETECTED NOT DETECTED   A.calcoaceticus-baumannii NOT DETECTED NOT DETECTED   Bacteroides fragilis NOT DETECTED NOT DETECTED   Enterobacterales DETECTED (A) NOT DETECTED   Enterobacter cloacae complex NOT DETECTED NOT DETECTED   Escherichia coli DETECTED (A) NOT DETECTED   Klebsiella aerogenes NOT DETECTED NOT DETECTED   Klebsiella oxytoca NOT DETECTED NOT DETECTED   Klebsiella pneumoniae NOT DETECTED NOT DETECTED   Proteus species NOT DETECTED NOT DETECTED   Salmonella species NOT DETECTED NOT DETECTED   Serratia marcescens NOT DETECTED NOT DETECTED   Haemophilus influenzae NOT DETECTED NOT DETECTED   Neisseria meningitidis NOT DETECTED NOT DETECTED   Pseudomonas aeruginosa NOT DETECTED NOT DETECTED   Stenotrophomonas maltophilia NOT DETECTED NOT DETECTED   Candida albicans NOT  DETECTED NOT DETECTED   Candida auris NOT DETECTED NOT DETECTED   Candida glabrata NOT DETECTED NOT DETECTED   Candida krusei NOT DETECTED NOT DETECTED   Candida parapsilosis NOT DETECTED NOT DETECTED   Candida tropicalis NOT DETECTED NOT DETECTED   Cryptococcus neoformans/gattii NOT DETECTED NOT DETECTED   CTX-M ESBL NOT DETECTED NOT DETECTED   Carbapenem resistance IMP NOT DETECTED NOT DETECTED   Carbapenem resistance KPC NOT DETECTED NOT DETECTED   Carbapenem resistance NDM NOT DETECTED NOT DETECTED   Carbapenem resist OXA 48 LIKE NOT DETECTED NOT DETECTED   Carbapenem resistance VIM NOT DETECTED NOT DETECTED    Clair Agent 05/13/2024  6:22 AM

## 2024-05-13 NOTE — Plan of Care (Signed)
  Problem: Education: Goal: Knowledge of General Education information will improve Description: Including pain rating scale, medication(s)/side effects and non-pharmacologic comfort measures Outcome: Progressing   Problem: Clinical Measurements: Goal: Ability to maintain clinical measurements within normal limits will improve Outcome: Progressing   Problem: Clinical Measurements: Goal: Diagnostic test results will improve Outcome: Progressing   Problem: Clinical Measurements: Goal: Respiratory complications will improve Outcome: Progressing   Problem: Clinical Measurements: Goal: Cardiovascular complication will be avoided Outcome: Progressing   

## 2024-05-13 NOTE — Progress Notes (Addendum)
 HD#1 SUBJECTIVE:  Patient Summary: Donald Leblanc is a 68 y.o. with a pertinent PMH of  hypertension, hyperlipidemia, chronic pain, heart failure, who presented for a fainting episode and admitted for leukocytosis and a fever.   Overnight Events: Mg 1.1, K 2.4, supplemented this morning   Interim History: Patient had a good nights rest and has enough appetite to eat some breakfast.  Patient does not feel ill.  Patient denies chest pain or feelings of dizziness.  Patient denies feeling feverish or chills.  Patient denies dysuria.  Patient denies for tenderness around MSK injections.  Discussed patient has bacterial infection in his blood that likely started in his urinary tract.  Discussed patient is being treated with antibiotics and will be here at for at least 1 more night.  OBJECTIVE:  Vital Signs: Vitals:   05/12/24 2248 05/13/24 0023 05/13/24 0048 05/13/24 0309  BP:  (!) 98/57    Pulse:  78    Resp: 19  17 (!) 21  Temp:  99.5 F (37.5 C)    TempSrc:  Oral    SpO2:  95%    Weight:      Height:       Supplemental O2: Room Air SpO2: 95 % O2 Flow Rate (L/min): 2 L/min  Filed Weights   05/12/24 1328 05/12/24 1800  Weight: 74.8 kg 75.7 kg     Intake/Output Summary (Last 24 hours) at 05/13/2024 0402 Last data filed at 05/12/2024 2014 Gross per 24 hour  Intake 739.83 ml  Output 325 ml  Net 414.83 ml   Net IO Since Admission: 414.83 mL [05/13/24 0402]  Physical Exam: Physical Exam Vitals reviewed.  Constitutional:      Appearance: Normal appearance. He is not ill-appearing.  HENT:     Nose: No congestion or rhinorrhea.     Mouth/Throat:     Pharynx: Oropharynx is clear. No oropharyngeal exudate.  Eyes:     Conjunctiva/sclera: Conjunctivae normal.  Cardiovascular:     Rate and Rhythm: Normal rate.  Pulmonary:     Effort: Pulmonary effort is normal.  Abdominal:     General: Bowel sounds are normal.     Palpations: Abdomen is soft.     Tenderness: There is no  abdominal tenderness.     Comments: No suprapubic tenderness  Musculoskeletal:     Right lower leg: No edema.     Left lower leg: No edema.  Skin:    General: Skin is warm.  Neurological:     General: No focal deficit present.     Mental Status: He is alert and oriented to person, place, and time.  Psychiatric:        Mood and Affect: Mood normal.        Behavior: Behavior normal.     Patient Lines/Drains/Airways Status     Active Line/Drains/Airways     Name Placement date Placement time Site Days   Peripheral IV 05/12/24 18 G Right Forearm 05/12/24  1321  Forearm  1   Peripheral IV 05/12/24 20 G Right Antecubital 05/12/24  1510  Antecubital  1            Pertinent labs and imaging:      Latest Ref Rng & Units 05/13/2024    3:08 AM 05/12/2024    7:06 PM 05/12/2024    3:35 PM  CBC  WBC 4.0 - 10.5 K/uL 15.0  17.3    Hemoglobin 13.0 - 17.0 g/dL 88.8  87.8  85.6  Hematocrit 39.0 - 52.0 % 35.3  38.3  42.0   Platelets 150 - 400 K/uL 261  318         Latest Ref Rng & Units 05/12/2024    3:35 PM 05/12/2024    3:05 PM 04/28/2023   11:23 AM  CMP  Glucose 70 - 99 mg/dL 872  868  81   BUN 8 - 23 mg/dL 10  8  16    Creatinine 0.61 - 1.24 mg/dL 8.79  8.63  8.58   Sodium 135 - 145 mmol/L 138  137  135   Potassium 3.5 - 5.1 mmol/L 3.4  2.4  4.4   Chloride 98 - 111 mmol/L 96  98  99   CO2 22 - 32 mmol/L  26  22   Calcium  8.9 - 10.3 mg/dL  8.6  9.9   Total Protein 6.5 - 8.1 g/dL  7.1    Total Bilirubin 0.0 - 1.2 mg/dL  2.0    Alkaline Phos 38 - 126 U/L  57    AST 15 - 41 U/L  30    ALT 0 - 44 U/L  22      DG Chest Port 1 View Result Date: 05/12/2024 CLINICAL DATA:  Weakness. EXAM: PORTABLE CHEST 1 VIEW COMPARISON:  April 24, 2023. FINDINGS: Stable cardiomediastinal silhouette. Sternotomy wires are noted. Left lung is clear. Stable probable scarring seen in right upper lobe. Bony thorax is unremarkable. IMPRESSION: No active disease. Electronically Signed   By: Lynwood Landy Raddle M.D.   On: 05/12/2024 15:20    ASSESSMENT/PLAN:  Assessment: Principal Problem:   Syncope Active Problems:   CAD (coronary artery disease)   Fever, unspecified   Leukocytosis   Lactic acid acidosis   AKI (acute kidney injury) (HCC)   Plan: Donald Leblanc is a 68 y.o. person living with a history of hypertension, hyperlipidemia, chronic pain, heart failure who presented with fainting spell and admitted for leukocytosis and elevated temperature   Leukocytosis and fever Severe Sepsis 2/2 GNR Initial BP in ambulance 80/60.  WBC 15.8 on admission, temperature up to 102.7. Lactic acid in ED 3.6 and decreased to 2.9. WBC is 15 this morning, increased absolute neutrophils 10.2.  Blood cultures growing gram-negative rods, will continue with ceftriaxone  until susceptibilities report.  If ESBL producer, will switch to carbapenem.  Patient was last sexually active 2 weeks ago and initially had symptoms of genital itchiness in the ED, although denied discharge.  Given bacteria and leukocytes in urine, likely urinary source.  Patient did have injections for pain 2 weeks ago, concomitant steroids may have compromised immune status and lead to systemic infection.  Patient largely asymptomatic on exam, feeling well, vital signs stable this morning. - Narrowed to only IV ceftriaxone  2g daily -Acetaminophen  650 mg every 6 hours as needed -UA: moderate Hgb, large leukocytes, rare bacteria - Blood cultures growing gram-negative rods, BCID positive for E. coli -RVP , COVID, flu negative - RPR and HIV nonreactive - GC/chlamydia pending  Syncope HFrEF  Heart arrhythmia Home medications include isosorbide  mononitrate 30 mg daily and metoprolol  tartrate 25 mg twice daily.  Patient presented after fainting and with hypovolemia. First troponin in ED 40, repeat troponin 128 ->221->175.  Potassium 2.4 this morning, magnesium  and potassium supplementation given.  It is likely his syncopal episode was a result  of low blood pressure in the setting of sepsis.  This may have exacerbated previous heart conditions leading to dysrhythmia heard on initial exam.  Cardiac  exam was normal on encounter today.  Did not appreciate any murmurs, low suspicion for vegetation.   -Continue to monitor for new murmurs -Continue potassium chloride  40meq tablet twice daily -On telemetry - Echo 8/23 consistent with left ventricular ejection fraction 35%, inferior and posterior lateral wall hypokinesis, mild MV regurgitation - Orthostatics today  Coronary artery disease Cardiac cath in 8/24 insistent with severe three-vessel CAD status post three-vessel CABG with 3-3 patent bypass grafts, recommendation medical management - Continue home aspirin  81 mg daily, clopidogrel  75 mg daily   AKI Lactic acidosis Serum creatinine 1.36 on admission, per chart baseline appears to be around 0.9.  Possibly due to low volume in the setting of sepsis.  Potassium 2.4 on admission, will continue potassium and magnesium  supplementation.  Lactic acid in ED 3.6 and decreased to 2.9, it is 2.1 this morning.  Will stop trending.  Creatinine 1.32 this morning. -Repeat magnesium  and potassium labs this afternoon -CMP in AM  COPD On home medications include albuterol .   Chronic pain Peripheral arterial disease History of femoral artery stent and repair in 2014 and 2015.  ABIs in 6/25 were normal.  His pain today is mostly in his left leg, back pain is mild.  Hypercholesterolemia Home medications include atorvastatin  80 mg daily   Best Practice: Diet: Regular diet VTE: enoxaparin  (LOVENOX ) injection 40 mg Start: 05/12/24 1800 Code: Full  Disposition planning: Therapy Recs: Pending, DME: none DISPO: Anticipated discharge 1 to 2 days to Home pending IV antibiotics.  Signature:  Viktoria Charmayne [facility]  4:02 AM, 05/13/2024  On Call pager [phone]

## 2024-05-13 NOTE — Significant Event (Signed)
 Pt experiencing intermitted cough with low 02 levels. 86-89, Applied 2 L of 02 sats 92-85%   Paging attending MD

## 2024-05-13 NOTE — Progress Notes (Signed)
   05/13/24 1356  Assess: MEWS Score  Temp 98.4 F (36.9 C)  BP 136/80  MAP (mmHg) 95  Pulse Rate 93  ECG Heart Rate 93  Resp (!) 26  Level of Consciousness Alert  SpO2 (!) 88 %  O2 Device Room Air  Assess: MEWS Score  MEWS Temp 0  MEWS Systolic 0  MEWS Pulse 0  MEWS RR 2  MEWS LOC 0  MEWS Score 2  MEWS Score Color Yellow  Assess: if the MEWS score is Yellow or Red  Were vital signs accurate and taken at a resting state? Yes  Does the patient meet 2 or more of the SIRS criteria? Yes  Does the patient have a confirmed or suspected source of infection? Yes  MEWS guidelines implemented  Yes, yellow  Treat  MEWS Interventions Considered administering scheduled or prn medications/treatments as ordered  Take Vital Signs  Increase Vital Sign Frequency  Yellow: Q2hr x1, continue Q4hrs until patient remains green for 12hrs  Escalate  MEWS: Escalate Yellow: Discuss with charge nurse and consider notifying provider and/or RRT  Notify: Charge Nurse/RN  Name of Charge Nurse/RN Notified Garrel Peak  Provider Notification  Provider Name/Title Dr Charmayne  Date Provider Notified 05/13/24  Time Provider Notified 1410  Method of Notification Page  Notification Reason Change in status  Provider response No new orders;Other (Comment) (Apply 02 and re-evaluate per protocal.)  Date of Provider Response 05/13/24  Time of Provider Response 1412  Notify: Rapid Response  Name of Rapid Response RN Notified n/a  Assess: SIRS CRITERIA  SIRS Temperature  0  SIRS Respirations  1  SIRS Pulse 1  SIRS WBC 1  SIRS Score Sum  3   Applied O2 MD aware will recheck v/s in 2 hours

## 2024-05-13 NOTE — Progress Notes (Signed)
   05/13/24 1356  Assess: MEWS Score  Temp 98.4 F (36.9 C)  BP 136/80  MAP (mmHg) 95  Pulse Rate 93  ECG Heart Rate 93  Resp (!) 26  Level of Consciousness Alert  SpO2 (!) 88 %  O2 Device Room Air  Assess: MEWS Score  MEWS Temp 0  MEWS Systolic 0  MEWS Pulse 0  MEWS RR 2  MEWS LOC 0  MEWS Score 2  MEWS Score Color Yellow  Assess: if the MEWS score is Yellow or Red  Were vital signs accurate and taken at a resting state? Yes  Does the patient meet 2 or more of the SIRS criteria? Yes  Does the patient have a confirmed or suspected source of infection? Yes  MEWS guidelines implemented  Yes, yellow  Treat  MEWS Interventions Considered administering scheduled or prn medications/treatments as ordered  Take Vital Signs  Increase Vital Sign Frequency  Yellow: Q2hr x1, continue Q4hrs until patient remains green for 12hrs  Escalate  MEWS: Escalate Yellow: Discuss with charge nurse and consider notifying provider and/or RRT  Notify: Charge Nurse/RN  Name of Charge Nurse/RN Notified Garrel Peak  Provider Notification  Provider Name/Title Amalibila,Jaden  Date Provider Notified 05/13/24  Time Provider Notified 1410  Method of Notification Page  Notification Reason Change in status  Provider response No new orders;Other (Comment) (Apply 02 and re-evaluate per protocal.)  Date of Provider Response 05/13/24  Time of Provider Response 1412  Notify: Rapid Response  Name of Rapid Response RN Notified n/a  Assess: SIRS CRITERIA  SIRS Temperature  0  SIRS Respirations  1  SIRS Pulse 1  SIRS WBC 1  SIRS Score Sum  3   Corrected Residents name

## 2024-05-14 DIAGNOSIS — Z951 Presence of aortocoronary bypass graft: Secondary | ICD-10-CM

## 2024-05-14 DIAGNOSIS — A4151 Sepsis due to Escherichia coli [E. coli]: Principal | ICD-10-CM

## 2024-05-14 DIAGNOSIS — G4700 Insomnia, unspecified: Secondary | ICD-10-CM

## 2024-05-14 DIAGNOSIS — J449 Chronic obstructive pulmonary disease, unspecified: Secondary | ICD-10-CM

## 2024-05-14 LAB — CBC
HCT: 37.2 % — ABNORMAL LOW (ref 39.0–52.0)
Hemoglobin: 11.7 g/dL — ABNORMAL LOW (ref 13.0–17.0)
MCH: 26.7 pg (ref 26.0–34.0)
MCHC: 31.5 g/dL (ref 30.0–36.0)
MCV: 84.7 fL (ref 80.0–100.0)
Platelets: 291 K/uL (ref 150–400)
RBC: 4.39 MIL/uL (ref 4.22–5.81)
RDW: 15 % (ref 11.5–15.5)
WBC: 14.6 K/uL — ABNORMAL HIGH (ref 4.0–10.5)
nRBC: 0 % (ref 0.0–0.2)

## 2024-05-14 LAB — BASIC METABOLIC PANEL WITH GFR
Anion gap: 8 (ref 5–15)
BUN: 5 mg/dL — ABNORMAL LOW (ref 8–23)
CO2: 26 mmol/L (ref 22–32)
Calcium: 8.2 mg/dL — ABNORMAL LOW (ref 8.9–10.3)
Chloride: 105 mmol/L (ref 98–111)
Creatinine, Ser: 0.99 mg/dL (ref 0.61–1.24)
GFR, Estimated: >60 mL/min (ref >60)
Glucose, Bld: 122 mg/dL — ABNORMAL HIGH (ref 70–99)
Potassium: 3.1 mmol/L — ABNORMAL LOW (ref 3.5–5.1)
Sodium: 139 mmol/L (ref 135–145)

## 2024-05-14 LAB — MAGNESIUM: Magnesium: 2.2 mg/dL (ref 1.7–2.4)

## 2024-05-14 LAB — URINE CULTURE: Culture: NO GROWTH

## 2024-05-14 MED ORDER — POTASSIUM CHLORIDE CRYS ER 20 MEQ PO TBCR
40.0000 meq | EXTENDED_RELEASE_TABLET | Freq: Two times a day (BID) | ORAL | Status: AC
Start: 2024-05-14 — End: 2024-05-14
  Administered 2024-05-14 (×2): 40 meq via ORAL
  Filled 2024-05-14 (×2): qty 2

## 2024-05-14 MED ORDER — TRAZODONE HCL 50 MG PO TABS
50.0000 mg | ORAL_TABLET | Freq: Every evening | ORAL | Status: DC | PRN
  Administered 2024-05-14 – 2024-05-16 (×3): 50 mg via ORAL
  Filled 2024-05-14 (×3): qty 1

## 2024-05-14 MED ORDER — SACUBITRIL-VALSARTAN 49-51 MG PO TABS
1.0000 | ORAL_TABLET | Freq: Two times a day (BID) | ORAL | Status: DC
  Administered 2024-05-14 – 2024-05-17 (×7): 1 via ORAL
  Filled 2024-05-14 (×7): qty 1

## 2024-05-14 MED ORDER — FUROSEMIDE 40 MG PO TABS
40.0000 mg | ORAL_TABLET | Freq: Once | ORAL | Status: AC
  Administered 2024-05-14: 40 mg via ORAL
  Filled 2024-05-14: qty 1

## 2024-05-14 NOTE — Plan of Care (Signed)

## 2024-05-14 NOTE — Progress Notes (Signed)
Nurse requested Mobility Specialist to perform oxygen saturation test with pt which includes removing pt from oxygen both at rest and while ambulating.  Below are the results from that testing.     Patient Saturations on Room Air at Rest = spO2 87%  Patient Saturations on Room Air while Ambulating = sp02 N/A% .   Patient Saturations on 2 Liters of oxygen while Ambulating = sp02 91%  At end of testing pt left in room on 2 Liters of oxygen.  Reported results to nurse.   Addison Lank Mobility Specialist Please contact via SecureChat or  Rehab office at [phone]

## 2024-05-14 NOTE — Progress Notes (Signed)
 Mobility Specialist Progress Note:   05/14/24 1200  Orthostatic Lying   BP- Lying 145/86  Orthostatic Sitting  BP- Sitting 128/87  Orthostatic Standing at 0 minutes  BP- Standing at 0 minutes 133/72  Mobility  Activity Ambulated with assistance;Stood at bedside  Level of Assistance Contact guard assist, steadying assist  Assistive Device None  Distance Ambulated (ft) 20 ft  Activity Response Tolerated well  Mobility Referral Yes  Mobility visit 1 Mobility  Mobility Specialist Start Time (ACUTE ONLY) 1200  Mobility Specialist Stop Time (ACUTE ONLY) 1220  Mobility Specialist Time Calculation (min) (ACUTE ONLY) 20 min   Pt agreeable to mobility session. Required close supervision for safety throughout. Orthostatics negative, however pt symptomatic. C/o generalized fatigue and just not feeling well. Required 2LO2 to maintain SpO2 WFL at this time. Back in bed with all needs met.   Therisa Rana Mobility Specialist Please contact via SecureChat or  Rehab office at [phone]

## 2024-05-14 NOTE — Plan of Care (Signed)

## 2024-05-14 NOTE — Progress Notes (Addendum)
 HD#2 Subjective:   Summary: This is 68 year old male of HTN, HLD, CAD s/p CABG 2024, HFrEF who presented with concerns for a syncopal episode, found to have sepsis with e coli bacteriemia.   Overnight Events: No overnight events   Patient evaluated at bedside this morning.  Patient reports she had trouble sleeping.  He states he is having to use the bathroom multiple times.  He did try melatonin which did not help.  He wants to eat.  Objective:  Vital signs in last 24 hours: Vitals:   05/13/24 2320 05/14/24 0343 05/14/24 0652 05/14/24 0700  BP: 131/85 (!) 144/84    Pulse: 86 90    Resp: 20 20  20   Temp: 98.6 F (37 C) 98 F (36.7 C)    TempSrc: Oral Oral    SpO2: 98% 96%    Weight:   77.3 kg   Height:       Supplemental O2: Nasal Cannula SpO2: 96 % O2 Flow Rate (L/min): 2 L/min   Physical Exam:  Constitutional: Sitting  in bed, no acute distress HENT: normocephalic atraumatic Cardiovascular: regular rate and rhythm, no m/r/g Pulmonary: Crackles heard to bibasilar lung fields Abdominal: soft, non-tender, non-distended Ext: No edema noted to the bilateral lower extremities    Filed Weights   05/12/24 1800 05/13/24 0426 05/14/24 0652  Weight: 75.7 kg 76.4 kg 77.3 kg     Intake/Output Summary (Last 24 hours) at 05/14/2024 0917 Last data filed at 05/14/2024 0344 Gross per 24 hour  Intake 553.31 ml  Output 1060 ml  Net -506.69 ml   Net IO Since Admission: -920.21 mL [05/14/24 0917]  Pertinent Labs:    Latest Ref Rng & Units 05/14/2024    2:39 AM 05/13/2024    3:08 AM 05/12/2024    7:06 PM  CBC  WBC 4.0 - 10.5 K/uL 14.6  15.0  17.3   Hemoglobin 13.0 - 17.0 g/dL 88.2  88.8  87.8   Hematocrit 39.0 - 52.0 % 37.2  35.3  38.3   Platelets 150 - 400 K/uL 291  261  318        Latest Ref Rng & Units 05/14/2024    2:39 AM 05/13/2024    4:42 PM 05/13/2024    3:08 AM  CMP  Glucose 70 - 99 mg/dL 877  853  893   BUN 8 - 23 mg/dL 5  6  7    Creatinine 0.61 - 1.24  mg/dL 9.00  8.95  8.67   Sodium 135 - 145 mmol/L 139  137  139   Potassium 3.5 - 5.1 mmol/L 3.1  3.1  2.4   Chloride 98 - 111 mmol/L 105  106  103   CO2 22 - 32 mmol/L 26  23  27    Calcium  8.9 - 10.3 mg/dL 8.2  7.9  7.9   Total Protein 6.5 - 8.1 g/dL   5.9   Total Bilirubin 0.0 - 1.2 mg/dL   2.5   Alkaline Phos 38 - 126 U/L   49   AST 15 - 41 U/L   22   ALT 0 - 44 U/L   19     Imaging: ECHOCARDIOGRAM COMPLETE Result Date: 05/13/2024    ECHOCARDIOGRAM REPORT   Patient Name:   Donald Leblanc Date of Exam: 05/13/2024 Medical Rec #:  [id number]       Height:       70.5 in Accession #:    [id number]  Weight:       168.4 lb Date of Birth:  December 15, 1955      BSA:          1.951 m Patient Age:    67 years        BP:           136/81 mmHg Patient Gender: M               HR:           85 bpm. Exam Location:  Inpatient Procedure: 2D Echo (Both Spectral and Color Flow Doppler were utilized during            procedure). Indications:    syncope  History:        Patient has prior history of Echocardiogram examinations, most                 recent 04/24/2023. Prior CABG, COPD; Risk Factors:Former Smoker.  Sonographer:    Tinnie Barefoot RDCS Referring Phys: [id number] ERIK C HOFFMAN IMPRESSIONS  1. Inferior and posterior lateral wall hypokinesis . Left ventricular ejection fraction, by estimation, is 35%. The left ventricle has normal function. The left ventricle demonstrates regional wall motion abnormalities (see scoring diagram/findings for description). The left ventricular internal cavity size was mildly dilated. Left ventricular diastolic parameters are consistent with Grade II diastolic dysfunction (pseudonormalization). Elevated left ventricular end-diastolic pressure.  2. Right ventricular systolic function is normal. The right ventricular size is moderately enlarged. Moderately increased right ventricular wall thickness. There is mildly elevated pulmonary artery systolic pressure.  3. Left atrial size was  moderately dilated.  4. Right atrial size was mildly dilated.  5. The mitral valve is abnormal. Mild mitral valve regurgitation. No evidence of mitral stenosis.  6. The aortic valve is tricuspid. There is mild calcification of the aortic valve. There is mild thickening of the aortic valve. Aortic valve regurgitation is not visualized. Aortic valve sclerosis is present, with no evidence of aortic valve stenosis.  7. The inferior vena cava is normal in size with greater than 50% respiratory variability, suggesting right atrial pressure of 3 mmHg. FINDINGS  Left Ventricle: Inferior and posterior lateral wall hypokinesis. Left ventricular ejection fraction, by estimation, is 35%. The left ventricle has normal function. The left ventricle demonstrates regional wall motion abnormalities. Strain was performed and the global longitudinal strain is indeterminate. The left ventricular internal cavity size was mildly dilated. There is no left ventricular hypertrophy. Left ventricular diastolic parameters are consistent with Grade II diastolic dysfunction (pseudonormalization). Elevated left ventricular end-diastolic pressure. Right Ventricle: The right ventricular size is moderately enlarged. Moderately increased right ventricular wall thickness. Right ventricular systolic function is normal. There is mildly elevated pulmonary artery systolic pressure. The tricuspid regurgitant velocity is 2.95 m/s, and with an assumed right atrial pressure of 3 mmHg, the estimated right ventricular systolic pressure is 37.8 mmHg. Left Atrium: Left atrial size was moderately dilated. Right Atrium: Right atrial size was mildly dilated. Pericardium: There is no evidence of pericardial effusion. Mitral Valve: The mitral valve is abnormal. There is mild thickening of the mitral valve leaflet(s). Mild mitral valve regurgitation. No evidence of mitral valve stenosis. Tricuspid Valve: The tricuspid valve is normal in structure. Tricuspid valve  regurgitation is mild . No evidence of tricuspid stenosis. Aortic Valve: The aortic valve is tricuspid. There is mild calcification of the aortic valve. There is mild thickening of the aortic valve. Aortic valve regurgitation is not visualized. Aortic valve sclerosis is present, with  no evidence of aortic valve stenosis. Pulmonic Valve: The pulmonic valve was normal in structure. Pulmonic valve regurgitation is not visualized. No evidence of pulmonic stenosis. Aorta: The aortic root is normal in size and structure. Venous: The inferior vena cava is normal in size with greater than 50% respiratory variability, suggesting right atrial pressure of 3 mmHg. IAS/Shunts: No atrial level shunt detected by color flow Doppler. Additional Comments: 3D was performed not requiring image post processing on an independent workstation and was indeterminate.  LEFT VENTRICLE PLAX 2D LVIDd:         5.30 cm     Diastology LVIDs:         4.10 cm     LV e' medial:    4.68 cm/s LV PW:         0.90 cm     LV E/e' medial:  25.6 LV IVS:        0.80 cm     LV e' lateral:   7.83 cm/s LVOT diam:     2.00 cm     LV E/e' lateral: 15.3 LV SV:         50 LV SV Index:   26 LVOT Area:     3.14 cm  LV Volumes (MOD) LV vol d, MOD A2C: 71.5 ml LV vol d, MOD A4C: 81.3 ml LV vol s, MOD A2C: 43.8 ml LV vol s, MOD A4C: 52.8 ml LV SV MOD A2C:     27.7 ml LV SV MOD A4C:     81.3 ml LV SV MOD BP:      28.5 ml RIGHT VENTRICLE            IVC RV Basal diam:  2.70 cm    IVC diam: 1.50 cm RV S prime:     4.79 cm/s TAPSE (M-mode): 1.0 cm LEFT ATRIUM             Index        RIGHT ATRIUM           Index LA diam:        4.70 cm 2.41 cm/m   RA Area:     15.00 cm LA Vol (A2C):   44.7 ml 22.92 ml/m  RA Volume:   39.80 ml  20.40 ml/m LA Vol (A4C):   54.9 ml 28.14 ml/m LA Biplane Vol: 54.8 ml 28.09 ml/m  AORTIC VALVE LVOT Vmax:   87.30 cm/s LVOT Vmean:  58.200 cm/s LVOT VTI:    0.159 m  AORTA Ao Root diam: 2.90 cm Ao Asc diam:  2.80 cm MITRAL VALVE                 TRICUSPID VALVE MV Area (PHT): 5.13 cm     TR Peak grad:   34.8 mmHg MV Decel Time: 148 msec     TR Vmax:        295.00 cm/s MR Peak grad: 75.0 mmHg MR Mean grad: 50.0 mmHg     SHUNTS MR Vmax:      433.00 cm/s   Systemic VTI:  0.16 m MR Vmean:     335.0 cm/s    Systemic Diam: 2.00 cm MV E velocity: 120.00 cm/s MV A velocity: 64.90 cm/s MV E/A ratio:  1.85 Maude Emmer MD Electronically signed by Maude Emmer MD Signature Date/Time: 05/13/2024/11:04:09 AM    Final     Assessment/Plan:   Principal Problem:   Severe sepsis [facility]) Active Problems:   CAD (coronary artery disease)  Syncope   AKI (acute kidney injury) [facility])   Patient Summary: Donald Leblanc is a 68 y.o. male of HTN, HLD, CAD s/p CABG 2024, HFrEF who presented with concerns for a syncopal episode, found to have sepsis with e coli bacteriemia.   #Severe sepsis, improving #E. coli bacteremia Urine culture still processing.  Follow-up sensitivities for E. Coli.  Today is day 2 of ceftriaxone .  Unclear source at this time as urine culture did not grow anything.  White count is slowly trending down.  Patient has been afebrile.  Lactic acid is cleared.  Will continue to follow the sensitivities. - Ceftriaxone  day 2 - Follow-up blood cultures and sensitives  - Monitor white count and fever curve  #Syncope Likely in the setting of sepsis.  Have successfully volume resuscitated patient.  No obvious signs of arrhythmia.  No acute concerns at this time. - Ambulate in hall today - Check pulse oximetry while ambulating today - Check orthostatics today  #HFrEF Echo yesterday EF of 35%.  Home medications include Lasix  40 mg daily, Imdur  30 mg daily, spironolactone  25 mg daily, Entresto  49-51 mg daily.  Will slowly start resuming GDMT.  Patient does have a new oxygen requirement.  This is likely in setting of volume overload from fluid resuscitation.  Will give 1 dose of Lasix  today to see how he does. - Resume home Entresto  49-51 mg twice  daily - Give one dose of lasix  to see if this can help with new oxygen requirement  - Hold home Imdur  30 mg daily - Hold home spironolactone  25 mg daily - Interestingly enough, patient does not seem to be on a beta-blocker at home, can potentially start 1 while patient is inpatient.  #CAD status post CABG April 2024 Echo yesterday showing inferior and posterior lateral wall hypokinesis with regional wall motion abnormalities.  Unclear if these are new or from previous echoes.  However previous echoes did not show evidence of regional wall motion abnormality in posterior lateral wall.  I wonder if patient did have an ischemic event causing this event but unlikely given Trop peaked at 221.  Will consult cardiology to see if patient needs ischemic workup. - Continue aspirin  81 mg daily - Continue Lipitor  80 mg daily - Continue Plavix  75 mg daily - Call cardiology to see if echocardiogram truly is different from previous  #Hypokalemia Low K at 3.1. Will replete and check in the AM.   #COPD No acute concern for exacerbation at this time -Continue albuterol  PRN   #Insomnia Will trial trazodone   Diet: Heart Healthy IVF: None,None VTE: Enoxaparin  Code: Full PT/OT recs: None, none.  Dispo: Anticipated discharge to Home in 3 days pending Clinical improvment.   Libby Blanch DO Internal Medicine Resident PGY-2 Please contact the on call pager after 5 pm and on weekends at [phone]

## 2024-05-14 NOTE — Progress Notes (Signed)
 Asked by primary team to evaluate recent echo, concerns about a new wall motion abnormality  04/2023 and 04/2024 echos compared. LVEF does look mildly improved in the 04/2024 echo. Mild relative improvement in wall motion but overall still a degree of global hypokinesis that is more prominent in the inferior wall. Essentially the inferior wall has not improved vs the 2024 study while there has been some mild improvement in the other wall segments. Wall segments can vary in degree of recovery, there is no evidence of acute decline in any wall motion segment. He had repeat cath 04/2023 with patent grafts. No further cardiac testing would be indicated based on echo. Mild trop is consistent with demand ischemia in setting of severe sepsis.   JINNY Ross MD

## 2024-05-15 ENCOUNTER — Inpatient Hospital Stay (HOSPITAL_COMMUNITY)

## 2024-05-15 DIAGNOSIS — E876 Hypokalemia: Secondary | ICD-10-CM

## 2024-05-15 DIAGNOSIS — J9601 Acute respiratory failure with hypoxia: Secondary | ICD-10-CM

## 2024-05-15 DIAGNOSIS — Z9981 Dependence on supplemental oxygen: Secondary | ICD-10-CM

## 2024-05-15 DIAGNOSIS — Z792 Long term (current) use of antibiotics: Secondary | ICD-10-CM

## 2024-05-15 LAB — CBC
HCT: 35 % — ABNORMAL LOW (ref 39.0–52.0)
Hemoglobin: 11.1 g/dL — ABNORMAL LOW (ref 13.0–17.0)
MCH: 26.7 pg (ref 26.0–34.0)
MCHC: 31.7 g/dL (ref 30.0–36.0)
MCV: 84.3 fL (ref 80.0–100.0)
Platelets: 303 K/uL (ref 150–400)
RBC: 4.15 MIL/uL — ABNORMAL LOW (ref 4.22–5.81)
RDW: 15 % (ref 11.5–15.5)
WBC: 8.4 K/uL (ref 4.0–10.5)
nRBC: 0 % (ref 0.0–0.2)

## 2024-05-15 LAB — CULTURE, BLOOD (ROUTINE X 2): Special Requests: ADEQUATE

## 2024-05-15 LAB — BASIC METABOLIC PANEL WITH GFR
Anion gap: 12 (ref 5–15)
BUN: 5 mg/dL — ABNORMAL LOW (ref 8–23)
CO2: 22 mmol/L (ref 22–32)
Calcium: 8.2 mg/dL — ABNORMAL LOW (ref 8.9–10.3)
Chloride: 106 mmol/L (ref 98–111)
Creatinine, Ser: 0.98 mg/dL (ref 0.61–1.24)
GFR, Estimated: >60 mL/min (ref >60)
Glucose, Bld: 119 mg/dL — ABNORMAL HIGH (ref 70–99)
Potassium: 3.3 mmol/L — ABNORMAL LOW (ref 3.5–5.1)
Sodium: 140 mmol/L (ref 135–145)

## 2024-05-15 LAB — GC/CHLAMYDIA PROBE AMP (~~LOC~~) NOT AT ARMC
Chlamydia: NEGATIVE
Comment: NEGATIVE
Comment: NORMAL
Neisseria Gonorrhea: NEGATIVE

## 2024-05-15 LAB — MAGNESIUM: Magnesium: 1.9 mg/dL (ref 1.7–2.4)

## 2024-05-15 MED ORDER — POTASSIUM CHLORIDE CRYS ER 20 MEQ PO TBCR
40.0000 meq | EXTENDED_RELEASE_TABLET | Freq: Four times a day (QID) | ORAL | Status: AC
  Administered 2024-05-15 (×2): 40 meq via ORAL
  Filled 2024-05-15 (×2): qty 2

## 2024-05-15 MED ORDER — CEFADROXIL 500 MG PO CAPS
1000.0000 mg | ORAL_CAPSULE | Freq: Two times a day (BID) | ORAL | Status: DC
  Administered 2024-05-16 – 2024-05-17 (×3): 1000 mg via ORAL
  Filled 2024-05-15 (×3): qty 2

## 2024-05-15 MED ORDER — MAGNESIUM SULFATE 2 GM/50ML IV SOLN
2.0000 g | Freq: Once | INTRAVENOUS | Status: AC
  Administered 2024-05-15: 2 g via INTRAVENOUS
  Filled 2024-05-15: qty 50

## 2024-05-15 NOTE — Evaluation (Signed)
 Physical Therapy Evaluation Patient Details Name: Donald Leblanc MRN: [id number] DOB: 12/04/1955 Today's Date: 05/15/2024  History of Present Illness  68 yo male presents to [facility] on 8/22 with syncope. Workup for sepsis due to e. Coli bacteremia. PMH includes CAD s/p CABG 12/2022, HTN, sciatica, heart failure.  Clinical Impression  Pt is progressing below baseline level of functioning. Currently pt is Mod I for bed mobility, CGA for sit to stand and CGA/Min A for gait without an AD. Pt would potentially benefit from AD on return home in order to decrease risk for falls but pt would like to not use an AD despite education. Pt has poor safety awareness. Due to pt current functional status, home set up and available assistance at home recommending skilled physical therapy services 3x/week in order to address strength, balance and functional mobility to decrease risk for falls, injury and re-hospitalization.           If plan is discharge home, recommend the following: A little help with walking and/or transfers;Assistance with cooking/housework;Assist for transportation     Equipment Recommendations None recommended by PT     Functional Status Assessment Patient has had a recent decline in their functional status and demonstrates the ability to make significant improvements in function in a reasonable and predictable amount of time.     Precautions / Restrictions Precautions Precautions: Fall Recall of Precautions/Restrictions: Intact Restrictions Weight Bearing Restrictions Per Provider Order: No      Mobility  Bed Mobility Overal bed mobility: Modified Independent   General bed mobility comments: lightly used rails. HOB slightly up    Transfers Overall transfer level: Needs assistance Equipment used: None Transfers: Sit to/from Stand Sit to Stand: Contact guard assist           General transfer comment: CGA for stabilization    Ambulation/Gait Ambulation/Gait assistance:  Contact guard assist, Min assist Gait Distance (Feet): 150 Feet Assistive device: None Gait Pattern/deviations: Step-through pattern, Decreased stride length, Trunk flexed Gait velocity: decreased Gait velocity interpretation: <1.8 ft/sec, indicate of risk for recurrent falls   General Gait Details: 1x sitting rest break after 75 ft of gait. O2 sats down to 82% with room air; requires 2 L to get back up to 88%. Pt unaware of O2 needs. Decreased stride length 1x LOB requiring Min A to maintain balance from physical therapist with pt utilizing stepping strategy at cross over step     Balance Overall balance assessment: Needs assistance Sitting-balance support: Single extremity supported, Feet supported Sitting balance-Leahy Scale: Good     Standing balance support: No upper extremity supported, During functional activity Standing balance-Leahy Scale: Poor Standing balance comment: 1x LOB. CGA for safety throughout. Min A for pt to regain balance. Bypasses ankle/hip strategy to stepping strategy       Pertinent Vitals/Pain Pain Assessment Pain Assessment: 0-10 Pain Score: 8  Pain Location: LLE from low back Pain Descriptors / Indicators: Sharp, Aching Pain Intervention(s): Limited activity within patient's tolerance, Monitored during session    Home Living Family/patient expects to be discharged to:: Private residence Living Arrangements: Non-relatives/Friends (lives with friend) Available Help at Discharge: Available PRN/intermittently;Friend(s) Type of Home: House Home Access: Level entry       Home Layout: One level Home Equipment: Cane - single point      Prior Function Prior Level of Function : Independent/Modified Independent;Driving             Mobility Comments: Pt reports that he ambulates without an AD; Denies falls  besides syncope episode that resulted in this hospitalization. ADLs Comments: Ind with ADL's and IADLs.     Extremity/Trunk Assessment    Upper Extremity Assessment Upper Extremity Assessment: Overall WFL for tasks assessed    Lower Extremity Assessment Lower Extremity Assessment: Overall WFL for tasks assessed;LLE deficits/detail LLE Deficits / Details: pain from sciatica    Cervical / Trunk Assessment Cervical / Trunk Assessment: Normal  Communication   Communication Communication: No apparent difficulties    Cognition Arousal: Alert Behavior During Therapy: WFL for tasks assessed/performed   PT - Cognitive impairments: No apparent impairments       Following commands: Intact       Cueing Cueing Techniques: Verbal cues     General Comments General comments (skin integrity, edema, etc.): Pt on 1L O2 initially at 98%, Resting 95% on room air. Initially with gait 94-95% room air then quickly dropping to low 80's on room air with good pleth line requires 2 L to get back up to 88%.        Assessment/Plan    PT Assessment Patient needs continued PT services  PT Problem List Cardiopulmonary status limiting activity;Decreased activity tolerance;Decreased balance;Decreased mobility;Decreased safety awareness       PT Treatment Interventions DME instruction;Balance training;Gait training;Neuromuscular re-education;Stair training;Functional mobility training;Therapeutic activities;Therapeutic exercise;Patient/family education    PT Goals (Current goals can be found in the Care Plan section)  Acute Rehab PT Goals Patient Stated Goal: to return home and improve mobility PT Goal Formulation: With patient Time For Goal Achievement: 05/29/24 Potential to Achieve Goals: Good    Frequency Min 2X/week        AM-PAC PT 6 Clicks Mobility  Outcome Measure Help needed turning from your back to your side while in a flat bed without using bedrails?: None Help needed moving from lying on your back to sitting on the side of a flat bed without using bedrails?: None Help needed moving to and from a bed to a chair  (including a wheelchair)?: A Little Help needed standing up from a chair using your arms (e.g., wheelchair or bedside chair)?: A Little Help needed to walk in hospital room?: A Little Help needed climbing 3-5 steps with a railing? : A Lot 6 Click Score: 19    End of Session Equipment Utilized During Treatment: Gait belt;Oxygen Activity Tolerance: Patient tolerated treatment well Patient left: in bed;with call bell/phone within reach;with bed alarm set Nurse Communication: Mobility status PT Visit Diagnosis: Other abnormalities of gait and mobility (R26.89);Unsteadiness on feet (R26.81);Muscle weakness (generalized) (M62.81)    Time: 8678-8658 PT Time Calculation (min) (ACUTE ONLY): 20 min   Charges:   PT Evaluation $PT Eval Low Complexity: 1 Low   PT General Charges $$ ACUTE PT VISIT: 1 Visit         Dorothyann Maier, DPT, CLT  Acute Rehabilitation Services Office: [phone] (Secure chat preferred)   Dorothyann VEAR Maier 05/15/2024, 2:13 PM

## 2024-05-15 NOTE — Plan of Care (Signed)
  Problem: Education: Goal: Knowledge of General Education information will improve Description: Including pain rating scale, medication(s)/side effects and non-pharmacologic comfort measures Outcome: Progressing   Problem: Clinical Measurements: Goal: Ability to maintain clinical measurements within normal limits will improve Outcome: Progressing Goal: Will remain free from infection Outcome: Progressing Goal: Diagnostic test results will improve Outcome: Progressing Goal: Respiratory complications will improve Outcome: Progressing Goal: Cardiovascular complication will be avoided Outcome: Progressing   Problem: Pain Managment: Goal: General experience of comfort will improve and/or be controlled Outcome: Progressing

## 2024-05-15 NOTE — Progress Notes (Signed)
 Mobility Specialist Progress Note:    05/15/24 1136  Mobility  Activity Ambulated with assistance;Dangled on edge of bed  Level of Assistance Standby assist, set-up cues, supervision of patient - no hands on  Assistive Device None  Distance Ambulated (ft) 20 ft  Activity Response Tolerated well  Mobility Referral Yes  Mobility visit 1 Mobility  Mobility Specialist Start Time (ACUTE ONLY) 1136  Mobility Specialist Stop Time (ACUTE ONLY) 1143  Mobility Specialist Time Calculation (min) (ACUTE ONLY) 7 min   Pt willing to ambulate and stated they were feeling well but only willing to do it within room. No c/o any symptoms. Pt moving well in bed and ambulating but takes a little time. Returned pt to EOB w/ all needs met.   Venetia Keel Mobility Specialist Please Neurosurgeon or Rehab Office at [phone]

## 2024-05-15 NOTE — Progress Notes (Signed)
 HD#3 Subjective:   Summary: This is 68 year old male of HTN, HLD, CAD s/p CABG 2024, HFrEF who presented with concerns for a syncopal episode, found to have sepsis with e coli bacteriemia.   Overnight Events: No overnight events   Patient stated that he is doing well. He feels like his breathing is better than when he came in to the hospital. Did wean oxygen down while he was in the bed and he was saturating in the mid 90s. He stats that he feels like he is ready to go home tomorrow likely. He denies any fevers or chills.   Objective:  Vital signs in last 24 hours: Vitals:   05/14/24 1529 05/14/24 2011 05/14/24 2353 05/15/24 0425  BP: (!) 129/98 (!) 108/94 (!) 119/56 131/80  Pulse: 87 77 71 77  Resp: 17 20  19   Temp:  98 F (36.7 C) 99.8 F (37.7 C) 98.5 F (36.9 C)  TempSrc:  Oral Axillary Oral  SpO2: 90% 94% 96% 91%  Weight:    77 kg  Height:       Supplemental O2: Nasal Cannula SpO2: 91 % O2 Flow Rate (L/min): 2 L/min   Physical Exam:  Constitutional: Sitting  in bed, no acute distress HENT: normocephalic atraumatic Cardiovascular: regular rate and rhythm, no m/r/g Pulmonary:fine crackles heard to bibasilar lung fields posteriorly in lower and mid lung fields  Abdominal: soft, non-tender, non-distended Ext: No edema noted to the bilateral lower extremities    Filed Weights   05/13/24 0426 05/14/24 0652 05/15/24 0425  Weight: 76.4 kg 77.3 kg 77 kg     Intake/Output Summary (Last 24 hours) at 05/15/2024 0634 Last data filed at 05/15/2024 0005 Gross per 24 hour  Intake 220 ml  Output 950 ml  Net -730 ml   Net IO Since Admission: -1,650.21 mL [05/15/24 0634]  Pertinent Labs:    Latest Ref Rng & Units 05/14/2024    2:39 AM 05/13/2024    3:08 AM 05/12/2024    7:06 PM  CBC  WBC 4.0 - 10.5 K/uL 14.6  15.0  17.3   Hemoglobin 13.0 - 17.0 g/dL 88.2  88.8  87.8   Hematocrit 39.0 - 52.0 % 37.2  35.3  38.3   Platelets 150 - 400 K/uL 291  261  318         Latest Ref Rng & Units 05/14/2024    2:39 AM 05/13/2024    4:42 PM 05/13/2024    3:08 AM  CMP  Glucose 70 - 99 mg/dL 877  853  893   BUN 8 - 23 mg/dL 5  6  7    Creatinine 0.61 - 1.24 mg/dL 9.00  8.95  8.67   Sodium 135 - 145 mmol/L 139  137  139   Potassium 3.5 - 5.1 mmol/L 3.1  3.1  2.4   Chloride 98 - 111 mmol/L 105  106  103   CO2 22 - 32 mmol/L 26  23  27    Calcium  8.9 - 10.3 mg/dL 8.2  7.9  7.9   Total Protein 6.5 - 8.1 g/dL   5.9   Total Bilirubin 0.0 - 1.2 mg/dL   2.5   Alkaline Phos 38 - 126 U/L   49   AST 15 - 41 U/L   22   ALT 0 - 44 U/L   19     Imaging: No results found.   Assessment/Plan:   Principal Problem:   Severe sepsis (HCC) Active Problems:  CAD (coronary artery disease)   Syncope   AKI (acute kidney injury) [facility])   Patient Summary: Donald Leblanc is a 68 y.o. male of HTN, HLD, CAD s/p CABG 2024, HFrEF who presented with concerns for a syncopal episode, found to have sepsis with e coli bacteriemia.   #Severe sepsis, improving #E. coli bacteremia Urine culture showing no growth.  E coli sensitives show that patient is pan sensitive. Today is day 3 of ceftriaxone .  Unclear source at this time.  Leukocytosis resolved today.  Patient has been afebrile.   - Ceftriaxone  day 3, transitioned to PO Cefadroxil  1000 mg BID starting tomorrow. Course would is 7 days  - Follow-up blood culture sensitivities. - monitor fever curve   #acute hypoxic respiratory failure #New oxygen requirement Patient developed oxygen requirement on Day 2 of hospitalization. Was initially thought to be due to fluid resuscitation in the setting of sepsis. Patient was given lasix  40 mg yesterday, do not feel that he requires any more diuresis based on exam today. Today on exam, patient had some mild crackles posteriorly in lower and mid lung fields. No lower extremity edema noted. At rest patient saturates in the mid 90s without oxygen. With ambulation he reports some SOB, and dropped to  low 80% on room air and required 2L to get back up to 88%.  - Continue to work with PT/OT  -Will assess oxygen needs tomorrow  - Pulmonary hygiene ordered with chest physiotherapy, flutter valve and IS - Will also get CXR to assess for any acute cardiopulmonary disease   #HFrEF Echo yesterday EF of 35%.  Home medications include Lasix  40 mg daily, Imdur  30 mg daily, spironolactone  25 mg daily, Entresto  49-51 mg daily.  Patient developed oxygen requirement on Day 2 of hospitalization, likely in the setting of fluid resuscitation due to sepsis. Given lasix  40 mg yesterday, but do not believe patient is volume overloaded from exam. Do not need to add any further diuresis at this time. Can consider adding back spironolactone  imdur  tomorrow as long as blood pressures can tolerate.  - Resume home Entresto  49-51 mg twice daily - Hold home Imdur  30 mg daily - Hold home spironolactone  25 mg daily - Beta blocker not indicated as patient has history of heart block in the past   #CAD status post CABG April 2024 Cardiology was consulted yesterday for new wall motion abnormalities seen in inferior wall. They said overall mild relative improvement in wall motion but still has a degree of global hypokinesis. Overall echo did not show any acute evidence of decline in wall motion segment and no further cardiac testing indicated at this time. Patient's troponin did peak at 221, but likely due to demand ischemia in the setting of sepsis.  - Continue aspirin  81 mg daily - Continue Lipitor  80 mg daily - Continue Plavix  75 mg daily   #Syncope, resolved Likely in the setting of sepsis.  Have successfully volume resuscitated patient.  No obvious signs of arrhythmia.  No acute concerns at this time.  #Hypokalemia Low K at 3.3 up from 3.1 yesterday. Magnesium  at 1.9. Will replete and check in the AM.   #COPD No acute concern for exacerbation at this time -Continue albuterol  PRN   #Insomnia Will trial  trazodone   Diet: Heart Healthy IVF: None,None VTE: Enoxaparin  Code: Full PT/OT recs: Home Health PT   Dispo: Anticipated discharge to Home in 1 day  Libby Blanch DO Internal Medicine Resident PGY-2 Please contact the on call pager after 5  pm and on weekends at [phone]

## 2024-05-15 NOTE — Progress Notes (Signed)
 Mobility Specialist Progress Note:    05/15/24 1440  Mobility  Activity Ambulated with assistance  Level of Assistance Standby assist, set-up cues, supervision of patient - no hands on  Assistive Device None  Distance Ambulated (ft) 200 ft  Activity Response Tolerated fair  Mobility Referral Yes  Mobility visit 1 Mobility  Mobility Specialist Start Time (ACUTE ONLY) 1440  Mobility Specialist Stop Time (ACUTE ONLY) 1450  Mobility Specialist Time Calculation (min) (ACUTE ONLY) 10 min   Pt agreeable to ambulate to perform o2 sats test. No c/o any symptoms. Pt able to move and ambulate well w/o assist. Towards end of walk, notice slight SOB, 2L [place] recovered quickly. Left pt on EOB w/ Xray and all needs met.   Venetia Keel Mobility Specialist Please Neurosurgeon or Rehab Office at [phone]

## 2024-05-15 NOTE — Progress Notes (Signed)
 Nurse requested Mobility Specialist to perform oxygen saturation test with pt which includes removing pt from oxygen both at rest and while ambulating.  Below are the results from that testing.     Patient Saturations on Room Air at Rest = spO2 95%  Patient Saturations on Room Air while Ambulating = sp02 85% . Patient Saturations on 3 Liters of oxygen while Ambulating = sp02 92%  At end of testing pt left in room on 2  Liters of oxygen.  Reported results to nurse.

## 2024-05-16 DIAGNOSIS — A4151 Sepsis due to Escherichia coli [E. coli]: Secondary | ICD-10-CM | POA: Diagnosis not present

## 2024-05-16 LAB — BASIC METABOLIC PANEL WITH GFR
Anion gap: 9 (ref 5–15)
BUN: <5 mg/dL — ABNORMAL LOW (ref 8–23)
CO2: 25 mmol/L (ref 22–32)
Calcium: 8.3 mg/dL — ABNORMAL LOW (ref 8.9–10.3)
Chloride: 106 mmol/L (ref 98–111)
Creatinine, Ser: 0.95 mg/dL (ref 0.61–1.24)
GFR, Estimated: >60 mL/min (ref >60)
Glucose, Bld: 118 mg/dL — ABNORMAL HIGH (ref 70–99)
Potassium: 3.4 mmol/L — ABNORMAL LOW (ref 3.5–5.1)
Sodium: 140 mmol/L (ref 135–145)

## 2024-05-16 LAB — MAGNESIUM: Magnesium: 2.1 mg/dL (ref 1.7–2.4)

## 2024-05-16 MED ORDER — ENOXAPARIN SODIUM 120 MG/0.8ML IJ SOSY: 1.5000 mg/kg | PREFILLED_SYRINGE | INTRAMUSCULAR | Status: DC

## 2024-05-16 MED ORDER — FUROSEMIDE 10 MG/ML IJ SOLN
40.0000 mg | Freq: Once | INTRAMUSCULAR | Status: AC
  Administered 2024-05-16: 40 mg via INTRAVENOUS
  Filled 2024-05-16: qty 4

## 2024-05-16 MED ORDER — ENOXAPARIN SODIUM 40 MG/0.4ML IJ SOSY
40.0000 mg | PREFILLED_SYRINGE | Freq: Every day | INTRAMUSCULAR | Status: DC
  Administered 2024-05-16: 40 mg via SUBCUTANEOUS
  Filled 2024-05-16: qty 0.4

## 2024-05-16 NOTE — Progress Notes (Signed)
 Physical Therapy Treatment Patient Details Name: Donald Leblanc MRN: [id number] DOB: 05-23-1956 Today's Date: 05/16/2024   History of Present Illness 68 yo male presents to [facility] on 8/22 with syncope. Workup for sepsis due to e. Coli bacteremia. PMH includes CAD s/p CABG 12/2022, HTN, sciatica, heart failure.    PT Comments  The pt was able to make good progress with ambulation distance, but demos continued need for O2 with exertion. Pt with SpO2 of 92% on RA at rest, but dropped to 86% on RA after 150 ft requiring 2L to recover to 90s. Once on 2L, maintained >91% with gait. Will continue to benefit from skilled PT to progress functional endurance and dynamic stability, recommendations remain appropriate.     If plan is discharge home, recommend the following: A little help with walking and/or transfers;Assistance with cooking/housework;Assist for transportation   Can travel by private vehicle        Equipment Recommendations  Other (comment) (oxygen)    Recommendations for Other Services       Precautions / Restrictions Precautions Precautions: Fall Recall of Precautions/Restrictions: Intact Precaution/Restrictions Comments: watch SpO2, needing 2L this session Restrictions Weight Bearing Restrictions Per Provider Order: No     Mobility  Bed Mobility Overal bed mobility: Modified Independent             General bed mobility comments: lightly used rails. HOB slightly up    Transfers Overall transfer level: Needs assistance Equipment used: None Transfers: Sit to/from Stand Sit to Stand: Contact guard assist           General transfer comment: CGA for safety, no assist provided    Ambulation/Gait Ambulation/Gait assistance: Contact guard assist, Min assist Gait Distance (Feet): 250 Feet Assistive device: None Gait Pattern/deviations: Step-through pattern, Decreased stride length, Trunk flexed Gait velocity: decreased Gait velocity interpretation: <1.31 ft/sec,  indicative of household ambulator   General Gait Details: x3 standing rest breaks to check SpO2, reports no SOB but SpO2 to 86% on RA after 150 ft. maintained >91% on 2L      Balance Overall balance assessment: Needs assistance Sitting-balance support: Single extremity supported, Feet supported Sitting balance-Leahy Scale: Good     Standing balance support: No upper extremity supported, During functional activity Standing balance-Leahy Scale: Fair Standing balance comment: no UE support, no LOB, will benefit from further challenge/testing                            Communication Communication Communication: No apparent difficulties  Cognition Arousal: Alert Behavior During Therapy: WFL for tasks assessed/performed   PT - Cognitive impairments: No apparent impairments                         Following commands: Intact      Cueing Cueing Techniques: Verbal cues  Exercises      General Comments General comments (skin integrity, edema, etc.): Resting on RA at 92%, 86% on RA after 150 ft ambulation, maintained >91% on 2L      Pertinent Vitals/Pain Pain Assessment Pain Assessment: No/denies pain Pain Intervention(s): Monitored during session     PT Goals (current goals can now be found in the care plan section) Acute Rehab PT Goals Patient Stated Goal: to return home and improve mobility PT Goal Formulation: With patient Time For Goal Achievement: 05/29/24 Potential to Achieve Goals: Good Progress towards PT goals: Progressing toward goals    Frequency  Min 2X/week       AM-PAC PT 6 Clicks Mobility   Outcome Measure  Help needed turning from your back to your side while in a flat bed without using bedrails?: None Help needed moving from lying on your back to sitting on the side of a flat bed without using bedrails?: None Help needed moving to and from a bed to a chair (including a wheelchair)?: A Little Help needed standing up from  a chair using your arms (e.g., wheelchair or bedside chair)?: A Little Help needed to walk in hospital room?: A Little Help needed climbing 3-5 steps with a railing? : A Lot 6 Click Score: 19    End of Session Equipment Utilized During Treatment: Gait belt;Oxygen Activity Tolerance: Patient tolerated treatment well Patient left: in bed;with call bell/phone within reach;with bed alarm set Nurse Communication: Mobility status PT Visit Diagnosis: Other abnormalities of gait and mobility (R26.89);Unsteadiness on feet (R26.81);Muscle weakness (generalized) (M62.81)     Time: 8892-8873 PT Time Calculation (min) (ACUTE ONLY): 19 min  Charges:    $Gait Training: 8-22 mins PT General Charges $$ ACUTE PT VISIT: 1 Visit                     Izetta Call, PT, DPT   Acute Rehabilitation Department Office [phone] Secure Chat Communication Preferred   Izetta JULIANNA Call 05/16/2024, 1:27 PM

## 2024-05-16 NOTE — Progress Notes (Signed)
 SATURATION QUALIFICATIONS: (This note is used to comply with regulatory documentation for home oxygen)  Patient Saturations on Room Air at Rest = 92%  Patient Saturations on Room Air while Ambulating = 86%  Patient Saturations on 2 Liters of oxygen while Ambulating = 91%  Please briefly explain why patient needs home oxygen: Pt unable to maintain SpO2 >90% on less than 2L O2 while ambulating.   Izetta Call, PT, DPT   Acute Rehabilitation Department Office [phone] Secure Chat Communication Preferred

## 2024-05-16 NOTE — Progress Notes (Addendum)
 HD#4 Subjective:   Summary: This is 68 year old male of HTN, HLD, CAD s/p CABG 2024, HFrEF who presented with concerns for a syncopal episode, found to have sepsis with e coli bacteriemia.   Overnight Events: No overnight events   Patient stated that he was doing well this morning. He feels that he does fine with resting. Was able to turn off his oxygen and he was saturating low to mid 90s and does well. Patient states that he does not want to go home on oxygen as it would affect his quality of life.   Objective:  Vital signs in last 24 hours: Vitals:   05/16/24 1149 05/16/24 1155 05/16/24 1315 05/16/24 1317  BP:  (!) 175/88    Pulse: 82 86 66 78  Resp: 18 19 20 16   Temp:  (!) 97 F (36.1 C)    TempSrc:  Axillary    SpO2: 92% 92% 92% 93%  Weight:      Height:       Supplemental O2: Nasal Cannula SpO2: 93 % O2 Flow Rate (L/min): 1 L/min   Physical Exam:  Constitutional: Sitting  in bed, no acute distress HENT: normocephalic atraumatic Cardiovascular: regular rate and rhythm, no m/r/g Pulmonary:fine crackles heard to bibasilar lung fields posteriorly in lower and mid lung fields  Abdominal: soft, non-tender, non-distended Ext: No edema noted to the bilateral lower extremities    Filed Weights   05/14/24 0652 05/15/24 0425 05/16/24 0351  Weight: 77.3 kg 77 kg 76.5 kg     Intake/Output Summary (Last 24 hours) at 05/16/2024 1439 Last data filed at 05/16/2024 1325 Gross per 24 hour  Intake 622 ml  Output 4200 ml  Net -3578 ml   Net IO Since Admission: -5,628.21 mL [05/16/24 1439]  Pertinent Labs:    Latest Ref Rng & Units 05/15/2024    9:29 AM 05/14/2024    2:39 AM 05/13/2024    3:08 AM  CBC  WBC 4.0 - 10.5 K/uL 8.4  14.6  15.0   Hemoglobin 13.0 - 17.0 g/dL 88.8  88.2  88.8   Hematocrit 39.0 - 52.0 % 35.0  37.2  35.3   Platelets 150 - 400 K/uL 303  291  261        Latest Ref Rng & Units 05/16/2024    7:59 AM 05/15/2024    9:29 AM 05/14/2024    2:39 AM   CMP  Glucose 70 - 99 mg/dL 881  880  877   BUN 8 - 23 mg/dL 5  5  5    Creatinine 0.61 - 1.24 mg/dL 9.04  9.01  9.00   Sodium 135 - 145 mmol/L 140  140  139   Potassium 3.5 - 5.1 mmol/L 3.4  3.3  3.1   Chloride 98 - 111 mmol/L 106  106  105   CO2 22 - 32 mmol/L 25  22  26    Calcium  8.9 - 10.3 mg/dL 8.3  8.2  8.2     Imaging: DG CHEST PORT 1 VIEW Result Date: 05/15/2024 CLINICAL DATA:  Shortness of breath EXAM: PORTABLE CHEST 1 VIEW COMPARISON:  05/12/2024 FINDINGS: Cardiac shadow is stable. Postsurgical changes are again noted. Lungs are well aerated bilaterally. Stable scarring is noted in the right upper lobe. Mild central vascular congestion is noted. No effusion is seen. No bony abnormality is noted. IMPRESSION: Mild vascular congestion. Stable right apical scarring. Electronically Signed   By: Oneil Devonshire M.D.   On: 05/15/2024 19:01  Assessment/Plan:   Principal Problem:   Severe sepsis (HCC) Active Problems:   CAD (coronary artery disease)   Syncope   AKI (acute kidney injury) [facility])   Patient Summary: Donald Leblanc is a 68 y.o. male of HTN, HLD, CAD s/p CABG 2024, HFrEF who presented with concerns for a syncopal episode, found to have sepsis with e coli bacteriemia.   #Severe sepsis, improving #E. coli bacteremia Urine culture showing no growth.  E coli sensitives show that patient is pan sensitive. Transitioned to cefadroxil  today.  Unclear source at this time.  Leukocytosis resolved today.  Patient has been afebrile.   - transitioned to  PO Cefadroxil  1000 mg BID. This is Day 4 of abx. Course is 7 days.  - monitor fever curve   #acute hypoxic respiratory failure #New oxygen requirement Patient developed oxygen requirement on Day 2 of hospitalization. Was initially thought to be due to fluid resuscitation in the setting of sepsis.  Patient was given Lasix  40 mg day before yesterday, he has a home dose of Lasix  40 mg.  Has fine crackles on exam.  Chest x-ray from  yesterday did show mild vascular congestion.  Will trial IV Lasix  diuresis today and see if this improves respiratory status.  This is likely due to exacerbation may be in the setting of fluid resuscitation for sepsis.  Patient requires 2 L of oxygen for ambulation but is able to saturate well on room air at rest.  Will see what urine output is and we do oxygen saturations after administration of Lasix . - trial IV lasix  40 mg , check Is &Os  - Continue to work with PT/OT  -Will assess oxygen needs tomorrow and can place DME oxygen order if still needed  - Pulmonary hygiene ordered with chest physiotherapy, flutter valve and IS  #HFrEF Echo yesterday EF of 35%.  Home medications include Lasix  40 mg daily, Imdur  30 mg daily,Entresto  49-51 mg daily.   - Resume home Entresto  49-51 mg twice daily - Hold home Imdur  30 mg daily - Beta blocker not indicated as patient has history of heart block in the past   #CAD status post CABG April 2024 Cardiology was consulted for new wall motion abnormalities seen in inferior wall. They said overall mild relative improvement in wall motion but still has a degree of global hypokinesis. Overall echo did not show any acute evidence of decline in wall motion segment and no further cardiac testing indicated at this time. Patient's troponin did peak at 221, but likely due to demand ischemia in the setting of sepsis.  - Continue aspirin  81 mg daily - Continue Lipitor  80 mg daily - Continue Plavix  75 mg daily   #Syncope, resolved Likely in the setting of sepsis.  Have successfully volume resuscitated patient.  No obvious signs of arrhythmia.  No acute concerns at this time.  #Hypokalemia K at 3.4 today. Magnesium  at 2.1. Will replete and check in the AM.   #COPD No acute concern for exacerbation at this time -Continue albuterol  PRN   #Insomnia -continue trazodone    Diet: Heart Healthy IVF: None,None VTE: Enoxaparin  Code: Full PT/OT recs: Home Health PT    Dispo: Anticipated discharge to Home in 1 day  Libby Blanch DO Internal Medicine Resident PGY-2 Please contact the on call pager after 5 pm and on weekends at [phone]

## 2024-05-16 NOTE — TOC CM/SW Note (Addendum)
 Transition of Care [facility]) - Inpatient Brief Assessment   Patient Details  Name: Donald Leblanc MRN: [id number] Date of Birth: 12-Sep-1956  Transition of [facility]) CM/SW Contact:    Donald Barnie Rama, RN Phone Number: 05/16/2024, 4:01 PM   Clinical Narrative: From home with friend, has PCP, Donald Leblanc with [facility] and insurance on file, states has no HH services in place at this time or DME at home.  States family member  (friend- Donald Leblanc) will transport them home at [employer] and family is support system, states gets medications from [facility] on [address].   Pta self ambulatory.  Per pt eval rec HHPT.  NCM offered choice, he does not have a preference,  NCM made referral to [facility] with [facility] for HHPT.  Soc will begin 24 to 48 hrs post dc.  Patient states he would like to have a small portable oxygen tank to go home with, NCM informed him that not sure can get that to go home with , he may have to qualify from home.  NCM contacted Rosina with [employer] to see if he would be able to assist with that.  The last oxygen sat states he does not need any oxygen.  But they will check it again tomorrow.   Transition of Care Asessment: Insurance and Status: Insurance coverage has been reviewed Patient has primary care physician: Yes Home environment has been reviewed: home with friend Prior level of function:: indep Prior/Current Home Services: No current home services Social Drivers of Health Review: SDOH reviewed no interventions necessary Readmission risk has been reviewed: Yes Transition of care needs: transition of care needs identified, TOC will continue to follow

## 2024-05-16 NOTE — Progress Notes (Signed)
 Mobility Specialist Progress Note:    05/16/24 1428  Mobility  Activity Ambulated with assistance  Level of Assistance Standby assist, set-up cues, supervision of patient - no hands on  Assistive Device None  Distance Ambulated (ft) 200 ft  Activity Response Tolerated well  Mobility Referral Yes  Mobility visit 1 Mobility  Mobility Specialist Start Time (ACUTE ONLY) 1428  Mobility Specialist Stop Time (ACUTE ONLY) 1436  Mobility Specialist Time Calculation (min) (ACUTE ONLY) 8 min   Received pt sitting in recliner. Pt feeling a lot better and willing to participate in session. No c/o any symptoms. Returned pt to recliner w/ all needs met.   Venetia Keel Mobility Specialist Please Neurosurgeon or Rehab Office at [phone]

## 2024-05-16 NOTE — Plan of Care (Signed)
  Problem: Clinical Measurements: Goal: Will remain free from infection Outcome: Progressing   Problem: Clinical Measurements: Goal: Diagnostic test results will improve Outcome: Progressing   Problem: Education: Goal: Knowledge of General Education information will improve Description: Including pain rating scale, medication(s)/side effects and non-pharmacologic comfort measures Outcome: Progressing   Problem: Clinical Measurements: Goal: Respiratory complications will improve Outcome: Progressing   Problem: Clinical Measurements: Goal: Cardiovascular complication will be avoided Outcome: Progressing   Problem: Activity: Goal: Risk for activity intolerance will decrease Outcome: Progressing   Problem: Pain Managment: Goal: General experience of comfort will improve and/or be controlled Outcome: Progressing   Problem: Elimination: Goal: Will not experience complications related to urinary retention Outcome: Progressing   Problem: Skin Integrity: Goal: Risk for impaired skin integrity will decrease Outcome: Progressing

## 2024-05-16 NOTE — Progress Notes (Signed)
 SATURATION QUALIFICATIONS: (This note is used to comply with regulatory documentation for home oxygen)  Patient Saturations on Room Air at Rest = 93%  Patient Saturations on Room Air while Ambulating = 91%  Patient Saturations on 0 Liters of oxygen while Ambulating = 90%  Please briefly explain why patient needs home oxygen: Patient maintained low 90s O2 SAT while ambulating. No complaint of shortness of breath. Verdel LOISE Shams, RN

## 2024-05-17 ENCOUNTER — Other Ambulatory Visit (HOSPITAL_COMMUNITY): Payer: Self-pay

## 2024-05-17 LAB — BASIC METABOLIC PANEL WITH GFR
Anion gap: 6 (ref 5–15)
Anion gap: 8 (ref 5–15)
BUN: <5 mg/dL — ABNORMAL LOW (ref 8–23)
BUN: <5 mg/dL — ABNORMAL LOW (ref 8–23)
CO2: 28 mmol/L (ref 22–32)
CO2: 24 mmol/L (ref 22–32)
Calcium: 8.7 mg/dL — ABNORMAL LOW (ref 8.9–10.3)
Calcium: 8.8 mg/dL — ABNORMAL LOW (ref 8.9–10.3)
Chloride: 106 mmol/L (ref 98–111)
Chloride: 106 mmol/L (ref 98–111)
Creatinine, Ser: 0.99 mg/dL (ref 0.61–1.24)
Creatinine, Ser: 0.95 mg/dL (ref 0.61–1.24)
GFR, Estimated: >60 mL/min (ref >60)
GFR, Estimated: >60 mL/min (ref >60)
Glucose, Bld: 107 mg/dL — ABNORMAL HIGH (ref 70–99)
Glucose, Bld: 106 mg/dL — ABNORMAL HIGH (ref 70–99)
Potassium: 2.8 mmol/L — ABNORMAL LOW (ref 3.5–5.1)
Potassium: 3.8 mmol/L (ref 3.5–5.1)
Sodium: 140 mmol/L (ref 135–145)
Sodium: 138 mmol/L (ref 135–145)

## 2024-05-17 LAB — MAGNESIUM: Magnesium: 1.9 mg/dL (ref 1.7–2.4)

## 2024-05-17 MED ORDER — CEFADROXIL 500 MG PO CAPS
1000.0000 mg | ORAL_CAPSULE | Freq: Two times a day (BID) | ORAL | 0 refills | Status: AC
  Filled 2024-05-17: qty 10, 3d supply, fill #0

## 2024-05-17 MED ORDER — POTASSIUM CHLORIDE 10 MEQ/100ML IV SOLN
10.0000 meq | INTRAVENOUS | Status: AC
  Administered 2024-05-17 (×4): 10 meq via INTRAVENOUS
  Filled 2024-05-17 (×4): qty 100

## 2024-05-17 MED ORDER — POTASSIUM CHLORIDE 20 MEQ PO PACK
40.0000 meq | PACK | ORAL | Status: AC
  Administered 2024-05-17 (×2): 40 meq via ORAL
  Filled 2024-05-17 (×2): qty 2

## 2024-05-17 MED ORDER — SPIRONOLACTONE 25 MG PO TABS
25.0000 mg | ORAL_TABLET | Freq: Every day | ORAL | 0 refills | Status: AC
Start: 2024-05-18 — End: ?
  Filled 2024-05-17: qty 14, 14d supply, fill #0

## 2024-05-17 MED ORDER — POTASSIUM CHLORIDE CRYS ER 20 MEQ PO TBCR
20.0000 meq | EXTENDED_RELEASE_TABLET | Freq: Every day | ORAL | 0 refills | Status: AC
  Filled 2024-05-17: qty 7, 7d supply, fill #0

## 2024-05-17 MED ORDER — SPIRONOLACTONE 25 MG PO TABS
25.0000 mg | ORAL_TABLET | Freq: Every day | ORAL | Status: DC
  Administered 2024-05-17: 25 mg via ORAL
  Filled 2024-05-17: qty 1

## 2024-05-17 NOTE — TOC Progression Note (Signed)
 Transition of Care [facility]) - Progression Note    Patient Details  Name: Donald Leblanc MRN: [id number] Date of Birth: June 29, 1956  Transition of Care [facility]) CM/SW Contact  Waddell Barnie Rama, RN Phone Number: 05/17/2024, 10:39 AM  Clinical Narrative:    Per Nurse Tech sats was 91% while walking. So he  does not need home oxygen set up.                     Expected Discharge Plan and Services                                               Social Drivers of Health (SDOH) Interventions SDOH Screenings   Food Insecurity: No Food Insecurity (05/12/2024)  Housing: Low Risk  (05/12/2024)  Transportation Needs: No Transportation Needs (05/12/2024)  Utilities: Not At Risk (05/12/2024)  Alcohol Screen: Low Risk  (04/27/2023)  Financial Resource Strain: Low Risk  (04/27/2023)  Social Connections: Socially Isolated (05/12/2024)  Tobacco Use: Medium Risk (05/12/2024)    Readmission Risk Interventions    05/16/2024    4:00 PM 05/16/2024   11:00 AM  Readmission Risk Prevention Plan  Post Dischage Appt Complete Complete  Medication Screening Complete Complete  Transportation Screening Complete Complete

## 2024-05-17 NOTE — Progress Notes (Signed)
 HD#5 Subjective:   Summary: This is 68 year old male of HTN, HLD, CAD s/p CABG 2024, HFrEF who presented with concerns for a syncopal episode, found to have sepsis with e coli bacteriemia.   Overnight Events: No overnight events   Patient is doing well this morning and stating that he would like to go home.He is on room air   Objective:  Vital signs in last 24 hours: Vitals:   05/16/24 2328 05/17/24 0410 05/17/24 0722 05/17/24 1046  BP: (!) 122/50 (!) 153/96 (!) 164/91 (!) 153/77  Pulse: 65 76 77 83  Resp: 16 17 18  (!) 24  Temp: 98 F (36.7 C) 98.1 F (36.7 C) 97.6 F (36.4 C) 97.8 F (36.6 C)  TempSrc: Axillary Oral Oral Oral  SpO2:  96% 93% 99%  Weight:      Height:       Supplemental O2: Nasal Cannula SpO2: 99 % O2 Flow Rate (L/min): 1 L/min   Physical Exam:  Constitutional: Sitting  in bed, no acute distress HENT: normocephalic atraumatic Cardiovascular: regular rate and rhythm, no m/r/g Pulmonary: Occasional crackles heard in posterior lower lung bases bilaterally, but much improved from yesterday.  Patient was on room air Ext: No edema noted to the bilateral lower extremities    Filed Weights   05/14/24 0652 05/15/24 0425 05/16/24 0351  Weight: 77.3 kg 77 kg 76.5 kg     Intake/Output Summary (Last 24 hours) at 05/17/2024 1534 Last data filed at 05/17/2024 0854 Gross per 24 hour  Intake 596 ml  Output 700 ml  Net -104 ml   Net IO Since Admission: -5,495.21 mL [05/17/24 1534]  Pertinent Labs:    Latest Ref Rng & Units 05/15/2024    9:29 AM 05/14/2024    2:39 AM 05/13/2024    3:08 AM  CBC  WBC 4.0 - 10.5 K/uL 8.4  14.6  15.0   Hemoglobin 13.0 - 17.0 g/dL 88.8  88.2  88.8   Hematocrit 39.0 - 52.0 % 35.0  37.2  35.3   Platelets 150 - 400 K/uL 303  291  261        Latest Ref Rng & Units 05/17/2024    7:03 AM 05/16/2024    7:59 AM 05/15/2024    9:29 AM  CMP  Glucose 70 - 99 mg/dL 892  881  880   BUN 8 - 23 mg/dL <5  <5  5   Creatinine 0.61 -  1.24 mg/dL 9.00  9.04  9.01   Sodium 135 - 145 mmol/L 140  140  140   Potassium 3.5 - 5.1 mmol/L 2.8  3.4  3.3   Chloride 98 - 111 mmol/L 106  106  106   CO2 22 - 32 mmol/L 28  25  22    Calcium  8.9 - 10.3 mg/dL 8.7  8.3  8.2     Imaging: No results found.    Assessment/Plan:   Principal Problem:   Severe sepsis (HCC) Active Problems:   CAD (coronary artery disease)   Syncope   AKI (acute kidney injury) [facility])   Patient Summary: Donald Leblanc is a 68 y.o. male of HTN, HLD, CAD s/p CABG 2024, HFrEF who presented with concerns for a syncopal episode, found to have sepsis with e coli bacteriemia.   #Severe sepsis, improving #E. coli bacteremia Urine culture showing no growth.  E coli sensitives show that patient is pan sensitive. Transitioned to cefadroxil  today.  Unclear source at this time.  Leukocytosis resolved  today.  Patient has been afebrile.   - transitioned to  PO Cefadroxil  1000 mg BID. This is Day 5 of abx. Course is 7 days.  - monitor fever curve   #acute hypoxic respiratory failure #New oxygen requirement Patient was able to work with PT and mobility and along with diuresis was able to have saturations in the 90s while at rest and ambulating without oxygen use.  Will not need to go home using any oxygen - no diuresis today  - Continue to work with PT/OT   - Pulmonary hygiene ordered with chest physiotherapy, flutter valve and IS  #HFrEF Echo showed EF of 35%.  Home medications include Lasix  40 mg daily, Imdur  30 mg daily,Entresto  49-51 mg daily.   - Resume home Entresto  49-51 mg twice daily -Start spironolactone  25 mg daily - Hold home Imdur  30 mg daily - Beta blocker not indicated as patient has history of heart block in the past   #CAD status post CABG April 2024 Patient is not currently having any chest pain. New wall motion abnormalities on echo were not deemed as acute to cardiology and no acute intervention at this time - Continue aspirin  81 mg daily -  Continue Lipitor  80 mg daily - Continue Plavix  75 mg daily   #Syncope, resolved Likely in the setting of sepsis.  Have successfully volume resuscitated patient.  No obvious signs of arrhythmia.  No acute concerns at this time.  #Hypokalemia Potassium at 2.8 this morning. Magnesium  at 2.1.  Will replete with 120 mill equivalents and 10 mill equivalents IV  #COPD No acute concern for exacerbation at this time -Continue albuterol  PRN   #Insomnia -continue trazodone    Diet: Heart Healthy IVF: None,None VTE: Enoxaparin  Code: Full PT/OT recs: Home Health PT   Dispo: Anticipated discharge to Home today  Maie Kesinger D'Mello DO Internal Medicine Resident PGY-1  Please contact the on call pager after 5 pm and on weekends at [phone]

## 2024-05-17 NOTE — Discharge Summary (Signed)
 Name: Donald Leblanc MRN: [id number] DOB: Jan 26, 1956 68 y.o. PCP: [name], MD  Date of Admission: 05/12/2024  1:17 PM Date of Discharge:05/17/2024 Attending Physician: Dr. Jone Dauphin  Discharge Diagnosis: 1. Principal Problem:   Severe sepsis (HCC) Active Problems:   CAD (coronary artery disease)   Syncope   AKI (acute kidney injury) [facility])    Discharge Medications: Allergies as of 05/17/2024       Reactions   Metoprolol      [facility], stopped due to syncope, bradycardia, possible heart block        Medication List     TAKE these medications    acetaminophen  500 MG tablet Commonly known as: TYLENOL  Take 1,500-2,000 mg by mouth 2 (two) times daily as needed for moderate pain (pain score 4-6), fever or headache.   aspirin  81 MG chewable tablet Chew 81 mg by mouth daily.   atorvastatin  80 MG tablet Commonly known as: LIPITOR  Take 80 mg by mouth daily.   cefadroxil  500 MG capsule Commonly known as: DURICEF Take 2 capsules (1,000 mg total) by mouth 2 (two) times daily.   clopidogrel  75 MG tablet Commonly known as: PLAVIX  Take 75 mg by mouth daily.   Entresto  49-51 MG Generic drug: sacubitril -valsartan  Take 1 tablet by mouth 2 (two) times daily.   furosemide  40 MG tablet Commonly known as: Lasix  Take 1 tablet (40 mg total) by mouth as needed (as needed for weight gain 3 lbs overnight or 5 lbs from baseline).   HYDROcodone -acetaminophen  10-325 MG tablet Commonly known as: NORCO Take 1 tablet by mouth 3 (three) times daily.   isosorbide  mononitrate 30 MG 24 hr tablet Commonly known as: IMDUR  Take 30 mg by mouth daily.   metFORMIN 500 MG tablet Commonly known as: GLUCOPHAGE Take 500 mg by mouth daily.   potassium chloride  SA 20 MEQ tablet Commonly known as: KLOR-CON  M Take 1 tablet (20 mEq total) by mouth daily.   spironolactone  25 MG tablet Commonly known as: ALDACTONE  Take 1 tablet (25 mg total) by mouth  daily. Start taking on: May 18, 2024        Disposition and follow-up:   Mr.Jacqueline Mutch was discharged from [facility] in Stable condition.  At the hospital follow up visit please address:  1.   Bacteremia-patient's blood cultures grew E. coli that were pansensitive.  He is being sent home with cefadroxil .  Please ensure that he is taking these medications.  Urine culture was clear, unsure of source.  Patient was hemodynamically stable and afebrile at time of discharge  HFrEF-echo showed EF of 35% and was concerning for new wall motion abnormality seen in the inferior wall.  Cardiology was consulted and noted that echo had mild relative improvement in wall motion but still has a degree of global hypokinesis.  The overall echo did not show any acute evidence of decline and no further cardiac testing was indicated.  His troponin did peak at 221 but likely thought to be due to demand ischemia in the setting of sepsis. patient should follow-up with cardiologist as an outpatient  Potassium-patient was having low potassium likely in the setting of some diuresis.  Seems like patient was on spironolactone  as an outpatient and was discontinued at some point.  Have restarted patient on spironolactone  25 mg for blood pressure management.  Please recheck potassium and assess for need for supplementation  2.  Labs / imaging needed at time of follow-up: BMP within 48 hours  of discharge to monitor potassium     Follow-up Appointments:  Follow-up Information     [facility].   Specialty: Home Health Services Why: Agency will call you to set up the apt times Contact information: [address] [place] [address] [phone]                  Hospital Course by problem list: Donald Leblanc is a 68 y.o. male of HTN, HLD, CAD s/p CABG 2024, HFrEF who presented with concerns for a syncopal episode, found to have sepsis with e coli  bacteriemia and admitted for infectious workup and treatment.   #Severe sepsis, improving #E. coli bacteremia #lactic acidosis Patient had leukocytosis of 15.8 on admission with temperature up to 102.7.  Lactic acid was 3.6 and decreased to 2.9, patient was given fluids in the ED.  Was started on Doxy and metronidazole  and ceftriaxone .  Patient had initial concern for any sexually transmitted infections and was tested for syphilis, HIV, gonorrhea and chlamydia which were all negative.  He was also tested for respiratory viral panel and COVID and flu which were all negative.  Blood cultures were taken and grew E. coli that was pansensitive.  Patient was started on ceftriaxone  and on day 3 transition to cefadroxil  1000 mg twice daily.  Urinalysis showed leukocytes no nitrites and rare bacteria.  Urine culture has not shown any growth.  Could likely be GI or urine source.  Course is for 7 days. patient remained afebrile throughout hospitalization and no leukocytosis noted at time of discharge. Has two days of course left   Syncope HFrEF (echo 04/2023 25-30%) Heart arrhythmia Coronary artery disease Patient presented after fainting and hypovolemic. States he was in normal health prior to fainting episode.  Troponin peaked at 221. Echo was ordered and showed EF of 35% with worsening wall motion abnormalities in the inferior section. Cardiology was consulted, and did not feel that echo changes were acute . Believe that syncopal episode could likely be due to sepsis and elevated troponin due to demand ischemia. Patient was continued on aspirin  81 mg and clopidogrel  75 daily. TSH WNL.   #acute hypoxic respiratory failure  Patient developed oxygen requirement on Day 2 of hospitalization. Was initially thought to be due to fluid resuscitation in the setting of sepsis. Did trial lasix  40 mg twice as CXR showed mild vascular congestion.  Patient had good urine output and improving respiratory status with diuresis.   Patient worked with PT and mobility and was able to wean oxygen requirement down to 0 L as needed. Do not believe this heart failure exacerbation but may just need diuresis due to fluid resuscitation for sepsis protocol    #CAD status post CABG April 2024  Cardiology was consulted for new wall motion abnormalities seen in inferior wall. They said overall mild relative improvement in wall motion but still has a degree of global hypokinesis. Overall echo did not show any acute evidence of decline in wall motion segment and no further cardiac testing indicated at this time. Patient's troponin did peak at 221, but likely due to demand ischemia in the setting of sepsis. Continue aspirin  81, lipitor  80 mg and plavix  75 mg daily   #Hypokalemia Potassium at 2.8 this morning. Magnesium  at 2.1.  Will replete with 120 mill equivalents and 10 mill equivalents IV. Repeat BMP showed potassium of 3.8 and magnesium  of 1.9. Likely in the setting of diuresis    AKI Serum  creatinine 1.36 on admission, baseline of 0.8-0.9.  Possibly due to dehydration in setting of sepsis.  Creatinine 0.99 at the time of discharge.  Discharge Subjective:  Patient is doing well this morning and stating that he would like to go home.He is on room air    Discharge Exam:   BP (!) 153/77 (BP Location: Right Arm)   Pulse 83   Temp 97.8 F (36.6 C) (Oral)   Resp (!) 24   Ht 5' 10.5 (1.791 m)   Wt 76.5 kg   SpO2 99%   BMI 23.86 kg/m  Discharge exam:  Constitutional: Sitting  in bed, no acute distress HENT: normocephalic atraumatic Cardiovascular: regular rate and rhythm, no m/r/g Pulmonary: Occasional crackles heard in posterior lower lung bases bilaterally, but much improved from yesterday.  Patient was on room air Ext: No edema noted to the bilateral lower extremities   Pertinent Labs, Studies, and Procedures:     Latest Ref Rng & Units 05/15/2024    9:29 AM 05/14/2024    2:39 AM 05/13/2024    3:08 AM  CBC  WBC 4.0 -  10.5 K/uL 8.4  14.6  15.0   Hemoglobin 13.0 - 17.0 g/dL 88.8  88.2  88.8   Hematocrit 39.0 - 52.0 % 35.0  37.2  35.3   Platelets 150 - 400 K/uL 303  291  261        Latest Ref Rng & Units 05/17/2024    3:29 PM 05/17/2024    7:03 AM 05/16/2024    7:59 AM  CMP  Glucose 70 - 99 mg/dL 893  892  881   BUN 8 - 23 mg/dL <5  <5  <5   Creatinine 0.61 - 1.24 mg/dL 9.04  9.00  9.04   Sodium 135 - 145 mmol/L 138  140  140   Potassium 3.5 - 5.1 mmol/L 3.8  2.8  3.4   Chloride 98 - 111 mmol/L 106  106  106   CO2 22 - 32 mmol/L 24  28  25    Calcium  8.9 - 10.3 mg/dL 8.8  8.7  8.3     ECHOCARDIOGRAM COMPLETE Result Date: 05/13/2024    ECHOCARDIOGRAM REPORT   Patient Name:   JONATHA GAGEN Date of Exam: 05/13/2024 Medical Rec #:  [id number]       Height:       70.5 in Accession #:    [id number]      Weight:       168.4 lb Date of Birth:  [birth year]      BSA:          1.951 m Patient Age:    67 years        BP:           136/81 mmHg Patient Gender: M               HR:           85 bpm. Exam Location:  Inpatient Procedure: 2D Echo (Both Spectral and Color Flow Doppler were utilized during            procedure). Indications:    syncope  History:        Patient has prior history of Echocardiogram examinations, most                 recent 04/24/2023. Prior CABG, COPD; Risk Factors:Former Smoker.  Sonographer:    Tinnie Barefoot RDCS Referring Phys: [id number] ERIK C HOFFMAN IMPRESSIONS  1. Inferior and  posterior lateral wall hypokinesis . Left ventricular ejection fraction, by estimation, is 35%. The left ventricle has normal function. The left ventricle demonstrates regional wall motion abnormalities (see scoring diagram/findings for description). The left ventricular internal cavity size was mildly dilated. Left ventricular diastolic parameters are consistent with Grade II diastolic dysfunction (pseudonormalization). Elevated left ventricular end-diastolic pressure.  2. Right ventricular systolic function is normal. The  right ventricular size is moderately enlarged. Moderately increased right ventricular wall thickness. There is mildly elevated pulmonary artery systolic pressure.  3. Left atrial size was moderately dilated.  4. Right atrial size was mildly dilated.  5. The mitral valve is abnormal. Mild mitral valve regurgitation. No evidence of mitral stenosis.  6. The aortic valve is tricuspid. There is mild calcification of the aortic valve. There is mild thickening of the aortic valve. Aortic valve regurgitation is not visualized. Aortic valve sclerosis is present, with no evidence of aortic valve stenosis.  7. The inferior vena cava is normal in size with greater than 50% respiratory variability, suggesting right atrial pressure of 3 mmHg. FINDINGS  Left Ventricle: Inferior and posterior lateral wall hypokinesis. Left ventricular ejection fraction, by estimation, is 35%. The left ventricle has normal function. The left ventricle demonstrates regional wall motion abnormalities. Strain was performed and the global longitudinal strain is indeterminate. The left ventricular internal cavity size was mildly dilated. There is no left ventricular hypertrophy. Left ventricular diastolic parameters are consistent with Grade II diastolic dysfunction (pseudonormalization). Elevated left ventricular end-diastolic pressure. Right Ventricle: The right ventricular size is moderately enlarged. Moderately increased right ventricular wall thickness. Right ventricular systolic function is normal. There is mildly elevated pulmonary artery systolic pressure. The tricuspid regurgitant velocity is 2.95 m/s, and with an assumed right atrial pressure of 3 mmHg, the estimated right ventricular systolic pressure is 37.8 mmHg. Left Atrium: Left atrial size was moderately dilated. Right Atrium: Right atrial size was mildly dilated. Pericardium: There is no evidence of pericardial effusion. Mitral Valve: The mitral valve is abnormal. There is mild  thickening of the mitral valve leaflet(s). Mild mitral valve regurgitation. No evidence of mitral valve stenosis. Tricuspid Valve: The tricuspid valve is normal in structure. Tricuspid valve regurgitation is mild . No evidence of tricuspid stenosis. Aortic Valve: The aortic valve is tricuspid. There is mild calcification of the aortic valve. There is mild thickening of the aortic valve. Aortic valve regurgitation is not visualized. Aortic valve sclerosis is present, with no evidence of aortic valve stenosis. Pulmonic Valve: The pulmonic valve was normal in structure. Pulmonic valve regurgitation is not visualized. No evidence of pulmonic stenosis. Aorta: The aortic root is normal in size and structure. Venous: The inferior vena cava is normal in size with greater than 50% respiratory variability, suggesting right atrial pressure of 3 mmHg. IAS/Shunts: No atrial level shunt detected by color flow Doppler. Additional Comments: 3D was performed not requiring image post processing on an independent workstation and was indeterminate.  LEFT VENTRICLE PLAX 2D LVIDd:         5.30 cm     Diastology LVIDs:         4.10 cm     LV e' medial:    4.68 cm/s LV PW:         0.90 cm     LV E/e' medial:  25.6 LV IVS:        0.80 cm     LV e' lateral:   7.83 cm/s LVOT diam:     2.00 cm  LV E/e' lateral: 15.3 LV SV:         50 LV SV Index:   26 LVOT Area:     3.14 cm  LV Volumes (MOD) LV vol d, MOD A2C: 71.5 ml LV vol d, MOD A4C: 81.3 ml LV vol s, MOD A2C: 43.8 ml LV vol s, MOD A4C: 52.8 ml LV SV MOD A2C:     27.7 ml LV SV MOD A4C:     81.3 ml LV SV MOD BP:      28.5 ml RIGHT VENTRICLE            IVC RV Basal diam:  2.70 cm    IVC diam: 1.50 cm RV S prime:     4.79 cm/s TAPSE (M-mode): 1.0 cm LEFT ATRIUM             Index        RIGHT ATRIUM           Index LA diam:        4.70 cm 2.41 cm/m   RA Area:     15.00 cm LA Vol (A2C):   44.7 ml 22.92 ml/m  RA Volume:   39.80 ml  20.40 ml/m LA Vol (A4C):   54.9 ml 28.14 ml/m LA Biplane  Vol: 54.8 ml 28.09 ml/m  AORTIC VALVE LVOT Vmax:   87.30 cm/s LVOT Vmean:  58.200 cm/s LVOT VTI:    0.159 m  AORTA Ao Root diam: 2.90 cm Ao Asc diam:  2.80 cm MITRAL VALVE                TRICUSPID VALVE MV Area (PHT): 5.13 cm     TR Peak grad:   34.8 mmHg MV Decel Time: 148 msec     TR Vmax:        295.00 cm/s MR Peak grad: 75.0 mmHg MR Mean grad: 50.0 mmHg     SHUNTS MR Vmax:      433.00 cm/s   Systemic VTI:  0.16 m MR Vmean:     335.0 cm/s    Systemic Diam: 2.00 cm MV E velocity: 120.00 cm/s MV A velocity: 64.90 cm/s MV E/A ratio:  1.85 Maude Emmer MD Electronically signed by Maude Emmer MD Signature Date/Time: 05/13/2024/11:04:09 AM    Final    DG Chest Port 1 View Result Date: 05/12/2024 CLINICAL DATA:  Weakness. EXAM: PORTABLE CHEST 1 VIEW COMPARISON:  April 24, 2023. FINDINGS: Stable cardiomediastinal silhouette. Sternotomy wires are noted. Left lung is clear. Stable probable scarring seen in right upper lobe. Bony thorax is unremarkable. IMPRESSION: No active disease. Electronically Signed   By: Lynwood Landy Raddle M.D.   On: 05/12/2024 15:20     Discharge Instructions: Discharge Instructions     Diet - low sodium heart healthy   Complete by: As directed    Increase activity slowly   Complete by: As directed        Signed: D'Mello, Makeba Delcastillo, DO 05/17/2024, 5:09 PM

## 2024-05-17 NOTE — Plan of Care (Signed)

## 2024-05-17 NOTE — Discharge Instructions (Addendum)
 You were hospitalized because you had an increased temperature and were found to have some bacteria in your blood.  We gave you some antibiotics in the hospital which she will also have to go home on.  We also gave you some medications to help get some fluid out of your lungs to help you breathe better.  You were able to work with our therapist to help you move around better.  Thank you for allowing us  to be part of your care.   These follow-up with your primary care doctor at the [facility] within a week.Please make sure to get your potassium checked within 48 hours of leaving the hospital   Please note these changes made to your medications:   *Please START taking:  Cefadroxil  1000mg  twice a day( starting tonight on 8/27) Spironolactone  25 mg once a day

## 2024-06-07 ENCOUNTER — Encounter: Payer: Self-pay | Admitting: Internal Medicine

## 2024-08-04 ENCOUNTER — Ambulatory Visit: Admitting: Internal Medicine
